# Patient Record
Sex: Male | Born: 1945
Health system: Southern US, Community
[De-identification: ages and names within clinical notes are randomized; demographics above are authoritative.]

## PROBLEM LIST (undated history)

## (undated) DIAGNOSIS — K750 Abscess of liver: Secondary | ICD-10-CM

## (undated) DIAGNOSIS — D649 Anemia, unspecified: Secondary | ICD-10-CM

## (undated) DIAGNOSIS — J189 Pneumonia, unspecified organism: Secondary | ICD-10-CM

## (undated) DIAGNOSIS — C801 Malignant (primary) neoplasm, unspecified: Secondary | ICD-10-CM

## (undated) DIAGNOSIS — K5720 Diverticulitis of large intestine with perforation and abscess without bleeding: Secondary | ICD-10-CM

## (undated) HISTORY — PX: TONSILLECTOMY: SUR1361

## (undated) SURGERY — OPEN REDUCTION INTERNAL FIXATION (ORIF) TIBIAL PLATEAU
Anesthesia: General | Laterality: Left

---

## 1999-03-20 ENCOUNTER — Ambulatory Visit (HOSPITAL_COMMUNITY): Admission: RE | Admit: 1999-03-20 | Discharge: 1999-03-20 | Payer: Self-pay | Admitting: Family Medicine

## 1999-10-14 ENCOUNTER — Encounter: Admission: RE | Admit: 1999-10-14 | Discharge: 2000-01-12 | Payer: Self-pay | Admitting: Family Medicine

## 2002-01-30 ENCOUNTER — Ambulatory Visit (HOSPITAL_COMMUNITY): Admission: RE | Admit: 2002-01-30 | Discharge: 2002-01-30 | Payer: Self-pay | Admitting: Gastroenterology

## 2010-04-20 DIAGNOSIS — C801 Malignant (primary) neoplasm, unspecified: Secondary | ICD-10-CM

## 2010-04-20 HISTORY — DX: Malignant (primary) neoplasm, unspecified: C80.1

## 2011-01-05 ENCOUNTER — Other Ambulatory Visit (HOSPITAL_COMMUNITY): Payer: Self-pay | Admitting: Urology

## 2011-01-05 DIAGNOSIS — C61 Malignant neoplasm of prostate: Secondary | ICD-10-CM

## 2011-01-26 ENCOUNTER — Ambulatory Visit (HOSPITAL_COMMUNITY)
Admission: RE | Admit: 2011-01-26 | Discharge: 2011-01-26 | Disposition: A | Discharge status: Home / Self Care | Payer: 59 | Source: Ambulatory Visit | Attending: Urology | Admitting: Urology

## 2011-01-26 DIAGNOSIS — C61 Malignant neoplasm of prostate: Secondary | ICD-10-CM

## 2011-01-26 LAB — CREATININE, SERUM: GFR calc non Af Amer: 68 mL/min — ABNORMAL LOW (ref >90)

## 2011-01-26 MED ORDER — GADOBENATE DIMEGLUMINE 529 MG/ML IV SOLN
20.0000 mL | Freq: Once | INTRAVENOUS | Status: AC | PRN
  Administered 2011-01-26: 18 mL via INTRAVENOUS

## 2011-02-05 ENCOUNTER — Encounter: Payer: 59 | Admitting: Oncology

## 2011-02-27 ENCOUNTER — Other Ambulatory Visit: Payer: Self-pay | Admitting: Urology

## 2011-03-27 ENCOUNTER — Encounter (HOSPITAL_COMMUNITY): Payer: Self-pay | Admitting: Pharmacy Technician

## 2011-03-31 ENCOUNTER — Encounter (HOSPITAL_COMMUNITY)
Admission: RE | Admit: 2011-03-31 | Discharge: 2011-03-31 | Disposition: A | Discharge status: Home / Self Care | Payer: Managed Care, Other (non HMO) | Source: Ambulatory Visit | Attending: Urology | Admitting: Urology

## 2011-03-31 ENCOUNTER — Encounter (HOSPITAL_COMMUNITY): Payer: Self-pay

## 2011-03-31 ENCOUNTER — Other Ambulatory Visit: Payer: Self-pay

## 2011-03-31 ENCOUNTER — Ambulatory Visit (HOSPITAL_COMMUNITY)
Admission: RE | Admit: 2011-03-31 | Discharge: 2011-03-31 | Disposition: A | Discharge status: Home / Self Care | Payer: Managed Care, Other (non HMO) | Source: Ambulatory Visit | Attending: Urology | Admitting: Urology

## 2011-03-31 DIAGNOSIS — Z01812 Encounter for preprocedural laboratory examination: Secondary | ICD-10-CM | POA: Insufficient documentation

## 2011-03-31 DIAGNOSIS — Z01810 Encounter for preprocedural cardiovascular examination: Secondary | ICD-10-CM | POA: Insufficient documentation

## 2011-03-31 DIAGNOSIS — Z01818 Encounter for other preprocedural examination: Secondary | ICD-10-CM | POA: Insufficient documentation

## 2011-03-31 HISTORY — DX: Malignant (primary) neoplasm, unspecified: C80.1

## 2011-03-31 LAB — CBC
HCT: 37.2 % — ABNORMAL LOW (ref 39.0–52.0)
Hemoglobin: 13 g/dL (ref 13.0–17.0)
MCH: 29.8 pg (ref 26.0–34.0)
MCHC: 34.9 g/dL (ref 30.0–36.0)
RDW: 13.1 % (ref 11.5–15.5)

## 2011-03-31 LAB — BASIC METABOLIC PANEL
BUN: 13 mg/dL (ref 6–23)
Chloride: 103 mEq/L (ref 96–112)
Creatinine, Ser: 1.06 mg/dL (ref 0.50–1.35)
Glucose, Bld: 186 mg/dL — ABNORMAL HIGH (ref 70–99)
Potassium: 4.7 mEq/L (ref 3.5–5.1)

## 2011-03-31 NOTE — Patient Instructions (Signed)
20 Billy Horn  03/31/2011   Your procedure is scheduled [id number] 04/06/11  AT 11:00 AM  Report to [facility] at 8:30 AM.  Call this number if you have problems the morning of surgery: [phone]   Remember:FOLLOW ANY INSTRUCTIONS FROM DR. BORDEN'S OFFICE ABOUT CLEAR LIQUID DIET AND LAXATIVE--DAY BEFORE YOUR SURGERY   Do not eat food OR DRINK ANYTHING--AFTER MIDNIGHT THE NIGHT BEFORE YOUR SURGERY       Do not wear jewelry, make-up or nail polish.  Do not wear lotions, powders, or perfumes. You may wear deodorant.  Do not shave 48 hours prior to surgery.  Do not bring valuables to the hospital.  Contacts, dentures or bridgework may not be worn into surgery.  Leave suitcase in the car. After surgery it may be brought to your room.  For patients admitted to the hospital, checkout time is 11:00 AM the day of discharge.   Patients discharged the day of surgery will not be allowed to drive home.    Special Instructions: CHG Shower Use Special Wash: 1/2 bottle night before surgery and 1/2 bottle morning of surgery.   Please read over the following fact sheets that you were given: Blood Transfusion Information and MRSA Information

## 2011-04-05 NOTE — H&P (Signed)
Chief Complaint  Prostate Cancer   History of Present Illness     Billy Horn is a 65 year old who was noted to have an elevated PSA of 8.06 which prompted a prostate biopsy by Dr. Patsi Sears on 12/24/10 which confirmed Gleason 3+4=7 adenocarcinoma with 4 out of 12 biopsy cores involved positive for malignancy. He underwent an MRI of the prostate which demonstrated findings worrisome for tumor at the right base and left mid gland with no evidence of extraprostatic extension and while there was some concern at the right seminal vesicle it is likely that these findings were related to post-biopsy hemmorrhage. He is well informed about his treatment options and is here to discuss surgical options for treatment. He has no family history of prostate cancer.   TNM stage: cT1c N0 Mx PSA: 8.06 Gleason score: 3+4=7 Biopsy (12/24/10): 4/12 cores -- L lateral apex (10%, 3+3=6), L lateral mid (15%, 3+4=7), L lateral base (70%, 3+4=7), R lateral apex (20%, 3+4=7) Prostate volume: 68 cc  Nomogram: OC disease: 70% EPE: 19% SVI: 6% LNI: 2.8% PFS (surgery): 91%, 87%  Urinary function: He has minimal voiding symptoms including only mild urinary frequency and nocturia which is not bothersome. IPSS: 4.  Erectile function: He is divorced and has been for 5 years and is not currently sexually active or in a current relationship. He does have moderate to severe erectile dysfunction. SHIM score: 11.   Past Medical History Problems  1. History of  Diabetes Mellitus 250.00   His diabetes is diet controlled.   Surgical History Problems  1. History of  Tonsillectomy  Current Meds 1. Adult Aspirin Low Strength 81 MG CHEW; Therapy: (Recorded:03Jan2012) to 2. Centrum Silver Oral Tablet; Therapy: (Recorded:03Jan2012) to 3. Metamucil CAPS; Therapy: (Recorded:14Sep2012) to 4. Viagra 100 MG Oral Tablet; Therapy: 18Jul2012 to  Allergies Medication  1. No Known Drug Allergies  Family History Problems  1. Family  history of  Lung Cancer V16.1 2. Family history of  Mother Deceased At Age ____ 74/Lung Cancer  Social History Problems    Alcohol Use Wine/4 glasses weekly   Marital History - Single   Never A Smoker   Occupation: Hydrologist  Review of Systems Constitutional, skin, eye, otolaryngeal, hematologic/lymphatic, cardiovascular, pulmonary, endocrine, musculoskeletal, gastrointestinal, neurological and psychiatric system(s) were reviewed and pertinent findings if present are noted.    Vitals  Blood Pressure: 160 / 91 Temperature: 97.8 F Heart Rate: 70  Physical Exam Constitutional: Well nourished and well developed . No acute distress.  ENT:. The ears and nose are normal in appearance.  Neck: The appearance of the neck is normal and no neck mass is present.  Pulmonary: No respiratory distress, normal respiratory rhythm and effort and clear bilateral breath sounds.  Cardiovascular: Heart rate and rhythm are normal . No peripheral edema.  Abdomen: The abdomen is mildly obese. The abdomen is soft and nontender. No masses are palpated. No CVA tenderness. No hernias are palpable. No hepatosplenomegaly noted.  Rectal: Rectal exam demonstrates normal sphincter tone, no tenderness and no masses. Prostate size is estimated to be 50 g. The prostate has no nodularity and is not tender. The left seminal vesicle is nonpalpable. The right seminal vesicle is nonpalpable. The perineum is normal on inspection.  Lymphatics: The femoral and inguinal nodes are not enlarged or tender.  Skin: Normal skin turgor, no visible rash and no visible skin lesions.  Neuro/Psych:. Mood and affect are appropriate.    Results/Data Urine [Data Includes: Last 1 Day]  06Nov2012  COLOR: YELLOW  Reference Range YELLOW APPEARANCE: CLEAR  Reference Range CLEAR SPECIFIC GRAVITY: 1.025  Reference Range 1.005-1.030 pH: 5.5  Reference Range 5.0-8.0 GLUCOSE: NEG mg/dL Reference Range NEG BILIRUBIN: NEG  Reference  Range NEG KETONE: NEG mg/dL Reference Range NEG BLOOD: NEG  Reference Range NEG PROTEIN: NEG mg/dL Reference Range NEG UROBILINOGEN: 0.2 mg/dL Reference Range 6.2-9.5 NITRITE: NEG  Reference Range NEG LEUKOCYTE ESTERASE: NEG  Reference Range NEG   I reviewed his medical records, pathology report, and PSA results. I also independently reviewed his MRI findings as dictated above.   Assessment Assessed  1. Prostate Cancer 185  Plan   1. Prostate cancer:  He will undergo a robotic prostatectomy and BPLND.

## 2011-04-06 ENCOUNTER — Encounter (HOSPITAL_COMMUNITY): Payer: Self-pay | Admitting: *Deleted

## 2011-04-06 ENCOUNTER — Encounter (HOSPITAL_COMMUNITY)
Admission: RE | Disposition: A | Discharge status: Home / Self Care | Payer: Self-pay | Source: Ambulatory Visit | Attending: Urology

## 2011-04-06 ENCOUNTER — Other Ambulatory Visit: Payer: Self-pay | Admitting: Urology

## 2011-04-06 ENCOUNTER — Inpatient Hospital Stay (HOSPITAL_COMMUNITY): Admission: RE | Admit: 2011-04-06 | Payer: Self-pay | Source: Ambulatory Visit

## 2011-04-06 ENCOUNTER — Encounter (HOSPITAL_COMMUNITY): Payer: Self-pay | Admitting: Anesthesiology

## 2011-04-06 ENCOUNTER — Inpatient Hospital Stay (HOSPITAL_COMMUNITY)
Admission: RE | Admit: 2011-04-06 | Discharge: 2011-04-07 | DRG: 708 | Disposition: A | Discharge status: Home / Self Care | Payer: Managed Care, Other (non HMO) | Source: Ambulatory Visit | Attending: Urology | Admitting: Urology

## 2011-04-06 ENCOUNTER — Inpatient Hospital Stay (HOSPITAL_COMMUNITY): Payer: Managed Care, Other (non HMO) | Admitting: Anesthesiology

## 2011-04-06 DIAGNOSIS — E119 Type 2 diabetes mellitus without complications: Secondary | ICD-10-CM | POA: Diagnosis present

## 2011-04-06 DIAGNOSIS — C61 Malignant neoplasm of prostate: Principal | ICD-10-CM | POA: Diagnosis present

## 2011-04-06 HISTORY — PX: ROBOT ASSISTED LAPAROSCOPIC RADICAL PROSTATECTOMY: SHX5141

## 2011-04-06 LAB — GLUCOSE, CAPILLARY
Glucose-Capillary: 111 mg/dL — ABNORMAL HIGH (ref 70–99)
Glucose-Capillary: 203 mg/dL — ABNORMAL HIGH (ref 70–99)
Glucose-Capillary: 286 mg/dL — ABNORMAL HIGH (ref 70–99)

## 2011-04-06 SURGERY — ROBOTIC ASSISTED LAPAROSCOPIC RADICAL PROSTATECTOMY LEVEL 2
Anesthesia: General | Site: Abdomen | Wound class: Clean Contaminated

## 2011-04-06 MED ORDER — HYDROMORPHONE HCL PF 1 MG/ML IJ SOLN
INTRAMUSCULAR | Status: DC | PRN
  Administered 2011-04-06 (×2): 1 mg via INTRAVENOUS

## 2011-04-06 MED ORDER — FENTANYL CITRATE 0.05 MG/ML IJ SOLN
INTRAMUSCULAR | Status: DC | PRN
  Administered 2011-04-06 (×2): 200 ug via INTRAVENOUS
  Administered 2011-04-06: 100 ug via INTRAVENOUS
  Administered 2011-04-06: 50 ug via INTRAVENOUS
  Administered 2011-04-06 (×2): 100 ug via INTRAVENOUS

## 2011-04-06 MED ORDER — INDIGOTINDISULFONATE SODIUM 8 MG/ML IJ SOLN
INTRAMUSCULAR | Status: DC | PRN
  Administered 2011-04-06 (×2): 40 mg via INTRAVENOUS

## 2011-04-06 MED ORDER — ACETAMINOPHEN 325 MG PO TABS: 650.0000 mg | ORAL_TABLET | ORAL | Status: DC | PRN

## 2011-04-06 MED ORDER — ROCURONIUM BROMIDE 100 MG/10ML IV SOLN
INTRAVENOUS | Status: DC | PRN
  Administered 2011-04-06: 70 mg via INTRAVENOUS

## 2011-04-06 MED ORDER — DIPHENHYDRAMINE HCL 50 MG/ML IJ SOLN: 12.5000 mg | Freq: Four times a day (QID) | INTRAMUSCULAR | Status: DC | PRN

## 2011-04-06 MED ORDER — CEFAZOLIN SODIUM-DEXTROSE 2-3 GM-% IV SOLR
2.0000 g | Freq: Once | INTRAVENOUS | Status: AC
  Administered 2011-04-06: 2 g via INTRAVENOUS

## 2011-04-06 MED ORDER — KCL IN DEXTROSE-NACL 20-5-0.45 MEQ/L-%-% IV SOLN
INTRAVENOUS | Status: DC
  Administered 2011-04-06 – 2011-04-07 (×3): via INTRAVENOUS
  Filled 2011-04-06 (×5): qty 1000

## 2011-04-06 MED ORDER — DIPHENHYDRAMINE HCL 12.5 MG/5ML PO ELIX: 12.5000 mg | ORAL_SOLUTION | Freq: Four times a day (QID) | ORAL | Status: DC | PRN

## 2011-04-06 MED ORDER — CEFAZOLIN SODIUM 1-5 GM-% IV SOLN
1.0000 g | Freq: Three times a day (TID) | INTRAVENOUS | Status: AC
  Administered 2011-04-06 – 2011-04-07 (×2): 1 g via INTRAVENOUS
  Filled 2011-04-06 (×2): qty 50

## 2011-04-06 MED ORDER — STERILE WATER FOR IRRIGATION IR SOLN
Status: DC | PRN
  Administered 2011-04-06: 3000 mL

## 2011-04-06 MED ORDER — SODIUM CHLORIDE 0.9 % IR SOLN
Status: DC | PRN
  Administered 2011-04-06: 250 mL

## 2011-04-06 MED ORDER — KETOROLAC TROMETHAMINE 30 MG/ML IJ SOLN
INTRAMUSCULAR | Status: DC | PRN
  Administered 2011-04-06: 30 mg via INTRAVENOUS

## 2011-04-06 MED ORDER — PROPOFOL 10 MG/ML IV EMUL
INTRAVENOUS | Status: DC | PRN
  Administered 2011-04-06: 30 mg via INTRAVENOUS
  Administered 2011-04-06: 200 mg via INTRAVENOUS

## 2011-04-06 MED ORDER — LACTATED RINGERS IV SOLN
INTRAVENOUS | Status: AC
  Administered 2011-04-06 (×2): via INTRAVENOUS
  Administered 2011-04-06: 1000 mL via INTRAVENOUS

## 2011-04-06 MED ORDER — LACTATED RINGERS IV SOLN
INTRAVENOUS | Status: DC | PRN
  Administered 2011-04-06: 12:00:00

## 2011-04-06 MED ORDER — GLYCOPYRROLATE 0.2 MG/ML IJ SOLN
INTRAMUSCULAR | Status: DC | PRN
  Administered 2011-04-06: .8 mg via INTRAVENOUS

## 2011-04-06 MED ORDER — SODIUM CHLORIDE 0.9 % IV BOLUS (SEPSIS)
1000.0000 mL | Freq: Once | INTRAVENOUS | Status: AC
  Administered 2011-04-06: 1000 mL via INTRAVENOUS

## 2011-04-06 MED ORDER — INSULIN ASPART 100 UNIT/ML ~~LOC~~ SOLN
0.0000 [IU] | SUBCUTANEOUS | Status: DC
  Administered 2011-04-06: 8 [IU] via SUBCUTANEOUS
  Administered 2011-04-06: 5 [IU] via SUBCUTANEOUS
  Administered 2011-04-07 (×3): 3 [IU] via SUBCUTANEOUS
  Administered 2011-04-07: 5 [IU] via SUBCUTANEOUS
  Filled 2011-04-06: qty 3

## 2011-04-06 MED ORDER — BUPIVACAINE-EPINEPHRINE 0.25% -1:200000 IJ SOLN
INTRAMUSCULAR | Status: DC | PRN
  Administered 2011-04-06: 30 mL

## 2011-04-06 MED ORDER — HYDROCODONE-ACETAMINOPHEN 5-325 MG PO TABS: 1.0000 | ORAL_TABLET | Freq: Four times a day (QID) | ORAL | Status: AC | PRN

## 2011-04-06 MED ORDER — ONDANSETRON HCL 4 MG/2ML IJ SOLN: 4.0000 mg | INTRAMUSCULAR | Status: DC | PRN

## 2011-04-06 MED ORDER — HYDROMORPHONE HCL PF 1 MG/ML IJ SOLN
0.2500 mg | INTRAMUSCULAR | Status: DC | PRN
  Administered 2011-04-06 (×2): 0.5 mg via INTRAVENOUS

## 2011-04-06 MED ORDER — NEOSTIGMINE METHYLSULFATE 1 MG/ML IJ SOLN
INTRAMUSCULAR | Status: DC | PRN
  Administered 2011-04-06: 5 mg via INTRAVENOUS

## 2011-04-06 MED ORDER — KETOROLAC TROMETHAMINE 15 MG/ML IJ SOLN
15.0000 mg | Freq: Four times a day (QID) | INTRAMUSCULAR | Status: DC
  Administered 2011-04-06 – 2011-04-07 (×3): 15 mg via INTRAVENOUS
  Filled 2011-04-06 (×7): qty 1

## 2011-04-06 MED ORDER — CIPROFLOXACIN HCL 500 MG PO TABS: 500.0000 mg | ORAL_TABLET | Freq: Two times a day (BID) | ORAL | Status: AC

## 2011-04-06 MED ORDER — LIDOCAINE HCL (CARDIAC) 20 MG/ML IV SOLN
INTRAVENOUS | Status: DC | PRN
  Administered 2011-04-06: 100 mg via INTRAVENOUS

## 2011-04-06 MED ORDER — MORPHINE SULFATE 2 MG/ML IJ SOLN: 2.0000 mg | INTRAMUSCULAR | Status: DC | PRN

## 2011-04-06 MED ORDER — ONDANSETRON HCL 4 MG/2ML IJ SOLN
INTRAMUSCULAR | Status: DC | PRN
  Administered 2011-04-06: 4 mg via INTRAVENOUS

## 2011-04-06 MED ORDER — ACETAMINOPHEN 10 MG/ML IV SOLN
INTRAVENOUS | Status: DC | PRN
  Administered 2011-04-06: 1000 mg via INTRAVENOUS

## 2011-04-06 MED ORDER — LABETALOL HCL 5 MG/ML IV SOLN
INTRAVENOUS | Status: DC | PRN
  Administered 2011-04-06 (×3): 5 mg via INTRAVENOUS

## 2011-04-06 MED ORDER — MIDAZOLAM HCL 5 MG/5ML IJ SOLN
INTRAMUSCULAR | Status: DC | PRN
  Administered 2011-04-06: 2 mg via INTRAVENOUS
  Administered 2011-04-06: 1 mg via INTRAVENOUS

## 2011-04-06 MED ORDER — BACITRACIN-NEOMYCIN-POLYMYXIN 400-5-5000 EX OINT: 1.0000 "application " | TOPICAL_OINTMENT | Freq: Three times a day (TID) | CUTANEOUS | Status: DC | PRN

## 2011-04-06 MED ORDER — ZOLPIDEM TARTRATE 5 MG PO TABS: 5.0000 mg | ORAL_TABLET | Freq: Every evening | ORAL | Status: DC | PRN

## 2011-04-06 MED ORDER — DOCUSATE SODIUM 100 MG PO CAPS
100.0000 mg | ORAL_CAPSULE | Freq: Two times a day (BID) | ORAL | Status: DC
  Administered 2011-04-06 – 2011-04-07 (×2): 100 mg via ORAL
  Filled 2011-04-06 (×4): qty 1

## 2011-04-06 MED ORDER — PROMETHAZINE HCL 25 MG/ML IJ SOLN: 6.2500 mg | INTRAMUSCULAR | Status: DC | PRN

## 2011-04-06 SURGICAL SUPPLY — 34 items
CANISTER SUCTION 2500CC (MISCELLANEOUS) ×2 IMPLANT
CATH ROBINSON RED A/P 8FR (CATHETERS) ×2 IMPLANT
CHLORAPREP W/TINT 26ML (MISCELLANEOUS) ×2 IMPLANT
CLIP LIGATING HEM O LOK PURPLE (MISCELLANEOUS) ×4 IMPLANT
CLOTH BEACON ORANGE TIMEOUT ST (SAFETY) ×2 IMPLANT
CORD HIGH FREQUENCY UNIPOLAR (ELECTROSURGICAL) ×2 IMPLANT
COVER SURGICAL LIGHT HANDLE (MISCELLANEOUS) ×2 IMPLANT
COVER TIP SHEARS 8 DVNC (MISCELLANEOUS) ×1 IMPLANT
COVER TIP SHEARS 8MM DA VINCI (MISCELLANEOUS) ×1
CUTTER ECHEON FLEX ENDO 45 340 (ENDOMECHANICALS) ×2 IMPLANT
DECANTER SPIKE VIAL GLASS SM (MISCELLANEOUS) IMPLANT
DRAPE SURG IRRIG POUCH 19X23 (DRAPES) ×2 IMPLANT
DRSG TEGADERM 6X8 (GAUZE/BANDAGES/DRESSINGS) ×4 IMPLANT
ELECT REM PT RETURN 9FT ADLT (ELECTROSURGICAL) ×2
ELECTRODE REM PT RTRN 9FT ADLT (ELECTROSURGICAL) ×1 IMPLANT
GLOVE BIO SURGEON STRL SZ 6.5 (GLOVE) ×2 IMPLANT
GLOVE BIOGEL M STRL SZ7.5 (GLOVE) ×4 IMPLANT
GOWN STRL NON-REIN LRG LVL3 (GOWN DISPOSABLE) ×6 IMPLANT
HOLDER FOLEY CATH W/STRAP (MISCELLANEOUS) ×2 IMPLANT
IV LACTATED RINGERS 1000ML (IV SOLUTION) ×2 IMPLANT
KIT ACCESSORY DA VINCI DISP (KITS) ×1
KIT ACCESSORY DVNC DISP (KITS) ×1 IMPLANT
NDL SAFETY ECLIPSE 18X1.5 (NEEDLE) ×1 IMPLANT
NEEDLE HYPO 18GX1.5 SHARP (NEEDLE) ×1
PACK ROBOT UROLOGY CUSTOM (CUSTOM PROCEDURE TRAY) ×2 IMPLANT
POSITIONER SURGICAL ARM (MISCELLANEOUS) IMPLANT
RELOAD GREEN ECHELON 45 (STAPLE) ×2 IMPLANT
SET TUBE IRRIG SUCTION NO TIP (IRRIGATION / IRRIGATOR) ×2 IMPLANT
SOLUTION ELECTROLUBE (MISCELLANEOUS) ×2 IMPLANT
SPONGE GAUZE 4X4 12PLY (GAUZE/BANDAGES/DRESSINGS) ×2 IMPLANT
SUT VICRYL 0 UR6 27IN ABS (SUTURE) ×4 IMPLANT
SYR 27GX1/2 1ML LL SAFETY (SYRINGE) ×2 IMPLANT
TOWEL OR NON WOVEN STRL DISP B (DISPOSABLE) ×2 IMPLANT
WATER STERILE IRR 1500ML POUR (IV SOLUTION) ×4 IMPLANT

## 2011-04-06 NOTE — Anesthesia Postprocedure Evaluation (Signed)
  Anesthesia Post-op Note  Patient: Billy Horn  Procedure(s) Performed:  ROBOTIC ASSISTED LAPAROSCOPIC RADICAL PROSTATECTOMY LEVEL 2 - Bilateral Pelvic Lymphadenectomy   Patient Location: PACU  Anesthesia Type: General  Level of Consciousness: oriented and sedated  Airway and Oxygen Therapy: Patient Spontanous Breathing and Patient connected to nasal cannula oxygen  Post-op Pain: mild  Post-op Assessment: Post-op Vital signs reviewed, Patient's Cardiovascular Status Stable, Respiratory Function Stable and Patent Airway  Post-op Vital Signs: stable  Complications: No apparent anesthesia complications

## 2011-04-06 NOTE — Anesthesia Preprocedure Evaluation (Signed)
Anesthesia Evaluation  Patient identified by MRN, date of birth, ID band Patient awake    Reviewed: Allergy & Precautions, H&P , NPO status , Patient's Chart, lab work & pertinent test results, reviewed documented beta blocker date and time   Airway Mallampati: II TM Distance: >3 FB Neck ROM: Full    Dental   Pulmonary neg pulmonary ROS,  clear to auscultation        Cardiovascular neg cardio ROS Regular Normal Denies cardiac symptoms   Neuro/Psych Negative Neurological ROS  Negative Psych ROS   GI/Hepatic negative GI ROS, Neg liver ROS,   Endo/Other  Diabetes mellitus-, Type 2Diet controlled  Renal/GU negative Renal ROS   Prostate Cancer    Musculoskeletal negative musculoskeletal ROS (+)   Abdominal   Peds negative pediatric ROS (+)  Hematology negative hematology ROS (+)   Anesthesia Other Findings   Reproductive/Obstetrics negative OB ROS                           Anesthesia Physical Anesthesia Plan  ASA: II  Anesthesia Plan: General   Post-op Pain Management:    Induction: Intravenous  Airway Management Planned: Oral ETT  Additional Equipment:   Intra-op Plan:   Post-operative Plan: Extubation in OR  Informed Consent: I have reviewed the patients History and Physical, chart, labs and discussed the procedure including the risks, benefits and alternatives for the proposed anesthesia with the patient or authorized representative who has indicated his/her understanding and acceptance.     Plan Discussed with: CRNA and Surgeon  Anesthesia Plan Comments:         Anesthesia Quick Evaluation

## 2011-04-06 NOTE — Transfer of Care (Signed)
Immediate Anesthesia Transfer of Care Note  Patient: Billy Horn  Procedure(s) Performed:  ROBOTIC ASSISTED LAPAROSCOPIC RADICAL PROSTATECTOMY LEVEL 2 - Bilateral Pelvic Lymphadenectomy   Patient Location: PACU  Anesthesia Type: General  Level of Consciousness: awake, alert , oriented and patient cooperative  Airway & Oxygen Therapy: Patient Spontanous Breathing and Patient connected to face mask oxygen  Post-op Assessment: Report given to PACU RN and Post -op Vital signs reviewed and stable  Post vital signs: stable  Complications: No apparent anesthesia complications

## 2011-04-06 NOTE — Op Note (Signed)
Preoperative diagnosis: Clinically localized adenocarcinoma of the prostate (clinical stage cT1c Nx Mx)  Postoperative diagnosis: Clinically localized adenocarcinoma of the prostate (clinical stage cT1c Nx Mx)  Procedure:  1. Robotic assisted laparoscopic radical prostatectomy (bilateral nerve sparing) 2. Bilateral robotic assisted laparoscopic pelvic lymphadenectomy  Surgeon: Moody Bruins. M.D.  Assistant: Pecola Leisure, PA  Anesthesia: General  Complications: None  EBL: 50 mL  IVF:  2000 mL crystalloid  Specimens: 1. Prostate and seminal vesicles 2. Right pelvic lymph nodes 3. Left pelvic lymph nodes  Disposition of specimens: Pathology  Drains: 1. 20 Fr coude catheter 2. # 19 Blake pelvic drain  Indication: Billy Horn is a 65 y.o. year old patient with clinically localized prostate cancer.  After a thorough review of the management options for treatment of prostate cancer, he elected to proceed with surgical therapy and the above procedure(s).  We have discussed the potential benefits and risks of the procedure, side effects of the proposed treatment, the likelihood of the patient achieving the goals of the procedure, and any potential problems that might occur during the procedure or recuperation. Informed consent has been obtained.  Description of procedure:  The patient was taken to the operating room and a general anesthetic was administered. He was given preoperative antibiotics, placed in the dorsal lithotomy position, and prepped and draped in the usual sterile fashion. Next a preoperative timeout was performed. A urethral catheter was placed into the bladder and a site was selected near the umbilicus for placement of the camera port. This was placed using a standard open Hassan technique which allowed entry into the peritoneal cavity under direct vision and without difficulty. A 12 mm port was placed and a pneumoperitoneum established. The camera was then  used to inspect the abdomen and there was no evidence of any intra-abdominal injuries or other abnormalities. The remaining abdominal ports were then placed. 8 mm robotic ports were placed in the right lower quadrant, left lower quadrant, and far left lateral abdominal wall. A 5 mm port was placed in the right upper quadrant and a 12 mm port was placed in the right lateral abdominal wall for laparoscopic assistance. All ports were placed under direct vision without difficulty. The surgical cart was then docked.   Utilizing the cautery scissors, the bladder was reflected posteriorly allowing entry into the space of Retzius and identification of the endopelvic fascia and prostate. The periprostatic fat was then removed from the prostate allowing full exposure of the endopelvic fascia. The endopelvic fascia was then incised from the apex back to the base of the prostate bilaterally and the underlying levator muscle fibers were swept laterally off the prostate thereby isolating the dorsal venous complex. The dorsal vein was then stapled and divided with a 45 mm Flex Echelon stapler. Attention then turned to the bladder neck which was divided anteriorly thereby allowing entry into the bladder and exposure of the urethral catheter. The catheter balloon was deflated and the catheter was brought into the operative field and used to retract the prostate anteriorly. The posterior bladder neck was then examined and was divided allowing further dissection between the bladder and prostate posteriorly until the vasa deferentia and seminal vessels were identified. The vasa deferentia were isolated, divided, and lifted anteriorly. The seminal vesicles were dissected down to their tips with care to control the seminal vascular arterial blood supply. These structures were then lifted anteriorly and the space between Denonvillier's fascia and the anterior rectum was developed with a combination  of sharp and blunt dissection. This  isolated the vascular pedicles of the prostate.  The lateral prostatic fascia was then sharply incised allowing release of the neurovascular bundles bilaterally. The vascular pedicles of the prostate were then ligated with Weck clips between the prostate and neurovascular bundles and divided with sharp cold scissor dissection resulting in neurovascular bundle preservation. The neurovascular bundles were then separated off the apex of the prostate and urethra bilaterally.  The urethra was then sharply transected allowing the prostate specimen to be disarticulated. The pelvis was copiously irrigated and hemostasis was ensured. There was no evidence for rectal injury.  Attention then turned to the right pelvic sidewall. The fibrofatty tissue between the external iliac vein, confluence of the iliac vessels, hypogastric artery, and Cooper's ligament was dissected free from the pelvic sidewall with care to preserve the obturator nerve. Weck clips were used for lymphostasis and hemostasis. An identical procedure was performed on the contralateral side and the lymphatic packets were removed for permanent pathologic analysis.  Attention then turned to the urethral anastomosis. A 2-0 Vicryl slip knot was placed between Denonvillier's fascia, the posterior bladder neck, and the posterior urethra to reapproximate these structures. A double-armed 3-0 Monocryl suture was then used to perform a 360 running tension-free anastomosis between the bladder neck and urethra. A new urethral catheter was then placed into the bladder and irrigated. There were no blood clots within the bladder and the anastomosis appeared to be watertight. A #19 Blake drain was then brought through the left lateral 8 mm port site and positioned appropriately within the pelvis. It was secured to the skin with a nylon suture. The surgical cart was then undocked. The right lateral 12 mm port site was closed at the fascial level with a 0 Vicryl suture  placed laparoscopically. All remaining ports were then removed under direct vision. The prostate specimen was removed intact within the Endopouch retrieval bag via the periumbilical camera port site. This fascial opening was closed with two running 0 Vicryl sutures. 0.25% Marcaine was then injected into all port sites and all incisions were reapproximated at the skin level with staples. Sterile dressings were applied. The patient appeared to tolerate the procedure well and without complications. The patient was able to be extubated and transferred to the recovery unit in satisfactory condition.   Moody Bruins MD

## 2011-04-06 NOTE — Progress Notes (Signed)
Patient ID: Billy Horn, male   DOB: 08/18/1945, 65 y.o.   MRN: [id number]  Day of Surgery Subjective: The patient is doing well.  No nausea or vomiting. Pain is adequately controlled.  Objective: Vital signs in last 24 hours: Temp:  [97 F (36.1 C)-98.1 F (36.7 C)] 98.1 F (36.7 C) (12/17 1620) Pulse Rate:  [47-82] 54  (12/17 1620) Resp:  [7-18] 16  (12/17 1620) BP: (140-165)/(57-87) 146/72 mmHg (12/17 1620) SpO2:  [95 %-100 %] 95 % (12/17 1620)  Intake/Output from previous day:   Intake/Output this shift: Total I/O In: 3870 [I.V.:2800; Other:70; IV Piggyback:1000] Out: 500 [Urine:400; Drains:50; Blood:50]  Physical Exam:  General: Alert and oriented. CV: RRR Lungs: Clear bilaterally. GI: Soft, Nondistended. Incisions: Dressings intact. Urine: Clear Extremities: Nontender, no erythema, no edema.  Lab Results:  Basename 04/06/11 1404  HGB 11.9*  HCT 34.0*      Assessment/Plan: POD# 1 s/p robotic prostatectomy.  1) SL IVF 2) Ambulate, Incentive spirometry 3) Transition to oral pain medication 4) Dulcolax suppository 5) D/C pelvic drain 6) Plan for likely discharge later today   Moody Bruins. MD   LOS: 0 days   Odas Ozer,LES 04/06/2011, 5:49 PM

## 2011-04-07 LAB — GLUCOSE, CAPILLARY
Glucose-Capillary: 179 mg/dL — ABNORMAL HIGH (ref 70–99)
Glucose-Capillary: 184 mg/dL — ABNORMAL HIGH (ref 70–99)

## 2011-04-07 LAB — HEMOGLOBIN AND HEMATOCRIT, BLOOD: HCT: 31.7 % — ABNORMAL LOW (ref 39.0–52.0)

## 2011-04-07 MED ORDER — BISACODYL 10 MG RE SUPP
10.0000 mg | Freq: Once | RECTAL | Status: AC
  Administered 2011-04-07: 10 mg via RECTAL
  Filled 2011-04-07: qty 1

## 2011-04-07 MED ORDER — HYDROCODONE-ACETAMINOPHEN 5-325 MG PO TABS: 1.0000 | ORAL_TABLET | Freq: Four times a day (QID) | ORAL | Status: DC | PRN

## 2011-04-07 NOTE — Progress Notes (Signed)
Patient Billy Horn was removed intact with minimal drainage. Went over discharge teaching with patient regarding how to transfer from the leg bag to the overnight bag. Patient did a return demonstration and feels comfortable going home with the catheter. Daris Aristizabal, Joslyn Devon

## 2011-04-07 NOTE — Progress Notes (Signed)
Brief visit with patient.  Discussed CBG elevations post-op which are not unusual.  Advised patient to contact his PCP if he has difficulty controlling his blood glucose after discharge.  Patient says he will.  Patient seems very diligent about his diabetes management.

## 2011-04-07 NOTE — Progress Notes (Signed)
Patient ID: Billy Horn, male   DOB: [birth year], 65 y.o.   MRN: [id number]  1 Day Post-Op Subjective: The patient is doing well.  No nausea or vomiting. Pain is adequately controlled.  Objective: Vital signs in last 24 hours: Temp:  [97 F (36.1 C)-98.3 F (36.8 C)] 97.8 F (36.6 C) (12/18 0505) Pulse Rate:  [47-82] 57  (12/18 0505) Resp:  [7-18] 16  (12/18 0505) BP: (130-165)/(57-87) 135/71 mmHg (12/18 0505) SpO2:  [95 %-100 %] 97 % (12/18 0505) Weight:  [87.091 kg (192 lb)-90.7 kg (199 lb 15.3 oz)] 199 lb 15.3 oz (90.7 kg) (12/17 2200)  Intake/Output from previous day: 12/17 0701 - 12/18 0700 In: 6640 [P.O.:240; I.V.:5230; IV Piggyback:1100] Out: 2820 [Urine:2600; Drains:170; Blood:50] Intake/Output this shift:    Physical Exam:  General: Alert and oriented. CV: RRR Lungs: Clear bilaterally. GI: Soft, Nondistended. Incisions: Dressings intact. Urine: Clear Extremities: Nontender, no erythema, no edema.  Lab Results:  Basename 04/07/11 0451 04/06/11 1404  HGB 11.3* 11.9*  HCT 31.7* 34.0*      Assessment/Plan: POD# 1 s/p robotic prostatectomy.  1) SL IVF 2) Ambulate, Incentive spirometry 3) Transition to oral pain medication 4) Dulcolax suppository 5) D/C pelvic drain 6) Plan for likely discharge later today   Moody Bruins. MD   LOS: 1 day   Brenen Beigel,LES 04/07/2011, 7:18 AM

## 2011-04-07 NOTE — Discharge Summary (Signed)
  Date of admission: 04/06/2011  Date of discharge: 04/07/2011  Admission diagnosis: Prostate Cancer  Discharge diagnosis: Prostate Cancer  History and Physical: For full details, please see admission history and physical. Briefly, Billy Horn is a 65 y.o. gentleman with localized prostate cancer.  After discussing management/treatment options, he elected to proceed with surgical treatment.  Hospital Course: Billy Horn was taken to the operating room on 04/06/2011 and underwent a robotic assisted laparoscopic radical prostatectomy. He tolerated this procedure well and without complications. Postoperatively, he was able to be transferred to a regular hospital room following recovery from anesthesia.  He was able to begin ambulating the night of surgery. He remained hemodynamically stable overnight.  He had excellent urine output with appropriately minimal output from his pelvic drain and his pelvic drain was removed on POD #1.  He was transitioned to oral pain medication, tolerated a clear liquid diet, and had met all discharge criteria and was able to be discharged home later on POD#1.  Laboratory values:  Basename 04/07/11 0451 04/06/11 1404  HGB 11.3* 11.9*  HCT 31.7* 34.0*    Disposition: Home  Discharge instruction: He was instructed to be ambulatory but to refrain from heavy lifting, strenuous activity, or driving. He was instructed on urethral catheter care.  Discharge medications:   Medication List  As of 04/07/2011  1:42 PM   START taking these medications         ciprofloxacin 500 MG tablet   Commonly known as: CIPRO   Take 1 tablet (500 mg total) by mouth 2 (two) times daily. Start day prior to office visit for foley removal      HYDROcodone-acetaminophen 5-325 MG per tablet   Commonly known as: NORCO   Take 1-2 tablets by mouth every 6 (six) hours as needed for pain.         CONTINUE taking these medications         METAMUCIL 0.52 G capsule   Generic drug:  psyllium         STOP taking these medications         aspirin EC 81 MG tablet      multivitamins ther. w/minerals Tabs          Where to get your medications    These are the prescriptions that you need to pick up.   You may get these medications from any pharmacy.         ciprofloxacin 500 MG tablet   HYDROcodone-acetaminophen 5-325 MG per tablet            Followup: He will followup in 1 week for catheter removal and to discuss his surgical pathology results.

## 2011-04-09 ENCOUNTER — Encounter (HOSPITAL_COMMUNITY): Payer: Self-pay | Admitting: Urology

## 2012-07-23 ENCOUNTER — Inpatient Hospital Stay (HOSPITAL_COMMUNITY)
Admission: EM | Admit: 2012-07-23 | Discharge: 2012-07-27 | DRG: 494 | Disposition: A | Discharge status: Home Health | Payer: Managed Care, Other (non HMO) | Attending: Orthopedic Surgery | Admitting: Orthopedic Surgery

## 2012-07-23 ENCOUNTER — Emergency Department (HOSPITAL_COMMUNITY): Payer: Managed Care, Other (non HMO) | Admitting: Anesthesiology

## 2012-07-23 ENCOUNTER — Encounter (HOSPITAL_COMMUNITY): Payer: Self-pay | Admitting: Anesthesiology

## 2012-07-23 ENCOUNTER — Other Ambulatory Visit: Payer: Self-pay | Admitting: Orthopedic Surgery

## 2012-07-23 ENCOUNTER — Emergency Department (HOSPITAL_COMMUNITY): Payer: Managed Care, Other (non HMO)

## 2012-07-23 ENCOUNTER — Encounter (HOSPITAL_COMMUNITY)
Admission: EM | Disposition: A | Discharge status: Home Health | Payer: Self-pay | Source: Home / Self Care | Attending: Orthopedic Surgery

## 2012-07-23 DIAGNOSIS — S82109A Unspecified fracture of upper end of unspecified tibia, initial encounter for closed fracture: Secondary | ICD-10-CM | POA: Diagnosis present

## 2012-07-23 DIAGNOSIS — R5082 Postprocedural fever: Secondary | ICD-10-CM | POA: Diagnosis not present

## 2012-07-23 DIAGNOSIS — H9190 Unspecified hearing loss, unspecified ear: Secondary | ICD-10-CM | POA: Diagnosis present

## 2012-07-23 DIAGNOSIS — Z8546 Personal history of malignant neoplasm of prostate: Secondary | ICD-10-CM

## 2012-07-23 DIAGNOSIS — Z9089 Acquired absence of other organs: Secondary | ICD-10-CM

## 2012-07-23 DIAGNOSIS — Y92009 Unspecified place in unspecified non-institutional (private) residence as the place of occurrence of the external cause: Secondary | ICD-10-CM

## 2012-07-23 DIAGNOSIS — E119 Type 2 diabetes mellitus without complications: Secondary | ICD-10-CM | POA: Diagnosis present

## 2012-07-23 DIAGNOSIS — Z79899 Other long term (current) drug therapy: Secondary | ICD-10-CM

## 2012-07-23 DIAGNOSIS — Z9079 Acquired absence of other genital organ(s): Secondary | ICD-10-CM

## 2012-07-23 DIAGNOSIS — Z7982 Long term (current) use of aspirin: Secondary | ICD-10-CM | POA: Diagnosis not present

## 2012-07-23 DIAGNOSIS — W108XXA Fall (on) (from) other stairs and steps, initial encounter: Secondary | ICD-10-CM | POA: Diagnosis present

## 2012-07-23 DIAGNOSIS — S82142A Displaced bicondylar fracture of left tibia, initial encounter for closed fracture: Secondary | ICD-10-CM

## 2012-07-23 HISTORY — PX: ORIF TIBIA PLATEAU: SHX2132

## 2012-07-23 LAB — COMPREHENSIVE METABOLIC PANEL
ALT: 18 U/L (ref 0–53)
AST: 27 U/L (ref 0–37)
Albumin: 3.5 g/dL (ref 3.5–5.2)
Alkaline Phosphatase: 88 U/L (ref 39–117)
Chloride: 103 mEq/L (ref 96–112)
Potassium: 4 mEq/L (ref 3.5–5.1)
Sodium: 138 mEq/L (ref 135–145)
Total Protein: 7 g/dL (ref 6.0–8.3)

## 2012-07-23 LAB — CBC
MCHC: 34 g/dL (ref 30.0–36.0)
RDW: 13.4 % (ref 11.5–15.5)
WBC: 13.2 10*3/uL — ABNORMAL HIGH (ref 4.0–10.5)

## 2012-07-23 SURGERY — OPEN REDUCTION INTERNAL FIXATION (ORIF) TIBIAL PLATEAU
Anesthesia: General | Laterality: Left | Wound class: Clean

## 2012-07-23 MED ORDER — SODIUM CHLORIDE 0.9 % IR SOLN
Status: DC | PRN
  Administered 2012-07-23

## 2012-07-23 MED ORDER — MIDAZOLAM HCL 5 MG/5ML IJ SOLN
INTRAMUSCULAR | Status: DC | PRN
  Administered 2012-07-23: 2 mg via INTRAVENOUS

## 2012-07-23 MED ORDER — CEFAZOLIN SODIUM-DEXTROSE 2-3 GM-% IV SOLR
2.0000 g | INTRAVENOUS | Status: AC
  Administered 2012-07-23: 2 g via INTRAVENOUS
  Filled 2012-07-23: qty 50

## 2012-07-23 MED ORDER — MORPHINE SULFATE 2 MG/ML IJ SOLN
1.0000 mg | INTRAMUSCULAR | Status: DC | PRN
  Administered 2012-07-23 (×4): 2 mg via INTRAVENOUS
  Filled 2012-07-23 (×4): qty 1

## 2012-07-23 MED ORDER — FENTANYL CITRATE 0.05 MG/ML IJ SOLN
100.0000 ug | Freq: Once | INTRAMUSCULAR | Status: AC
  Administered 2012-07-23: 100 ug via INTRAVENOUS
  Filled 2012-07-23: qty 2

## 2012-07-23 MED ORDER — CEFAZOLIN SODIUM-DEXTROSE 2-3 GM-% IV SOLR
INTRAVENOUS | Status: AC
  Filled 2012-07-23: qty 50

## 2012-07-23 MED ORDER — SUCCINYLCHOLINE CHLORIDE 20 MG/ML IJ SOLN
INTRAMUSCULAR | Status: DC | PRN
  Administered 2012-07-23: 100 mg via INTRAVENOUS

## 2012-07-23 MED ORDER — EPHEDRINE SULFATE 50 MG/ML IJ SOLN
INTRAMUSCULAR | Status: DC | PRN
  Administered 2012-07-23: 7.5 mg via INTRAVENOUS

## 2012-07-23 MED ORDER — LACTATED RINGERS IV SOLN
INTRAVENOUS | Status: DC | PRN
  Administered 2012-07-23 – 2012-07-24 (×2): via INTRAVENOUS

## 2012-07-23 MED ORDER — FENTANYL CITRATE 0.05 MG/ML IJ SOLN
INTRAMUSCULAR | Status: DC | PRN
  Administered 2012-07-23: 100 ug via INTRAVENOUS
  Administered 2012-07-23 – 2012-07-24 (×2): 50 ug via INTRAVENOUS

## 2012-07-23 MED ORDER — PROPOFOL 10 MG/ML IV BOLUS
INTRAVENOUS | Status: DC | PRN
  Administered 2012-07-23: 160 mg via INTRAVENOUS

## 2012-07-23 SURGICAL SUPPLY — 63 items
BAG ZIPLOCK 12X15 (MISCELLANEOUS) ×2 IMPLANT
BANDAGE ELASTIC 4 VELCRO ST LF (GAUZE/BANDAGES/DRESSINGS) ×2 IMPLANT
BANDAGE ELASTIC 6 VELCRO ST LF (GAUZE/BANDAGES/DRESSINGS) ×2 IMPLANT
BANDAGE GAUZE ELAST BULKY 4 IN (GAUZE/BANDAGES/DRESSINGS) ×6 IMPLANT
BIT DRILL 100X2.5XANTM LCK (BIT) ×1 IMPLANT
BIT DRILL CAL (BIT) ×1 IMPLANT
BIT DRL 100X2.5XANTM LCK (BIT) ×1
BLADE SURG 15 STRL LF DISP TIS (BLADE) IMPLANT
BLADE SURG 15 STRL SS (BLADE)
CANISTER SUCTION 2500CC (MISCELLANEOUS) ×2 IMPLANT
CLOTH BEACON ORANGE TIMEOUT ST (SAFETY) ×2 IMPLANT
DRAPE C-ARM 42X72 X-RAY (DRAPES) ×2 IMPLANT
DRAPE C-ARMOR (DRAPES) ×2 IMPLANT
DRAPE ORTHO SPLIT 77X108 STRL (DRAPES) ×2
DRAPE STERI IOBAN 125X83 (DRAPES) ×2 IMPLANT
DRAPE SURG ORHT 6 SPLT 77X108 (DRAPES) ×2 IMPLANT
DRAPE TABLE BACK 44X90 PK DISP (DRAPES) IMPLANT
DRILL BIT 2.5MM (BIT) ×1
DRILL BIT CAL (BIT) ×2
DRSG ADAPTIC 3X8 NADH LF (GAUZE/BANDAGES/DRESSINGS) ×4 IMPLANT
DRSG PAD ABDOMINAL 8X10 ST (GAUZE/BANDAGES/DRESSINGS) ×4 IMPLANT
DURAPREP 26ML APPLICATOR (WOUND CARE) ×2 IMPLANT
ELECT REM PT RETURN 9FT ADLT (ELECTROSURGICAL) ×2
ELECTRODE REM PT RTRN 9FT ADLT (ELECTROSURGICAL) ×1 IMPLANT
GLOVE BIOGEL PI IND STRL 8 (GLOVE) ×3 IMPLANT
GLOVE BIOGEL PI INDICATOR 8 (GLOVE) ×3
GLOVE SURG ORTHO 9.0 STRL STRW (GLOVE) ×2 IMPLANT
GOWN STRL REIN 2XL XLG LVL4 (GOWN DISPOSABLE) ×6 IMPLANT
GOWN STRL REIN XL XLG (GOWN DISPOSABLE) ×2 IMPLANT
IMMOBILIZER KNEE 20 (SOFTGOODS) ×4
IMMOBILIZER KNEE 20 THIGH 36 (SOFTGOODS) ×2 IMPLANT
K-WIRE ACE 1.6X6 (WIRE) ×4
KIT BASIN OR (CUSTOM PROCEDURE TRAY) ×2 IMPLANT
KWIRE ACE 1.6X6 (WIRE) ×2 IMPLANT
MANIFOLD NEPTUNE II (INSTRUMENTS) IMPLANT
NS IRRIG 1000ML POUR BTL (IV SOLUTION) IMPLANT
PACK GENERAL/GYN (CUSTOM PROCEDURE TRAY) IMPLANT
PACK LOWER EXTREMITY WL (CUSTOM PROCEDURE TRAY) ×2 IMPLANT
PENCIL BUTTON HOLSTER BLD 10FT (ELECTRODE) ×2 IMPLANT
PLATE LOCK 5H STD LT PROX TIB (Plate) ×2 IMPLANT
POSITIONER SURGICAL ARM (MISCELLANEOUS) ×2 IMPLANT
SCREW CORTICAL 3.5MM 26MM (Screw) ×2 IMPLANT
SCREW CORTICAL 3.5MM 38MM (Screw) ×2 IMPLANT
SCREW LOCK 3.5X70 DIST TIB (Screw) ×2 IMPLANT
SCREW LOCK 3.5X80 DIST TIB (Screw) ×2 IMPLANT
SCREW LOCK CORT STAR 3.5X30 (Screw) ×2 IMPLANT
SCREW LOCK CORT STAR 3.5X40 (Screw) ×2 IMPLANT
SCREW LOCK CORT STAR 3.5X75 (Screw) ×2 IMPLANT
SCREW LOCK CORT STAR 3.5X85 (Screw) ×2 IMPLANT
SCREW LP 3.5X90MM (Screw) ×2 IMPLANT
SCREW LP 3.5X95MM (Screw) ×2 IMPLANT
SET PAD KNEE POSITIONER (MISCELLANEOUS) ×2 IMPLANT
SPONGE GAUZE 4X4 12PLY (GAUZE/BANDAGES/DRESSINGS) ×2 IMPLANT
STAPLER VISISTAT 35W (STAPLE) ×2 IMPLANT
SUT VIC AB 0 CT1 27 (SUTURE)
SUT VIC AB 0 CT1 27XBRD ANTBC (SUTURE) IMPLANT
SUT VIC AB 0 CT1 36 (SUTURE) ×2 IMPLANT
SUT VIC AB 2-0 CT1 27 (SUTURE) ×2
SUT VIC AB 2-0 CT1 TAPERPNT 27 (SUTURE) ×2 IMPLANT
TOWEL NATURAL 10PK STERILE (DISPOSABLE) ×2 IMPLANT
TOWEL OR NON WOVEN STRL DISP B (DISPOSABLE) ×2 IMPLANT
TRAY FOLEY CATH 14FRSI W/METER (CATHETERS) IMPLANT
WATER STERILE IRR 1500ML POUR (IV SOLUTION) IMPLANT

## 2012-07-23 NOTE — ED Provider Notes (Signed)
I saw and evaluated the patient, reviewed the resident's note and I agree with the findings and plan.   .Face to face Exam:  General:  Awake HEENT:  Atraumatic Resp:  Normal effort Abd:  Nondistended Neuro:No focal weakness    Nelia Shi, MD 07/23/12 2103

## 2012-07-23 NOTE — H&P (Signed)
Billy Horn is an 67 y.o. male.   Chief Complaint: painful left knee HPI: [name] WHO SUSTAINED INJURY TO HIS LEFT KNEE WHILE TRYING TO SLIDE HEAVY FURNITURE DOWN SOME STAIRS WHICH GOT OUT OF CONTROL.  Past Medical History  Diagnosis Date  . Diabetes mellitus     controlled with diet and exercise  . Cancer     prostate cancer    Past Surgical History  Procedure Laterality Date  . Tonsillectomy      as a child  . Robot assisted laparoscopic radical prostatectomy  04/06/2011    Procedure: ROBOTIC ASSISTED LAPAROSCOPIC RADICAL PROSTATECTOMY LEVEL 2;  Surgeon: Billy Mc, MD;  Location: [facility];  Service: Urology;  Laterality: N/A;  Bilateral Pelvic Lymphadenectomy     No family history on file. Social History:  reports that he has never smoked. He has never used smokeless tobacco. He reports that  drinks alcohol. He reports that he does not use illicit drugs.  Allergies: No Known Allergies   (Not in a hospital admission)  No results found for this or any previous visit (from the past 48 hour(s)). Dg Tibia/fibula Left  07/23/2012  *RADIOLOGY REPORT*  Clinical Data: Fall, deformity and fracture of the tibial plateau.  LEFT TIBIA AND FIBULA - 2 VIEW  Comparison: Knee films obtained earlier today.  Findings: Other than the complex and comminuted tibial plateau fracture, minimally displaced fracture of the fibular head identified.  No other injuries are identified involving the tibia or fibula distally.  Soft tissues are unremarkable  IMPRESSION: Complex and comminuted tibial plateau fracture as described on the knee films.  There is a minimally displaced fibular head fracture. No distal fractures are identified.   Original Report Authenticated By: Billy Horn, M.D.    Dg Knee Complete 4 Views Left  07/23/2012  *RADIOLOGY REPORT*  Clinical Data: Leg pain.  LEFT KNEE - COMPLETE 4+ VIEW  Comparison: None  Findings: Lipoma hemarthrosis noted.  There is a comminuted and  depressed fracture involving the lateral tibial plateau.  Fracture fragments are depressed by approximately 1.2 cm.  No dislocation identified.  IMPRESSION:  1.  Comminuted lateral tibial plateau fracture with depression of fracture fragments by 1.2 cm. Due to overlap of bony structures the overall extent of fracture cannot be assessed.  Advise further evaluation with CT of the left knee. 2.  Lipoma hemarthrosis.   Original Report Authenticated By: Billy Horn, M.D.     Review of Systems  Constitutional: Negative.   HENT: Negative.   Eyes: Negative.   Respiratory: Negative.   Cardiovascular: Negative.   Gastrointestinal: Negative.   Genitourinary: Negative.   Musculoskeletal: Positive for joint pain and falls.  Skin: Negative.   Neurological: Positive for sensory change.  Endo/Heme/Allergies: Negative.   Psychiatric/Behavioral: Negative.     There were no vitals taken for this visit. Physical Exam left knee held in semi-flexed position,pulses intact ,left heel hypoesthesia, foot movement is good, left knee diffusely swollen. Left elbow with abrasions ,left ear with abrasions. X-r reveals depressed lateral tibial plateau fracture.  Assessment/Plan Left lateral tibial plateau fracture with depression/plan orif left tibial plateau fracture.  Billy Horn 07/23/2012, 5:59 PM

## 2012-07-23 NOTE — Anesthesia Preprocedure Evaluation (Addendum)
Anesthesia Evaluation  Patient identified by MRN, date of birth, ID band Patient awake    Reviewed: Allergy & Precautions, H&P , NPO status , Patient's Chart, lab work & pertinent test results, reviewed documented beta blocker date and time   History of Anesthesia Complications Negative for: history of anesthetic complications  Airway Mallampati: III TM Distance: <3 FB Neck ROM: full    Dental  (+) Teeth Intact   Pulmonary neg pulmonary ROS,    Pulmonary exam normal       Cardiovascular Exercise Tolerance: Good negative cardio ROS  Rhythm:regular Rate:Normal     Neuro/Psych negative neurological ROS  negative psych ROS   GI/Hepatic negative GI ROS, Neg liver ROS,   Endo/Other  diabetes (diet and exercise), Well Controlled  Renal/GU negative Renal ROS     Musculoskeletal   Abdominal Normal abdominal exam  (+)   Peds  Hematology H/o prostatectomy for prostate CA 12/12   Anesthesia Other Findings Last ate at 1:15 pm, injury at 2:15 pm  Reproductive/Obstetrics negative OB ROS                          Anesthesia Physical Anesthesia Plan  ASA: II  Anesthesia Plan: General ETT and Rapid Sequence   Post-op Pain Management:    Induction:   Airway Management Planned:   Additional Equipment:   Intra-op Plan:   Post-operative Plan:   Informed Consent: I have reviewed the patients History and Physical, chart, labs and discussed the procedure including the risks, benefits and alternatives for the proposed anesthesia with the patient or authorized representative who has indicated his/her understanding and acceptance.   Dental Advisory Given  Plan Discussed with: CRNA and Surgeon  Anesthesia Plan Comments:        Anesthesia Quick Evaluation

## 2012-07-23 NOTE — ED Notes (Signed)
Pt reports trying to move a large piece of furniture down the stairs causing him to fall injuring his left knee, leg, left arm and left side of face.

## 2012-07-23 NOTE — ED Provider Notes (Signed)
History     CSN: [id number]  Arrival date & time 07/23/12  1518   None     Chief Complaint  Patient presents with  . Fall  . Leg Pain  . Facial Swelling    (Consider location/radiation/quality/duration/timing/severity/associated sxs/prior treatment) HPI Patient reports left knee pain following a fall.  He fell down some stairs while helping a friend move a piece of furniture down the stairs.  He denies any dizziness or LOC during or preceding the fall.  States that he hit the left side of his head on the drywall and slid down the stairs and ended with his left leg under the credenza.  His friend helped get his leg out and he reported immediate pain and swelling of the left knee.  Worse with any sort of movement.  Past Medical History  Diagnosis Date  . Diabetes mellitus     controlled with diet and exercise  . Cancer     prostate cancer    Past Surgical History  Procedure Laterality Date  . Tonsillectomy      as a child  . Robot assisted laparoscopic radical prostatectomy  04/06/2011    Procedure: ROBOTIC ASSISTED LAPAROSCOPIC RADICAL PROSTATECTOMY LEVEL 2;  Surgeon: Crecencio Mc, MD;  Location: [facility];  Service: Urology;  Laterality: N/A;  Bilateral Pelvic Lymphadenectomy     No family history on file.  History  Substance Use Topics  . Smoking status: Never Smoker   . Smokeless tobacco: Never Used  . Alcohol Use: Yes     Comment: occas wine      Review of Systems  Constitutional: Negative for fever and chills.  HENT:       Left sided face discomfort  Eyes: Negative.   Respiratory: Negative.   Cardiovascular: Negative.   Gastrointestinal: Negative.   Genitourinary: Negative.   Musculoskeletal: Positive for joint swelling.  Skin: Positive for wound (abrasions to left face, left arm).  Neurological: Negative for dizziness, syncope, weakness and light-headedness.    Allergies  Review of patient's allergies indicates no known allergies.  Home Medications    Current Outpatient Rx  Name  Route  Sig  Dispense  Refill  . aspirin EC 81 MG tablet   Oral   Take 81 mg by mouth every morning.         . Multiple Vitamin (MULTIVITAMIN WITH MINERALS) TABS   Oral   Take 1 tablet by mouth daily.         . psyllium (METAMUCIL) 0.52 G capsule   Oral   Take 1.04 g by mouth daily.            BP 171/89  Pulse 78  Temp(Src) 97.4 F (36.3 C) (Oral)  Resp 18  SpO2 99%  Physical Exam  Constitutional: He is oriented to person, place, and time. He appears well-developed and well-nourished. No distress.  HENT:  Head: Normocephalic.  Small abrasion just anterior to left ear  Eyes: Pupils are equal, round, and reactive to light.  Neck: Normal range of motion. Neck supple.  Cardiovascular: Normal rate.   Pulmonary/Chest: Effort normal.  Abdominal: Soft. There is no tenderness. There is no rebound and no guarding.  Musculoskeletal:       Left knee: He exhibits decreased range of motion and swelling. He exhibits no ecchymosis, no laceration, no erythema and normal alignment. Tenderness found.  Left knee with significant swelling, diffuse tenderness, limited assessment of joint laxity secondary to pain.  Bounding DP pulse.  Movement intact  in ankle and toes.  Sensation grossly intact in LLE to light touch.  Neurological: He is alert and oriented to person, place, and time. He exhibits normal muscle tone.  Skin: Skin is warm and dry. No rash noted. He is not diaphoretic. No erythema.       ED Course  Procedures (including critical care time)  Labs Reviewed - No data to display Dg Tibia/fibula Left  07/23/2012  *RADIOLOGY REPORT*  Clinical Data: Fall, deformity and fracture of the tibial plateau.  LEFT TIBIA AND FIBULA - 2 VIEW  Comparison: Knee films obtained earlier today.  Findings: Other than the complex and comminuted tibial plateau fracture, minimally displaced fracture of the fibular head identified.  No other injuries are identified  involving the tibia or fibula distally.  Soft tissues are unremarkable  IMPRESSION: Complex and comminuted tibial plateau fracture as described on the knee films.  There is a minimally displaced fibular head fracture. No distal fractures are identified.   Original Report Authenticated By: Irish Lack, M.D.    Dg Knee Complete 4 Views Left  07/23/2012  *RADIOLOGY REPORT*  Clinical Data: Leg pain.  LEFT KNEE - COMPLETE 4+ VIEW  Comparison: None  Findings: Lipoma hemarthrosis noted.  There is a comminuted and depressed fracture involving the lateral tibial plateau.  Fracture fragments are depressed by approximately 1.2 cm.  No dislocation identified.  IMPRESSION:  1.  Comminuted lateral tibial plateau fracture with depression of fracture fragments by 1.2 cm. Due to overlap of bony structures the overall extent of fracture cannot be assessed.  Advise further evaluation with CT of the left knee. 2.  Lipoma hemarthrosis.   Original Report Authenticated By: Signa Kell, M.D.      No diagnosis found.    MDM  67 yo otherwise healthy male presenting with left knee pain and swelling following an injury.  X-rays show a comminuted, displaced lateral tibial plateau fracture and minimally displaced fibular head fracture.  No evidence for compartment syndrome.  Morphine q1hr prn for pain control.  Will contact orthopedic surgery.  Dr. Montez Morita to come by and evaluate patient.  Will admit under Dr. Montez Morita for surgical repair.        Phebe Colla, MD 07/23/12 2029

## 2012-07-23 NOTE — ED Notes (Signed)
[id number]<AV> Expected date:<BR> Expected time:<BR> Means of arrival:<BR> Comments:<BR> 67 y.o. Fall; Leg swelling

## 2012-07-24 ENCOUNTER — Encounter (HOSPITAL_COMMUNITY): Payer: Self-pay | Admitting: Orthopedic Surgery

## 2012-07-24 ENCOUNTER — Inpatient Hospital Stay (HOSPITAL_COMMUNITY): Payer: Managed Care, Other (non HMO)

## 2012-07-24 ENCOUNTER — Other Ambulatory Visit: Payer: Self-pay | Admitting: Orthopedic Surgery

## 2012-07-24 ENCOUNTER — Emergency Department (HOSPITAL_COMMUNITY): Payer: Managed Care, Other (non HMO)

## 2012-07-24 DIAGNOSIS — S82109A Unspecified fracture of upper end of unspecified tibia, initial encounter for closed fracture: Secondary | ICD-10-CM | POA: Diagnosis not present

## 2012-07-24 LAB — PROTIME-INR
INR: 1.19 (ref 0.00–1.49)
Prothrombin Time: 14.9 seconds (ref 11.6–15.2)

## 2012-07-24 LAB — GLUCOSE, CAPILLARY

## 2012-07-24 MED ORDER — HYDROMORPHONE 0.3 MG/ML IV SOLN
INTRAVENOUS | Status: DC
  Administered 2012-07-24: 0.999 mg via INTRAVENOUS
  Administered 2012-07-24: 1.19 mg via INTRAVENOUS
  Administered 2012-07-24: 0.799 mg via INTRAVENOUS
  Administered 2012-07-24: 03:00:00 via INTRAVENOUS

## 2012-07-24 MED ORDER — ONDANSETRON HCL 4 MG/2ML IJ SOLN: 4.0000 mg | Freq: Four times a day (QID) | INTRAMUSCULAR | Status: DC | PRN

## 2012-07-24 MED ORDER — ACETAMINOPHEN 500 MG PO TABS
1000.0000 mg | ORAL_TABLET | Freq: Once | ORAL | Status: AC
  Administered 2012-07-24: 1000 mg via ORAL
  Filled 2012-07-24: qty 2

## 2012-07-24 MED ORDER — OXYCODONE-ACETAMINOPHEN 5-325 MG PO TABS
1.0000 | ORAL_TABLET | ORAL | Status: DC | PRN
  Administered 2012-07-24 – 2012-07-25 (×3): 2 via ORAL
  Filled 2012-07-24 (×4): qty 2

## 2012-07-24 MED ORDER — METOCLOPRAMIDE HCL 5 MG/ML IJ SOLN: 5.0000 mg | Freq: Three times a day (TID) | INTRAMUSCULAR | Status: DC | PRN

## 2012-07-24 MED ORDER — KETOROLAC TROMETHAMINE 30 MG/ML IJ SOLN: 15.0000 mg | Freq: Once | INTRAMUSCULAR | Status: DC | PRN

## 2012-07-24 MED ORDER — METOCLOPRAMIDE HCL 10 MG PO TABS: 5.0000 mg | ORAL_TABLET | Freq: Three times a day (TID) | ORAL | Status: DC | PRN

## 2012-07-24 MED ORDER — METHOCARBAMOL 100 MG/ML IJ SOLN
500.0000 mg | Freq: Four times a day (QID) | INTRAVENOUS | Status: DC | PRN
  Filled 2012-07-24: qty 5

## 2012-07-24 MED ORDER — ONDANSETRON HCL 4 MG PO TABS: 4.0000 mg | ORAL_TABLET | Freq: Four times a day (QID) | ORAL | Status: DC | PRN

## 2012-07-24 MED ORDER — MENTHOL 3 MG MT LOZG
1.0000 | LOZENGE | OROMUCOSAL | Status: DC | PRN
  Filled 2012-07-24: qty 9

## 2012-07-24 MED ORDER — WARFARIN SODIUM 5 MG PO TABS
5.0000 mg | ORAL_TABLET | Freq: Once | ORAL | Status: AC
  Administered 2012-07-24: 5 mg via ORAL
  Filled 2012-07-24: qty 1

## 2012-07-24 MED ORDER — LACTATED RINGERS IV SOLN: INTRAVENOUS | Status: DC

## 2012-07-24 MED ORDER — WARFARIN VIDEO: Freq: Once | Status: DC

## 2012-07-24 MED ORDER — DOCUSATE SODIUM 100 MG PO CAPS
100.0000 mg | ORAL_CAPSULE | Freq: Two times a day (BID) | ORAL | Status: DC
  Administered 2012-07-24 – 2012-07-27 (×7): 100 mg via ORAL

## 2012-07-24 MED ORDER — DIPHENHYDRAMINE HCL 50 MG/ML IJ SOLN: 12.5000 mg | Freq: Four times a day (QID) | INTRAMUSCULAR | Status: DC | PRN

## 2012-07-24 MED ORDER — ACETAMINOPHEN 650 MG RE SUPP: 650.0000 mg | Freq: Four times a day (QID) | RECTAL | Status: DC | PRN

## 2012-07-24 MED ORDER — WARFARIN - PHARMACIST DOSING INPATIENT: Freq: Every day | Status: DC

## 2012-07-24 MED ORDER — FENTANYL CITRATE 0.05 MG/ML IJ SOLN: 25.0000 ug | INTRAMUSCULAR | Status: DC | PRN

## 2012-07-24 MED ORDER — ACETAMINOPHEN 10 MG/ML IV SOLN: 1000.0000 mg | Freq: Once | INTRAVENOUS | Status: DC | PRN

## 2012-07-24 MED ORDER — ZOLPIDEM TARTRATE 5 MG PO TABS: 5.0000 mg | ORAL_TABLET | Freq: Every evening | ORAL | Status: DC | PRN

## 2012-07-24 MED ORDER — METHOCARBAMOL 500 MG PO TABS
500.0000 mg | ORAL_TABLET | Freq: Four times a day (QID) | ORAL | Status: DC | PRN
  Administered 2012-07-24 – 2012-07-25 (×4): 500 mg via ORAL
  Filled 2012-07-24 (×4): qty 1

## 2012-07-24 MED ORDER — ACETAMINOPHEN 325 MG PO TABS: 650.0000 mg | ORAL_TABLET | Freq: Four times a day (QID) | ORAL | Status: DC | PRN

## 2012-07-24 MED ORDER — DIPHENHYDRAMINE HCL 12.5 MG/5ML PO ELIX: 12.5000 mg | ORAL_SOLUTION | Freq: Four times a day (QID) | ORAL | Status: DC | PRN

## 2012-07-24 MED ORDER — SODIUM CHLORIDE 0.9 % IV SOLN
INTRAVENOUS | Status: DC
  Administered 2012-07-24: 1000 mL via INTRAVENOUS

## 2012-07-24 MED ORDER — SODIUM CHLORIDE 0.9 % IJ SOLN: 9.0000 mL | INTRAMUSCULAR | Status: DC | PRN

## 2012-07-24 MED ORDER — PHENOL 1.4 % MT LIQD
1.0000 | OROMUCOSAL | Status: DC | PRN
  Filled 2012-07-24: qty 177

## 2012-07-24 MED ORDER — HYDROMORPHONE 0.3 MG/ML IV SOLN
INTRAVENOUS | Status: AC
  Filled 2012-07-24: qty 25

## 2012-07-24 MED ORDER — ACETAMINOPHEN 10 MG/ML IV SOLN
1000.0000 mg | Freq: Four times a day (QID) | INTRAVENOUS | Status: DC
  Administered 2012-07-24 (×3): 1000 mg via INTRAVENOUS
  Filled 2012-07-24 (×3): qty 100

## 2012-07-24 MED ORDER — HYDROMORPHONE HCL PF 1 MG/ML IJ SOLN
INTRAMUSCULAR | Status: DC | PRN
  Administered 2012-07-24 (×2): 1 mg via INTRAVENOUS

## 2012-07-24 MED ORDER — NALOXONE HCL 0.4 MG/ML IJ SOLN: 0.4000 mg | INTRAMUSCULAR | Status: DC | PRN

## 2012-07-24 MED ORDER — CEFAZOLIN SODIUM 1-5 GM-% IV SOLN
1.0000 g | Freq: Four times a day (QID) | INTRAVENOUS | Status: AC
  Administered 2012-07-24 (×2): 1 g via INTRAVENOUS
  Filled 2012-07-24 (×2): qty 50

## 2012-07-24 MED ORDER — COUMADIN BOOK
1.0000 | Freq: Once | Status: AC
  Administered 2012-07-24: 1
  Filled 2012-07-24: qty 1

## 2012-07-24 NOTE — Anesthesia Postprocedure Evaluation (Signed)
  Anesthesia Post-op Note  Patient: Billy Horn  Procedure(s) Performed: Procedure(s): OPEN REDUCTION INTERNAL FIXATION (ORIF) TIBIAL PLATEAU (Left)  Patient Location: PACU  Anesthesia Type:General  Level of Consciousness: awake, alert , oriented and patient cooperative  Airway and Oxygen Therapy: Patient Spontanous Breathing and Patient connected to face mask oxygen  Post-op Pain: none  Post-op Assessment: Post-op Vital signs reviewed and Patient's Cardiovascular Status Stable  Post-op Vital Signs: Reviewed and stable  Complications: No apparent anesthesia complications

## 2012-07-24 NOTE — Transfer of Care (Signed)
Immediate Anesthesia Transfer of Care Note  Patient: Billy Horn  Procedure(s) Performed: Procedure(s): OPEN REDUCTION INTERNAL FIXATION (ORIF) TIBIAL PLATEAU (Left)  Patient Location: PACU  Anesthesia Type:General  Level of Consciousness: awake, sedated and patient cooperative  Airway & Oxygen Therapy: Patient Spontanous Breathing and Patient connected to face mask oxygen  Post-op Assessment: Report given to PACU RN and Post -op Vital signs reviewed and stable  Post vital signs: Reviewed and stable  Complications: No apparent anesthesia complications

## 2012-07-24 NOTE — Evaluation (Signed)
Physical Therapy Evaluation Patient Details Name: Billy Horn MRN: [id number] DOB: 03-14-1946 Today's Date: 07/24/2012 Time: 4098-1191 PT Time Calculation (min): 47 min  PT Assessment / Plan / Recommendation Clinical Impression  67 y.o. male with L tibial plateau fx s/p ORIF 07/23/12. Pt was very active PTA, lives alone but has help prn. 1 flight of stairs to enter apt. TOday he walked 36' with RW and min A. Excellent progress expected. He would benefit from acute PT to maximize safety and independence with mobillity.     PT Assessment  Patient needs continued PT services    Follow Up Recommendations  Home health PT    Does the patient have the potential to tolerate intense rehabilitation      Barriers to Discharge None      Equipment Recommendations  Rolling walker with 5" wheels    Recommendations for Other Services OT consult   Frequency Min 6X/week    Precautions / Restrictions Restrictions Weight Bearing Restrictions: Yes LLE Weight Bearing: Non weight bearing   Pertinent Vitals/Pain *7/10 L knee at rest and with walking, PCA used, ice applied**      Mobility  Bed Mobility Bed Mobility: Supine to Sit Supine to Sit: 4: Min assist Details for Bed Mobility Assistance: for LLE Transfers Transfers: Sit to Stand;Stand to Sit Sit to Stand: 4: Min assist;From bed Stand to Sit: 4: Min assist;To chair/3-in-1;With armrests Details for Transfer Assistance: Pt 90% Ambulation/Gait Ambulation/Gait Assistance: 4: Min guard Assistive device: Rolling walker Ambulation/Gait Assistance Details: 55 Gait Pattern: Step-to pattern General Gait Details: NWB LLE, VCs for positioning in RW    Exercises Total Joint Exercises Ankle Circles/Pumps: AROM;Both;15 reps;Supine Quad Sets: AROM;Both;5 reps;Supine Hip ABduction/ADduction: AAROM;Left;Supine;10 reps Straight Leg Raises: AAROM;Left;10 reps;Supine   PT Diagnosis: Difficulty walking;Acute pain  PT Problem List: Decreased range  of motion;Decreased strength;Decreased activity tolerance;Pain;Decreased mobility PT Treatment Interventions: Gait training;Stair training;Functional mobility training;DME instruction;Therapeutic activities;Therapeutic exercise;Patient/family education   PT Goals Acute Rehab PT Goals PT Goal Formulation: With patient Time For Goal Achievement: 07/31/12 Potential to Achieve Goals: Good Pt will go Supine/Side to Sit: Independently PT Goal: Supine/Side to Sit - Progress: Goal set today Pt will go Sit to Stand: with modified independence PT Goal: Sit to Stand - Progress: Goal set today Pt will Ambulate: >150 feet;with modified independence;with rolling walker PT Goal: Ambulate - Progress: Goal set today Pt will Go Up / Down Stairs: Flight;with min assist;with least restrictive assistive device;with rail(s) PT Goal: Up/Down Stairs - Progress: Goal set today Pt will Perform Home Exercise Program: Independently PT Goal: Perform Home Exercise Program - Progress: Goal set today  Visit Information  Last PT Received On: 07/24/12 Assistance Needed: +1    Subjective Data  Subjective: I'll do whatever it takes to get better.  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, Chief of Staff Communication: No difficulties    Cognition  Cognition Overall Cognitive Status: Appears within functional limits for tasks assessed/performed Arousal/Alertness:  Awake/alert Orientation Level: Appears intact for tasks assessed Behavior During Session: [facility] for tasks performed  Extremity/Trunk Assessment Right Upper Extremity Assessment RUE ROM/Strength/Tone: Within functional levels Left Upper Extremity Assessment LUE ROM/Strength/Tone: Within functional levels Right Lower Extremity Assessment RLE ROM/Strength/Tone: Within functional levels RLE Sensation: WFL - Light Touch RLE Coordination: WFL - gross/fine motor Left Lower Extremity Assessment LLE ROM/Strength/Tone: Deficits LLE ROM/Strength/Tone Deficits: knee in KI, ankle AROm decreased approx 50%, hip strength 2/5 LLE Sensation: WFL - Light Touch LLE Coordination: WFL - gross/fine motor Trunk Assessment Trunk Assessment: Normal   Balance    End of Session PT - End of Session Equipment Utilized During Treatment: Gait belt Activity Tolerance: Patient tolerated treatment well Patient left: in chair;with call bell/phone within reach Nurse Communication: Mobility status  GP     Ralene Bathe Kistler 07/24/2012, 9:07 AM

## 2012-07-24 NOTE — Progress Notes (Signed)
ANTICOAGULATION CONSULT NOTE - Initial Consult  Pharmacy Consult for warfarin Indication: VTE prophylaxis  No Known Allergies  Patient Measurements: Height: 6' (182.9 cm) Weight: 185 lb (83.915 kg) IBW/kg (Calculated) : 77.6 Heparin Dosing Weight:   Vital Signs: Temp: 97.9 F (36.6 C) (04/06 0505) Temp src: Oral (04/06 0505) BP: 177/88 mmHg (04/06 0505) Pulse Rate: 78 (04/06 0505)  Labs:  Recent Labs  07/23/12 1817 07/23/12 2244 07/24/12 0520  HGB 11.6*  --   --   HCT 34.1*  --   --   PLT 229  --   --   LABPROT  --  14.0 14.9  INR  --  1.09 1.19  CREATININE 1.04  --   --     Estimated Creatinine Clearance: 75.7 ml/min (by C-G formula based on Cr of 1.04).   Medical History: Past Medical History  Diagnosis Date  . Diabetes mellitus     controlled with diet and exercise  . Cancer     prostate cancer    Medications:  Prescriptions prior to admission  Medication Sig Dispense Refill  . aspirin EC 81 MG tablet Take 81 mg by mouth every morning.      . Multiple Vitamin (MULTIVITAMIN WITH MINERALS) TABS Take 1 tablet by mouth daily.      . psyllium (METAMUCIL) 0.52 G capsule Take 1.04 g by mouth daily.        Scheduled:  . acetaminophen  1,000 mg Intravenous Q6H  .  ceFAZolin (ANCEF) IV  1 g Intravenous Q6H  . [COMPLETED]  ceFAZolin (ANCEF) IV  2 g Intravenous On Call to OR  . coumadin book  1 each Does not apply Once  . docusate sodium  100 mg Oral BID  . [COMPLETED] fentaNYL  100 mcg Intravenous Once  . HYDROmorphone PCA 0.3 mg/mL   Intravenous Q4H  . warfarin  5 mg Oral ONCE-1800  . [START ON 07/25/2012] warfarin   Does not apply Once  . Warfarin - Pharmacist Dosing Inpatient   Does not apply q1800  . [DISCONTINUED] HYDROmorphone PCA 0.3 mg/mL        Assessment: Patient with warfarin per pharmacy order for VTE prophylaxis. Goal of Therapy:  INR 2-3    Plan:  Start with Coumadin 5 mg tonight. Check PT/INR daily. Provide Coumadin education.    Darlina Guys, Jacquenette Shone Crowford 07/24/2012,5:58 AM

## 2012-07-24 NOTE — Anesthesia Postprocedure Evaluation (Signed)
  Anesthesia Post-op Note  Anesthesia Post Note  Patient: Billy Horn  Procedure(s) Performed: Procedure(s) (LRB): OPEN REDUCTION INTERNAL FIXATION (ORIF) TIBIAL PLATEAU (Left)  Anesthesia type: General  Patient location: PACU  Post pain: Pain level controlled  Post assessment: Post-op Vital signs reviewed  Last Vitals:  BP 175/85 HR 95 SPO2 100% on facemask 02 RR 12  Post vital signs: Reviewed  Level of consciousness: sedated  Complications: No apparent anesthesia complications.

## 2012-07-24 NOTE — Op Note (Signed)
Billy Horn, DRAPEAU NO.:  [id number]  MEDICAL RECORD NO.:  [id number]  LOCATION:  1612                         FACILITY:  [facility]  PHYSICIAN:  Myrtie Neither, MD      DATE OF BIRTH:  1945-07-11  DATE OF PROCEDURE:  07/23/2012 DATE OF DISCHARGE:                              OPERATIVE REPORT   PREOPERATIVE DIAGNOSIS:  Comminuted depressed lateral tibial plateau fracture of the left knee.  POSTOPERATIVE DIAGNOSIS:  Comminuted depressed lateral tibial plateau fracture of the left knee.  ANESTHESIA:  General.  PROCEDURE:  Open reduction and internal fixation with the buttress plate, left tibial plateau.  DESCRIPTION OF PROCEDURE:  The patient was taken to the operating room after given adequate preoperative medications, given general anesthesia and intubated.  Left lower extremity was prepped with DuraPrep and draped in sterile manner.  Tourniquet and Bovie used for hemostasis.  C- arm was used to visualize fracture reduction.  A hockey stick lateral incision was made below the joint line of the lateral tibial plateau area going through the skin and subcutaneous tissue down to the fascia. Soft tissue was subperiosteally elevated from the fracture site of the lateral tibial plateau.  The posterior fragment as well as posterolateral tibial plateau was markedly depressed.  Fracture fragment attachment anteriorly was opened as a window and with use of punch and periosteal elevator, the articular surface was brought back up to normal alignment.  Posterior fragment step-off was also reduced by posterior compression.  Two K-wires were placed across and visualized with use of C-arm in AP, lateral and oblique views.  There was found to be satisfactory reduced.  Next, a lateral tibial plateau buttress plate was then put in place and size 0 locking screws were placed proximally and three screws were placed along the shaft, this was sounded to have very good reduction,  decompression of the fracture site.  Again, this was visualized with use of C-arm.  Copious irrigation was then done.  Wound closure was then done with 0 Vicryl for the fascia, 2-0 for the subcutaneous, and skin staples for the skin.  Compressive bulky dressing was applied followed by knee immobilizer.  The patient tolerated the procedure quite well, went to the recovery room in stable and satisfactory condition.     Myrtie Neither, MD     AC/MEDQ  D:  07/24/2012  T:  07/24/2012  Job:  [id number]

## 2012-07-24 NOTE — Progress Notes (Signed)
Subjective: 1 Day Post-Op Procedure(s) (LRB): OPEN REDUCTION INTERNAL FIXATION (ORIF) TIBIAL PLATEAU (Left) Patient reports pain as 6 on 0-10 scale.    Objective: Vital signs in last 24 hours: Temp:  [97.4 F (36.3 C)-98.7 F (37.1 C)] 98.7 F (37.1 C) (04/06 0600) Pulse Rate:  [76-96] 76 (04/06 0600) Resp:  [12-20] 16 (04/06 1200) BP: (148-191)/(75-101) 148/75 mmHg (04/06 0600) SpO2:  [96 %-99 %] 96 % (04/06 1200) Weight:  [83.915 kg (185 lb)] 83.915 kg (185 lb) (04/06 0405)  Intake/Output from previous day: 04/05 0701 - 04/06 0700 In: 1060 [P.O.:60; I.V.:1000] Out: 300 [Urine:300] Intake/Output this shift: Total I/O In: 480 [P.O.:480] Out: 950 [Urine:950]   Recent Labs  07/23/12 1817  HGB 11.6*    Recent Labs  07/23/12 1817  WBC 13.2*  RBC 3.93*  HCT 34.1*  PLT 229    Recent Labs  07/23/12 1817  NA 138  K 4.0  CL 103  CO2 29  BUN 18  CREATININE 1.04  GLUCOSE 179*  CALCIUM 8.9    Recent Labs  07/23/12 2244 07/24/12 0520  INR 1.09 1.19    Neurologically intact Dorsiflexion/Plantar flexion intact  Assessment/Plan: 1 Day Post-Op Procedure(s) (LRB): OPEN REDUCTION INTERNAL FIXATION (ORIF) TIBIAL PLATEAU (Left) Advance diet Up with therapy  Bud Kaeser F 07/24/2012, 1:20 PM

## 2012-07-25 ENCOUNTER — Inpatient Hospital Stay (HOSPITAL_COMMUNITY): Payer: Managed Care, Other (non HMO)

## 2012-07-25 LAB — GLUCOSE, CAPILLARY
Glucose-Capillary: 167 mg/dL — ABNORMAL HIGH (ref 70–99)
Glucose-Capillary: 191 mg/dL — ABNORMAL HIGH (ref 70–99)

## 2012-07-25 LAB — PROTIME-INR
INR: 1.28 (ref 0.00–1.49)
Prothrombin Time: 15.7 seconds — ABNORMAL HIGH (ref 11.6–15.2)

## 2012-07-25 MED ORDER — OXYCODONE HCL 5 MG PO TABS
5.0000 mg | ORAL_TABLET | ORAL | Status: DC | PRN
  Administered 2012-07-25 – 2012-07-27 (×7): 10 mg via ORAL
  Filled 2012-07-25 (×7): qty 2

## 2012-07-25 MED ORDER — WARFARIN SODIUM 7.5 MG PO TABS
7.5000 mg | ORAL_TABLET | Freq: Once | ORAL | Status: AC
  Administered 2012-07-25: 7.5 mg via ORAL
  Filled 2012-07-25 (×2): qty 1

## 2012-07-25 NOTE — Progress Notes (Signed)
Subjective: 2 Days Post-Op Procedure(s) (LRB): OPEN REDUCTION INTERNAL FIXATION (ORIF) TIBIAL PLATEAU (Left) Patient reports pain as 4 on 0-10 scale.    Objective: Vital signs in last 24 hours: Temp:  [97.9 F (36.6 C)-101.2 F (38.4 C)] 97.9 F (36.6 C) (04/07 0408) Pulse Rate:  [76-103] 76 (04/07 0408) Resp:  [16-22] 20 (04/07 0408) BP: (137-203)/(73-91) 137/77 mmHg (04/07 0408) SpO2:  [88 %-99 %] 96 % (04/07 0408) FiO2 (%):  [39 %] 39 % (04/06 1600)  Intake/Output from previous day: 04/06 0701 - 04/07 0700 In: 1740 [P.O.:1740] Out: 2700 [Urine:2700] Intake/Output this shift: Total I/O In: 360 [P.O.:360] Out: 50 [Urine:50]   Recent Labs  07/23/12 1817  HGB 11.6*    Recent Labs  07/23/12 1817  WBC 13.2*  RBC 3.93*  HCT 34.1*  PLT 229    Recent Labs  07/23/12 1817  NA 138  K 4.0  CL 103  CO2 29  BUN 18  CREATININE 1.04  GLUCOSE 179*  CALCIUM 8.9    Recent Labs  07/24/12 0520 07/25/12 0429  INR 1.19 1.28    Neurologically intact Neurovascular intact Dorsiflexion/Plantar flexion intact  Assessment/Plan: 2 Days Post-Op Procedure(s) (LRB): OPEN REDUCTION INTERNAL FIXATION (ORIF) TIBIAL PLATEAU (Left) Up with therapy D/C IV fluids Discharge to SNF  Doris Gruhn F 07/25/2012, 2:10 PM

## 2012-07-25 NOTE — Progress Notes (Signed)
Clinical Social Work Department CLINICAL SOCIAL WORK PLACEMENT NOTE 07/25/2012  Patient:  RONAL, Billy Horn  Account Number:  [id number] Admit date:  07/23/2012  Clinical Social Worker:  Cori Razor, LCSW  Date/time:  07/25/2012 03:00 PM  Clinical Social Work is seeking post-discharge placement for this patient at the following level of care:   SKILLED NURSING   (*CSW will update this form in Epic as items are completed)   07/25/2012  Patient/family provided with [facility] Department of Clinical Social Work's list of facilities offering this level of care within the geographic area requested by the patient (or if unable, by the patient's family).  07/25/2012  Patient/family informed of their freedom to choose among providers that offer the needed level of care, that participate in Medicare, Medicaid or managed care program needed by the patient, have an available bed and are willing to accept the patient.  07/25/2012  Patient/family informed of [facility]' ownership interest in [facility], as well as of the fact that they are under no obligation to receive care at this facility.  PASARR submitted to [employer] on 07/25/2012 PASARR number received from [employer] on 07/25/2012  FL2 transmitted to all facilities in geographic area requested by pt/family on  07/25/2012 FL2 transmitted to all facilities within larger geographic area on   Patient informed that his/her managed care company has contracts with or will negotiate with  certain facilities, including the following:     Patient/family informed of bed offers received:   Patient chooses bed at  Physician recommends and patient chooses bed at    Patient to be transferred to  on   Patient to be transferred to facility by   The following physician request were entered in Epic:   Additional Comments:  Cori Razor LCSW [phone]

## 2012-07-25 NOTE — Progress Notes (Signed)
ANTICOAGULATION CONSULT NOTE - Follow Up Consult  Pharmacy Consult for Coumadin Indication: VTE prophylaxis  No Known Allergies  Patient Measurements: Height: 6' (182.9 cm) Weight: 185 lb (83.915 kg) IBW/kg (Calculated) : 77.6  Vital Signs: Temp: 97.9 F (36.6 C) (04/07 0408) Temp src: Oral (04/07 0408) BP: 137/77 mmHg (04/07 0408) Pulse Rate: 76 (04/07 0408)  Labs:  Recent Labs  07/23/12 1817 07/23/12 2244 07/24/12 0520 07/25/12 0429  HGB 11.6*  --   --   --   HCT 34.1*  --   --   --   PLT 229  --   --   --   LABPROT  --  14.0 14.9 15.7*  INR  --  1.09 1.19 1.28  CREATININE 1.04  --   --   --     Estimated Creatinine Clearance: 75.7 ml/min (by C-G formula based on Cr of 1.04).    Assessment: 39 yoM with left lateral tibial fx s/p ORIF 4/6. Coumadin ordered post-op for VTE prophylaxis. Coumadin score = 4  INR 1.28 today, no CBC today. No bleeding documented.  Goal of Therapy:  INR 2-3 Monitor platelets by anticoagulation protocol: Yes   Plan:   Coumadin 7.5mg  x 1 tonight  Daily PT/INR  Pharmacy will f/u with Coumadin education today  Geoffry Paradise, PharmD, BCPS Pager: [phone] 8:49 AM Pharmacy #: [phone]

## 2012-07-25 NOTE — Progress Notes (Signed)
Clinical Social Work Department BRIEF PSYCHOSOCIAL ASSESSMENT 07/25/2012  Patient:  Billy Horn, Billy Horn     Account Number:  [id number]     Admit date:  07/23/2012  Clinical Social Worker:  Candie Chroman  Date/Time:  07/25/2012 02:53 PM  Referred by:  CSW  Date Referred:  07/25/2012 Referred for  SNF Placement   Other Referral:   Interview type:  Patient Other interview type:    PSYCHOSOCIAL DATA Living Status:  ALONE Admitted from facility:   Level of care:   Primary support name:  Lawrence Santiago Primary support relationship to patient:  FRIEND Degree of support available:   supportive    CURRENT CONCERNS Current Concerns  Other - See comment   Other Concerns:    SOCIAL WORK ASSESSMENT / PLAN Pt is a 67 yr old gentleman living at home prior to hospitalization. CSW met with pt to assist with d/c planning . Pt was hoping to d/c home following hospital stay but will need ST Rehab. PT is recommending CIR vs SNF. Pt is in agreement with d/c plan. SNF search has been initiated and bed offers will be provided as received.   Assessment/plan status:  Psychosocial Support/Ongoing Assessment of Needs Other assessment/ plan:   CIR vs SNF   Information/referral to community resources:   SNF list provided.    PATIENT'S/FAMILY'S RESPONSE TO PLAN OF CARE: Pt would like ST Rehab at either CIR or SNF following hospital d/c.   Cori Razor LCSW [phone]

## 2012-07-25 NOTE — Progress Notes (Signed)
Spoke to Dr. Montez Morita on phone informed him I have been working with patient on IS but temp. 101.5 and cannot have tylenol at this time due to will be over limit is at 3.95 grams right now for last 24 hours, MD states no more tylenol until limit has ran out, continue to encourage IS and CXR ordered, MD suspects atelectasis.

## 2012-07-25 NOTE — Evaluation (Signed)
Occupational Therapy Evaluation Patient Details Name: Billy Horn MRN: [id number] DOB: Dec 21, 1945 Today's Date: 07/25/2012 Time: 4098-1191 OT Time Calculation (min): 47 min  OT Assessment / Plan / Recommendation Clinical Impression  Pt is a 67 year old male s/p L tibial plateau fracture with ORIF and currently displays decreased activity tolerance, decreased strength and overall a change in ADL independence. he will benefit from skilled OT services to improve ADL independence for next venue of care.     OT Assessment  Patient needs continued OT Services    Follow Up Recommendations  Would benefit from CIR versus SNF;Supervision/Assistance - 24 hour; Pt states the two caregivers he has may not be able to physically assist as initially thought (have their own medical issues).   Barriers to Discharge      Equipment Recommendations  3 in 1 bedside comode;Tub/shower bench    Recommendations for Other Services    Frequency  Min 2X/week    Precautions / Restrictions Precautions Precautions: Fall Required Braces or Orthoses: Knee Immobilizer - Left Restrictions Weight Bearing Restrictions: Yes LLE Weight Bearing: Non weight bearing        ADL  Eating/Feeding: Simulated;Independent Where Assessed - Eating/Feeding: Chair Grooming: Simulated;Set up Where Assessed - Grooming: Supported sitting Upper Body Bathing: Simulated;Chest;Right arm;Left arm;Abdomen;Set up Where Assessed - Upper Body Bathing: Unsupported sitting Lower Body Bathing: Simulated;Moderate assistance Where Assessed - Lower Body Bathing: Supported sit to stand Upper Body Dressing: Simulated;Set up Where Assessed - Upper Body Dressing: Unsupported sitting Lower Body Dressing: Simulated;Moderate assistance Where Assessed - Lower Body Dressing: Supported sit to Pharmacist, hospital: Performed;Minimal assistance Toilet Transfer Method: Other (comment) (into bathroom with RW to 3in1) Toilet Transfer Equipment: Raised  toilet seat with arms (or 3-in-1 over toilet) Toileting - Clothing Manipulation and Hygiene: Simulated;Minimal assistance Where Assessed - Engineer, mining and Hygiene: Standing Equipment Used: Rolling walker ADL Comments: Pt states initially he has enough assist at home between 2 lady friends that can come and stay with pt. But later in session he states one lady just had carpal tunnel surgery last week and the other is scheduled for medical tests this week. Explained SNF rehab option to be more independent before returning home and pt is considering this option especially since he currently feels very "wiped out" after toilet transfer. Informed progression nurse of this information. Pt required constant min assist to complete toilet transfer with NWB L LE for safety/balance support.    OT Diagnosis: Generalized weakness;Acute pain  OT Problem List: Decreased strength;Decreased range of motion;Decreased activity tolerance;Decreased knowledge of use of DME or AE OT Treatment Interventions: Self-care/ADL training;Therapeutic activities;DME and/or AE instruction;Patient/family education   OT Goals Acute Rehab OT Goals OT Goal Formulation: With patient Time For Goal Achievement: 08/01/12 Potential to Achieve Goals: Good ADL Goals Pt Will Perform Grooming: with min assist;Standing at sink (min guard) ADL Goal: Grooming - Progress: Goal set today Pt Will Perform Lower Body Bathing: with min assist;Sit to stand from chair;Sit to stand from bed;with adaptive equipment ADL Goal: Lower Body Bathing - Progress: Goal set today Pt Will Perform Lower Body Dressing: with min assist;Sit to stand from chair;Sit to stand from bed;with adaptive equipment ADL Goal: Lower Body Dressing - Progress: Goal set today Pt Will Transfer to Toilet: with supervision;Ambulation;3-in-1 ADL Goal: Toilet Transfer - Progress: Goal set today Pt Will Perform Toileting - Clothing Manipulation: with  supervision;Standing ADL Goal: Toileting - Clothing Manipulation - Progress: Goal set today  Visit Information  Last OT  Received On: 07/25/12 Assistance Needed: +1    Subjective Data  Subjective: I just feel wiped out Patient Stated Goal: wants to do for himself again   Prior Functioning               Vision/Perception Vision - History Baseline Vision: Wears glasses only for reading   Cognition  Cognition Overall Cognitive Status: Appears within functional limits for tasks assessed/performed Arousal/Alertness: Awake/alert Behavior During Session: [facility] for tasks performed    Extremity/Trunk Assessment Right Upper Extremity Assessment RUE ROM/Strength/Tone: Within functional levels Left Upper Extremity Assessment LUE ROM/Strength/Tone: Within functional levels     Mobility Bed Mobility Bed Mobility: Supine to Sit Supine to Sit: 4: Min assist;HOB elevated Transfers Transfers: Sit to Stand;Stand to Sit Sit to Stand: 4: Min assist;With upper extremity assist;From bed;From chair/3-in-1 Stand to Sit: 4: Min assist;With upper extremity assist;To chair/3-in-1 Details for Transfer Assistance: min verbal cues for hand placement/safety     Exercise     Balance Balance Balance Assessed: Yes Dynamic Standing Balance Dynamic Standing - Level of Assistance: 4: Min assist   End of Session OT - End of Session Equipment Utilized During Treatment: Gait belt Activity Tolerance: Patient limited by fatigue Patient left: in chair;with call bell/phone within reach  GO     Lennox Laity [phone] 07/25/2012, 11:17 AM

## 2012-07-25 NOTE — Progress Notes (Signed)
Rehab Admissions Coordinator Note:  Patient was screened by Trish Mage for appropriateness for an Inpatient Acute Rehab Consult. Noted OT now recommending CIR.  At this time, we are recommending Inpatient Rehab consult.  Trish Mage 07/25/2012, 1:29 PM  I can be reached at [phone].

## 2012-07-25 NOTE — Progress Notes (Signed)
Spoke to Dr. Montez Morita on floor, informed him PT worked with patient but states he was unable to do as much today due to being tired, patient sleepy throughout day, and patient states he is just very tired. No labs today, but per night nurse patient had rigors during the night after PCA was discontinued.

## 2012-07-25 NOTE — Progress Notes (Signed)
Physical Therapy Treatment Patient Details Name: ARROW TOMKO MRN: [id number] DOB: 01-Nov-1945 Today's Date: 07/25/2012 Time: 6295-2841 PT Time Calculation (min): 10 min  PT Assessment / Plan / Recommendation Comments on Treatment Session  Notable decline in activity tolerance today. Pt stated he's "wiped out" today, fatigued from short walk to bathroom with OT. Performed ther ex with LLE today, pt stated he couldn't tolerate walking this afternoon.     Follow Up Recommendations  CIR (CIR vs SNF)     Does the patient have the potential to tolerate intense rehabilitation     Barriers to Discharge        Equipment Recommendations  Rolling walker with 5" wheels    Recommendations for Other Services OT consult  Frequency Min 6X/week   Plan Discharge plan needs to be updated    Precautions / Restrictions Precautions Precautions: Fall Required Braces or Orthoses: Knee Immobilizer - Left Restrictions Weight Bearing Restrictions: Yes LLE Weight Bearing: Non weight bearing   Pertinent Vitals/Pain *5/10 LLE, ice applied**    Mobility  Bed Mobility Bed Mobility: Not assessed Supine to Sit: 4: Min assist;HOB elevated Details for Bed Mobility Assistance: deferred -pt stated he's "totally wiped out" after walking to bathroom with OT earlier today Transfers Transfers: Not assessed Sit to Stand: 4: Min assist;With upper extremity assist;From bed;From chair/3-in-1 Stand to Sit: 4: Min assist;With upper extremity assist;To chair/3-in-1 Details for Transfer Assistance: min verbal cues for hand placement/safety Ambulation/Gait Ambulation/Gait Assistance: Not tested (comment)    Exercises Total Joint Exercises Ankle Circles/Pumps: AROM;Both;15 reps;Supine Quad Sets: AROM;Both;Supine;10 reps Towel Squeeze: AROM;Both;10 reps;Supine Hip ABduction/ADduction: AAROM;Left;Supine;10 reps Straight Leg Raises: AAROM;Left;10 reps;Supine   PT Diagnosis:    PT Problem List:   PT Treatment  Interventions:     PT Goals Acute Rehab PT Goals PT Goal Formulation: With patient Time For Goal Achievement: 07/31/12 Potential to Achieve Goals: Good Pt will go Supine/Side to Sit: Independently Pt will go Sit to Stand: with modified independence Pt will Ambulate: >150 feet;with modified independence;with rolling walker Pt will Go Up / Down Stairs: Flight;with min assist;with least restrictive assistive device;with rail(s) Pt will Perform Home Exercise Program: Independently PT Goal: Perform Home Exercise Program - Progress: Progressing toward goal  Visit Information  Last PT Received On: 07/25/12 Assistance Needed: +1    Subjective Data  Subjective: I feel so much weaker today. I'm wiped out. I've decided to go to rehab (ST-SNF).  Patient Stated Goal: return to active lifestyle -lots of walking   Cognition  Cognition Overall Cognitive Status: Appears within functional limits for tasks assessed/performed Arousal/Alertness: Awake/alert Behavior During Session: [facility] for tasks performed    Balance  Balance Balance Assessed: Yes Dynamic Standing Balance Dynamic Standing - Level of Assistance: 4: Min assist  End of Session PT - End of Session Activity Tolerance: Patient limited by fatigue Patient left: in chair;with call bell/phone within reach Nurse Communication: Mobility status   GP     Ralene Bathe Kistler 07/25/2012, 1:57 PM [phone]

## 2012-07-25 NOTE — Progress Notes (Signed)
Nutrition Brief Note  Patient identified on the Malnutrition Screening Tool (MST) Report  Body mass index is 25.08 kg/(m^2). Patient meets criteria for Overweight based on current BMI.   Current diet order is Carb modified, patient is consuming approximately 100% of meals at this time. Pt reports that he usually weighs 185 lbs (no wt loss) and was eating well PTA. Encouraged adequate protein and po intake during recovery. Pt follows a diabetic diet at home. Pt has no questions or concerns at this time. Labs and medications reviewed.   No nutrition interventions warranted at this time. If nutrition issues arise, please consult RD.   Ian Malkin RD, LDN Inpatient Clinical Dietitian Pager: [phone] After Hours Pager: [phone]

## 2012-07-25 NOTE — Progress Notes (Signed)
CSW consulted for SNF placement. PN reviewed. PT is recommending HHPT following hospital d/c. RNCM wil assist with d/c planning. CSW is available to assist if plan changes and placement is needed.  Cori Razor LCSW [phone]

## 2012-07-26 ENCOUNTER — Encounter (HOSPITAL_COMMUNITY): Payer: Self-pay | Admitting: Orthopedic Surgery

## 2012-07-26 DIAGNOSIS — S82109A Unspecified fracture of upper end of unspecified tibia, initial encounter for closed fracture: Secondary | ICD-10-CM

## 2012-07-26 DIAGNOSIS — W19XXXA Unspecified fall, initial encounter: Secondary | ICD-10-CM

## 2012-07-26 LAB — GLUCOSE, CAPILLARY
Glucose-Capillary: 164 mg/dL — ABNORMAL HIGH (ref 70–99)
Glucose-Capillary: 212 mg/dL — ABNORMAL HIGH (ref 70–99)
Glucose-Capillary: 155 mg/dL — ABNORMAL HIGH (ref 70–99)

## 2012-07-26 LAB — PROTIME-INR: INR: 1.79 — ABNORMAL HIGH (ref 0.00–1.49)

## 2012-07-26 MED ORDER — OXYCODONE HCL 5 MG PO TABS: 5.0000 mg | ORAL_TABLET | ORAL | Status: DC | PRN

## 2012-07-26 MED ORDER — WARFARIN SODIUM 2 MG PO TABS: 2.0000 mg | ORAL_TABLET | Freq: Once | ORAL | Status: DC

## 2012-07-26 MED ORDER — METHOCARBAMOL 500 MG PO TABS: 500.0000 mg | ORAL_TABLET | Freq: Four times a day (QID) | ORAL | Status: DC | PRN

## 2012-07-26 MED ORDER — WARFARIN SODIUM 2 MG PO TABS
2.0000 mg | ORAL_TABLET | Freq: Once | ORAL | Status: AC
  Administered 2012-07-26: 2 mg via ORAL
  Filled 2012-07-26: qty 1

## 2012-07-26 MED ORDER — ZOLPIDEM TARTRATE 5 MG PO TABS: 5.0000 mg | ORAL_TABLET | Freq: Every evening | ORAL | Status: DC | PRN

## 2012-07-26 NOTE — Consult Note (Signed)
Physical Medicine and Rehabilitation Consult  Reason for Consult: Left tibial plateau fracture Referring Physician:  Dr. Montez Morita   HPI: Billy Horn is a 67 y.o. male with history of DM, prostate cancer who fell while trying to slide furniture down some stairs and sustained an injury to his left knee on 07/23/12. Xrays revealed comminuted depressed left tibial plateau fracture. He was evaluated by Dr. Montez Morita and underwent ORIF left tibial plateau on 04/06. Post op NWB LLE and on coumadin for DVT prophylaxis. Post op fever treated with IS. Therapies ongoing and patient making good progress but limited by fatigue. Therapy team, MD, patient requesting CIR for progression.    Review of Systems  HENT: Positive for hearing loss.   Eyes: Negative for blurred vision and double vision.  Respiratory: Negative for cough and shortness of breath.   Cardiovascular: Negative for chest pain and palpitations.  Gastrointestinal: Positive for constipation. Negative for heartburn and nausea.  Genitourinary: Negative for urgency and frequency.  Musculoskeletal: Positive for joint pain. Negative for back pain.  Neurological: Positive for weakness and headaches.   Past Medical History  Diagnosis Date  . Diabetes mellitus     controlled with diet and exercise  . Cancer     prostate cancer   Past Surgical History  Procedure Laterality Date  . Tonsillectomy      as a child  . Robot assisted laparoscopic radical prostatectomy  04/06/2011    Procedure: ROBOTIC ASSISTED LAPAROSCOPIC RADICAL PROSTATECTOMY LEVEL 2;  Surgeon: Crecencio Mc, MD;  Location: [facility];  Service: Urology;  Laterality: N/A;  Bilateral Pelvic Lymphadenectomy   . Orif tibia plateau Left 07/23/2012    Procedure: OPEN REDUCTION INTERNAL FIXATION (ORIF) TIBIAL PLATEAU;  Surgeon: [name], MD;  Location: [facility];  Service: Orthopedics;  Laterality: Left;   Family History  Problem Relation Age of Onset  . Cancer Mother     Social  History: Lives alone. Independent and active PTA.  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 He reports that he has never smoked. He has never used smokeless tobacco. He reports that  drinks alcohol. He reports that he does not use illicit drugs.  Allergies: No Known Allergies  Medications Prior to Admission  Medication Sig Dispense Refill  . Multiple Vitamin (MULTIVITAMIN WITH MINERALS) TABS Take 1 tablet by mouth daily.      . [DISCONTINUED] aspirin EC 81 MG tablet Take 81 mg by mouth every morning.      . psyllium (METAMUCIL) 0.52 G capsule Take 1.04 g by mouth daily.         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, Hydrologist Functional Status:  Mobility: Bed Mobility Bed Mobility: Supine to Sit Supine to Sit: HOB elevated;5: Supervision;With rails Transfers Transfers: Sit to Stand;Stand to Sit Sit to Stand: 4: Min guard;From chair/3-in-1;With upper extremity assist Stand to Sit: 4: Min guard;With upper extremity assist;To chair/3-in-1 Ambulation/Gait Ambulation/Gait Assistance: 4: Min guard Ambulation Distance (Feet): 70 Feet Assistive device: Rolling walker Ambulation/Gait Assistance Details: 55 Gait Pattern: Step-to pattern General Gait Details: NWB LLE, VCs for positioning in RW, Distance limted by fatigue  ADL: ADL Eating/Feeding: Simulated;Independent Where Assessed - Eating/Feeding: Chair Grooming: Simulated;Set up Where Assessed - Grooming: Supported sitting Upper Body Bathing: Simulated;Chest;Right  arm;Left arm;Abdomen;Set up Where Assessed - Upper Body Bathing: Unsupported sitting Lower Body Bathing: Simulated;Moderate assistance Where Assessed - Lower Body Bathing: Supported sit to stand Upper Body Dressing: Simulated;Set up Where Assessed - Upper Body Dressing: Unsupported sitting Lower Body Dressing: Simulated;Moderate assistance Where Assessed - Lower Body Dressing: Supported sit to Pharmacist, hospital: Performed;Minimal assistance Toilet Transfer Method: Other (comment) (into bathroom with RW to 3in1) Toilet Transfer Equipment: Raised toilet seat with arms (or 3-in-1 over toilet) Equipment Used: Long-handled sponge;Long-handled shoe horn;Sock aid;Rolling walker;Knee Immobilizer;Reacher ADL Comments: Pt was min guard for straight path functional mobility to to the bathroom but still needed slight min assist for balance with turns and backing up to surfaces. Much improved activity tolerance today, however, and was able to tolerate to the 3in1 over the toilet and back to chair with less fatigue reported. Introduced AE for LB dressing and demosntrated all pieces. Explained AE coverage also.   Cognition: Cognition Arousal/Alertness: Awake/alert Orientation Level: Oriented X4 Cognition Overall Cognitive Status: Appears within functional limits for tasks assessed/performed Arousal/Alertness: Awake/alert Orientation Level: Appears intact for tasks assessed Behavior During Session: [facility] for tasks performed  Blood pressure 147/76, pulse 89, temperature 99.1 F (37.3 C), temperature source Oral, resp. rate 16, height 6' (1.829 m), weight 83.915 kg (185 lb), SpO2 96.00%. Physical Exam  Nursing note and vitals reviewed. Constitutional: He is oriented to person, place, and time. He appears well-developed and well-nourished.  HENT:  Head: Normocephalic and atraumatic.  Eyes: Pupils are equal, round, and reactive to light.  Neck: Normal range of motion.  Cardiovascular: Normal rate and  regular rhythm.   Pulmonary/Chest: Effort normal and breath sounds normal. He has no wheezes.  Abdominal: Soft. Bowel sounds are normal.  Musculoskeletal:  LUE with multiple abrasions. Brace on LLE. Left knee appropriately tender.   Neurological: He is alert and oriented to person, place, and time.  UE strength 5/5. LLE limited by pain, KI. No sensory deficits.   Skin: Skin is warm.  Psychiatric: He has a normal mood and affect. His speech is normal and behavior is normal. Judgment and thought content normal.    Results for orders placed during the hospital encounter of 07/23/12 (from the past 24 hour(s))  GLUCOSE, CAPILLARY     Status: Abnormal   Collection Time    07/25/12  4:51 PM      Result Value Range   Glucose-Capillary 177 (*) 70 - 99 mg/dL  GLUCOSE, CAPILLARY     Status: Abnormal   Collection Time    07/25/12  8:19 PM      Result Value Range   Glucose-Capillary 191 (*) 70 - 99 mg/dL  GLUCOSE, CAPILLARY     Status: Abnormal   Collection Time    07/26/12  4:00 AM      Result Value Range   Glucose-Capillary 164 (*) 70 - 99 mg/dL  PROTIME-INR     Status: Abnormal   Collection Time    07/26/12  4:40 AM      Result Value Range   Prothrombin Time 20.2 (*) 11.6 - 15.2 seconds   INR 1.79 (*) 0.00 - 1.49  GLUCOSE, CAPILLARY     Status: Abnormal   Collection Time    07/26/12  7:36 AM      Result Value Range   Glucose-Capillary 212 (*) 70 - 99 mg/dL   Comment  1 Notify RN     Comment 2 Documented in Chart     Dg Chest 2 View  07/25/2012  *RADIOLOGY REPORT*  Clinical Data: Fever.  CHEST - 2 VIEW  Comparison: 03/31/2011  Findings: The heart size and pulmonary vascularity are normal and the lungs are clear.  No acute osseous abnormality.  No effusions.  IMPRESSION: No acute disease.   Original Report Authenticated By: Francene Boyers, M.D.     Assessment/Plan: Diagnosis: left tibial plateau fx after blunt trauma 1. Does the need for close, 24 hr/day medical supervision in concert  with the patient's rehab needs make it unreasonable for this patient to be served in a less intensive setting? Yes 2. Co-Morbidities requiring supervision/potential complications: post op fever, leukocytosis, pain mgt, mild ABLA, persistent fatigue 3. Due to bladder management, bowel management, safety, skin/wound care, disease management, medication administration, pain management and patient education, does the patient require 24 hr/day rehab nursing? Yes 4. Does the patient require coordinated care of a physician, rehab nurse, PT (1-2 hrs/day, 5 days/week) and OT (1-2 hrs/day, 5 days/week) to address physical and functional deficits in the context of the above medical diagnosis(es)? Yes Addressing deficits in the following areas: balance, endurance, locomotion, strength, transferring, bowel/bladder control, bathing, dressing, feeding, grooming, toileting and psychosocial support 5. Can the patient actively participate in an intensive therapy program of at least 3 hrs of therapy per day at least 5 days per week? Yes 6. The potential for patient to make measurable gains while on inpatient rehab is excellent 7. Anticipated functional outcomes upon discharge from inpatient rehab are mod I with PT, mod I with OT, n/a with SLP. 8. Estimated rehab length of stay to reach the above functional goals is: one week 9. Does the patient have adequate social supports to accommodate these discharge functional goals? Yes 10. Anticipated D/C setting: Home 11. Anticipated post D/C treatments: HH therapy 12. Overall Rehab/Functional Prognosis: excellent  RECOMMENDATIONS: This patient's condition is appropriate for continued rehabilitative care in the following setting: CIR Patient has agreed to participate in recommended program. Yes Note that insurance prior authorization may be required for reimbursement for recommended care.  Comment:Rehab RN to follow up.   Ranelle Oyster, MD,  [place] [facility]     07/26/2012

## 2012-07-26 NOTE — Progress Notes (Signed)
Occupational Therapy Treatment Patient Details Name: TRAVOR ROYCE MRN: [id number] DOB: 03-10-1946 Today's Date: 07/26/2012 Time: 4098-1191 OT Time Calculation (min): 18 min  OT Assessment / Plan / Recommendation Comments on Treatment Session Pt with much improved ability to tolerate functional activity today. Able to transfer into bathroom to 3in1 and back to chair with min assist.     Follow Up Recommendations  CIR;SNF;Supervision/Assistance - 24 hour    Barriers to Discharge       Equipment Recommendations  3 in 1 bedside comode    Recommendations for Other Services Rehab consult  Frequency Min 2X/week   Plan Discharge plan remains appropriate    Precautions / Restrictions Precautions Precautions: Fall Required Braces or Orthoses: Knee Immobilizer - Left Knee Immobilizer - Left: On at all times Restrictions Weight Bearing Restrictions: Yes LLE Weight Bearing: Non weight bearing        ADL  Toilet Transfer: Performed;Minimal assistance Toilet Transfer Equipment: Raised toilet seat with arms (or 3-in-1 over toilet) Equipment Used: Long-handled sponge;Long-handled shoe horn;Sock aid;Rolling walker;Knee Immobilizer;Reacher ADL Comments: Pt was min guard for straight path functional mobility to to the bathroom but still needed slight min assist for balance with turns and backing up to surfaces. Much improved activity tolerance today, however, and was able to tolerate to the 3in1 over the toilet and back to chair with less fatigue reported. Introduced AE for LB dressing and demosntrated all pieces. Explained AE coverage also.     OT Diagnosis:    OT Problem List:   OT Treatment Interventions:     OT Goals ADL Goals ADL Goal: Toilet Transfer - Progress: Progressing toward goals  Visit Information  Last OT Received On: 07/26/12 Assistance Needed: +1    Subjective Data  Subjective: I feel so much better today Patient Stated Goal: wants to return to independence   Prior  Functioning       Cognition  Cognition Overall Cognitive Status: Appears within functional limits for tasks assessed/performed Arousal/Alertness: Awake/alert Orientation Level: Appears intact for tasks assessed Behavior During Session: [facility] for tasks performed    Mobility  Bed Mobility Bed Mobility: Supine to Sit Supine to Sit: HOB elevated;5: Supervision;With rails Transfers Transfers: Sit to Stand;Stand to Sit Sit to Stand: 4: Min guard;From chair/3-in-1;With upper extremity assist Stand to Sit: 4: Min guard;With upper extremity assist;To chair/3-in-1 Details for Transfer Assistance: min verbal cues for safety    Exercises  Total Joint Exercises Ankle Circles/Pumps: AROM;Both;15 reps;Supine Quad Sets: AROM;Both;Supine;10 reps Towel Squeeze: AROM;Both;10 reps;Supine Hip ABduction/ADduction: AAROM;Left;Supine;20 reps Straight Leg Raises: AAROM;Left;Supine;20 reps   Balance     End of Session OT - End of Session Equipment Utilized During Treatment: Gait belt Activity Tolerance: Patient tolerated treatment well Patient left: in chair;with call bell/phone within reach  GO     Lennox Laity [phone] 07/26/2012, 11:17 AM

## 2012-07-26 NOTE — Progress Notes (Addendum)
ANTICOAGULATION CONSULT NOTE - Follow Up Consult  Pharmacy Consult for Coumadin Indication: VTE prophylaxis  No Known Allergies  Patient Measurements: Height: 6' (182.9 cm) Weight: 185 lb (83.915 kg) IBW/kg (Calculated) : 77.6  Vital Signs: Temp: 99.1 F (37.3 C) (04/08 0500) Temp src: Oral (04/07 2137) BP: 147/76 mmHg (04/08 0500) Pulse Rate: 89 (04/08 0500)  Labs:  Recent Labs  07/23/12 1817  07/24/12 0520 07/25/12 0429 07/26/12 0440  HGB 11.6*  --   --   --   --   HCT 34.1*  --   --   --   --   PLT 229  --   --   --   --   LABPROT  --   < > 14.9 15.7* 20.2*  INR  --   < > 1.19 1.28 1.79*  CREATININE 1.04  --   --   --   --   < > = values in this interval not displayed.  Estimated Creatinine Clearance: 75.7 ml/min (by C-G formula based on Cr of 1.04).    Assessment: 69 yoM with left lateral tibial fx s/p ORIF 4/6. Coumadin ordered post-op for VTE prophylaxis. Coumadin score = 4  INR jumped to 1.79 today, no CBC since 4/5. No bleeding documented.  Coumadin education completed 4/7.  Goal of Therapy:  INR 2-3 Monitor platelets by anticoagulation protocol: Yes   Plan:   Coumadin 2 mg x 1 tonight  Daily PT/INR  Pharmacy will f/u   Geoffry Paradise, PharmD, BCPS Pager: [phone] 7:17 AM Pharmacy #: [phone]

## 2012-07-26 NOTE — Discharge Summary (Signed)
Physician Discharge Summary  Patient ID: Billy Horn MRN: [id number] DOB/AGE: 1945-10-05 67 y.o.  Admit date: 07/23/2012 Discharge date: 07/26/2012  Admission Diagnoses:  Discharge Diagnoses:  Active Problems:   * No active hospital problems. *   Discharged Condition: good  Hospital Course: this a 67 y/o male treated for left tibial plateau fracture with orif with plate and screws. Patient has done well with non-wt- bearing to the left leg, return to the office in 10 days.  Consults: None  Significant Diagnostic Studies: radiology: X-Ray: wound of left knee healing well.no sign of infection.  Treatments: anticoagulation: warfarin  Discharge Exam: Blood pressure 147/76, pulse 89, temperature 99.1 F (37.3 C), temperature source Oral, resp. rate 16, height 6' (1.829 m), weight 83.915 kg (185 lb), SpO2 96.00%. Incision/Wound:  Disposition: 01-Home or Self Care  Discharge Orders   Future Orders Complete By Expires     Call MD / Call 911  As directed     Comments:      If you experience chest pain or shortness of breath, CALL 911 and be transported to the hospital emergency room.  If you develope a fever above 101 F, pus (white drainage) or increased drainage or redness at the wound, or calf pain, call your surgeon's office.    Constipation Prevention  As directed     Comments:      Drink plenty of fluids.  Prune juice may be helpful.  You may use a stool softener, such as Colace (over the counter) 100 mg twice a day.  Use MiraLax (over the counter) for constipation as needed.    Diet - low sodium heart healthy  As directed     Discharge instructions  As directed     Comments:      NON-WT-BEARING TO LEFT LEG, ICE PACKS TO LEFT KNEE 3 TIMES A DAY FOR 2 DAYS, RETURN TO OFFICE IN 10 DAYS, DAILY DRY DRESSING TO LEFT KNEE.    Driving restrictions  As directed     Comments:      No driving for 12 weeks    Increase activity slowly as tolerated  As directed     Lifting restrictions   As directed     Comments:      No lifting for 24 weeks        Medication List    STOP taking these medications       aspirin EC 81 MG tablet      TAKE these medications       METAMUCIL 0.52 G capsule  Generic drug:  psyllium  Take 1.04 g by mouth daily.     methocarbamol 500 MG tablet  Commonly known as:  ROBAXIN  Take 1 tablet (500 mg total) by mouth every 6 (six) hours as needed.     multivitamin with minerals Tabs  Take 1 tablet by mouth daily.     oxyCODONE 5 MG immediate release tablet  Commonly known as:  Oxy IR/ROXICODONE  Take 1-2 tablets (5-10 mg total) by mouth every 4 (four) hours as needed.     warfarin 2 MG tablet  Commonly known as:  COUMADIN  Take 1 tablet (2 mg total) by mouth one time only at 6 PM.     zolpidem 5 MG tablet  Commonly known as:  AMBIEN  Take 1 tablet (5 mg total) by mouth at bedtime as needed for sleep.         SignedKennieth Rad 07/26/2012, 7:57 AM

## 2012-07-26 NOTE — Progress Notes (Signed)
Physical Therapy Treatment Patient Details Name: Billy Horn MRN: [id number] DOB: Aug 16, 1945 Today's Date: 07/26/2012 Time: 0930-1000 PT Time Calculation (min): 30 min  PT Assessment / Plan / Recommendation Comments on Treatment Session  Improved mobility today, pt walked 7' with RW. Pt notes fatigue with mobility. Progressing well.     Follow Up Recommendations  CIR;SNF     Does the patient have the potential to tolerate intense rehabilitation     Barriers to Discharge        Equipment Recommendations  Rolling walker with 5" wheels;Wheelchair (measurements PT)    Recommendations for Other Services OT consult  Frequency Min 6X/week   Plan      Precautions / Restrictions Precautions Precautions: Fall Required Braces or Orthoses: Knee Immobilizer - Left Restrictions Weight Bearing Restrictions: Yes LLE Weight Bearing: Non weight bearing   Pertinent Vitals/Pain *4/10 L knee with walking Ice applied, premedicated**    Mobility  Bed Mobility Bed Mobility: Supine to Sit Supine to Sit: HOB elevated;5: Supervision;With rails Transfers Transfers: Sit to Stand;Stand to Sit Sit to Stand: With upper extremity assist;From bed;5: Supervision Stand to Sit: With upper extremity assist;To chair/3-in-1;5: Supervision Details for Transfer Assistance: min verbal cues for hand placement/safety Ambulation/Gait Ambulation/Gait Assistance: 4: Min guard Ambulation Distance (Feet): 70 Feet Assistive device: Rolling walker Gait Pattern: Step-to pattern General Gait Details: NWB LLE, VCs for positioning in RW, Distance limted by fatigue    Exercises Total Joint Exercises Ankle Circles/Pumps: AROM;Both;15 reps;Supine Quad Sets: AROM;Both;Supine;10 reps Towel Squeeze: AROM;Both;10 reps;Supine Hip ABduction/ADduction: AAROM;Left;Supine;20 reps Straight Leg Raises: AAROM;Left;Supine;20 reps   PT Diagnosis:    PT Problem List:   PT Treatment Interventions:     PT Goals Acute Rehab PT  Goals PT Goal Formulation: With patient Time For Goal Achievement: 07/31/12 Potential to Achieve Goals: Good Pt will go Supine/Side to Sit: Independently PT Goal: Supine/Side to Sit - Progress: Progressing toward goal Pt will go Sit to Stand: with modified independence PT Goal: Sit to Stand - Progress: Progressing toward goal Pt will Ambulate: >150 feet;with modified independence;with rolling walker PT Goal: Ambulate - Progress: Progressing toward goal Pt will Go Up / Down Stairs: Flight;with min assist;with least restrictive assistive device;with rail(s) Pt will Perform Home Exercise Program: Independently PT Goal: Perform Home Exercise Program - Progress: Progressing toward goal  Visit Information  Last PT Received On: 07/26/12 Assistance Needed: +1    Subjective Data  Subjective: I feel better today, but get exhausted after moving. My L leg feels like dead weight.  Patient Stated Goal: return to active lifestyle -lots of walking   Cognition  Cognition Overall Cognitive Status: Appears within functional limits for tasks assessed/performed Arousal/Alertness: Awake/alert Orientation Level: Appears intact for tasks assessed Behavior During Session: [facility] for tasks performed    Balance     End of Session PT - End of Session Equipment Utilized During Treatment: Gait belt Activity Tolerance: Patient tolerated treatment well Patient left: in chair;with call bell/phone within reach Nurse Communication: Mobility status   GP     Ralene Bathe Kistler 07/26/2012, 10:17 AM [phone]

## 2012-07-27 ENCOUNTER — Inpatient Hospital Stay (HOSPITAL_COMMUNITY)
Admission: RE | Admit: 2012-07-27 | Discharge: 2012-08-03 | DRG: 563 | Disposition: A | Discharge status: Home Health | Payer: Managed Care, Other (non HMO) | Source: Intra-hospital | Attending: Physical Medicine & Rehabilitation | Admitting: Physical Medicine & Rehabilitation

## 2012-07-27 DIAGNOSIS — D62 Acute posthemorrhagic anemia: Secondary | ICD-10-CM | POA: Diagnosis present

## 2012-07-27 DIAGNOSIS — S82109A Unspecified fracture of upper end of unspecified tibia, initial encounter for closed fracture: Secondary | ICD-10-CM

## 2012-07-27 DIAGNOSIS — S82142D Displaced bicondylar fracture of left tibia, subsequent encounter for closed fracture with routine healing: Secondary | ICD-10-CM

## 2012-07-27 DIAGNOSIS — S82143A Displaced bicondylar fracture of unspecified tibia, initial encounter for closed fracture: Secondary | ICD-10-CM

## 2012-07-27 DIAGNOSIS — W19XXXA Unspecified fall, initial encounter: Secondary | ICD-10-CM

## 2012-07-27 DIAGNOSIS — E119 Type 2 diabetes mellitus without complications: Secondary | ICD-10-CM

## 2012-07-27 DIAGNOSIS — S82409A Unspecified fracture of shaft of unspecified fibula, initial encounter for closed fracture: Principal | ICD-10-CM | POA: Diagnosis present

## 2012-07-27 DIAGNOSIS — T4275XA Adverse effect of unspecified antiepileptic and sedative-hypnotic drugs, initial encounter: Secondary | ICD-10-CM | POA: Diagnosis present

## 2012-07-27 DIAGNOSIS — K5909 Other constipation: Secondary | ICD-10-CM | POA: Diagnosis present

## 2012-07-27 DIAGNOSIS — I10 Essential (primary) hypertension: Secondary | ICD-10-CM | POA: Diagnosis present

## 2012-07-27 DIAGNOSIS — IMO0002 Reserved for concepts with insufficient information to code with codable children: Secondary | ICD-10-CM | POA: Insufficient documentation

## 2012-07-27 DIAGNOSIS — IMO0001 Reserved for inherently not codable concepts without codable children: Secondary | ICD-10-CM | POA: Diagnosis present

## 2012-07-27 DIAGNOSIS — R296 Repeated falls: Secondary | ICD-10-CM | POA: Diagnosis present

## 2012-07-27 DIAGNOSIS — K5900 Constipation, unspecified: Secondary | ICD-10-CM | POA: Diagnosis present

## 2012-07-27 DIAGNOSIS — D72829 Elevated white blood cell count, unspecified: Secondary | ICD-10-CM | POA: Diagnosis present

## 2012-07-27 DIAGNOSIS — S82209A Unspecified fracture of shaft of unspecified tibia, initial encounter for closed fracture: Principal | ICD-10-CM | POA: Diagnosis present

## 2012-07-27 DIAGNOSIS — E1165 Type 2 diabetes mellitus with hyperglycemia: Secondary | ICD-10-CM | POA: Insufficient documentation

## 2012-07-27 LAB — GLUCOSE, CAPILLARY: Glucose-Capillary: 180 mg/dL — ABNORMAL HIGH (ref 70–99)

## 2012-07-27 MED ORDER — WARFARIN SODIUM 2.5 MG PO TABS
2.5000 mg | ORAL_TABLET | Freq: Once | ORAL | Status: DC
  Filled 2012-07-27: qty 1

## 2012-07-27 MED ORDER — TRAZODONE HCL 50 MG PO TABS: 25.0000 mg | ORAL_TABLET | Freq: Every evening | ORAL | Status: DC | PRN

## 2012-07-27 MED ORDER — PHENOL 1.4 % MT LIQD
1.0000 | OROMUCOSAL | Status: DC | PRN
  Filled 2012-07-27: qty 177

## 2012-07-27 MED ORDER — PROCHLORPERAZINE MALEATE 5 MG PO TABS
5.0000 mg | ORAL_TABLET | Freq: Four times a day (QID) | ORAL | Status: DC | PRN
  Filled 2012-07-27: qty 2

## 2012-07-27 MED ORDER — FLEET ENEMA 7-19 GM/118ML RE ENEM: 1.0000 | ENEMA | Freq: Once | RECTAL | Status: AC | PRN

## 2012-07-27 MED ORDER — BISACODYL 10 MG RE SUPP: 10.0000 mg | Freq: Every day | RECTAL | Status: DC | PRN

## 2012-07-27 MED ORDER — ALUM & MAG HYDROXIDE-SIMETH 200-200-20 MG/5ML PO SUSP: 30.0000 mL | ORAL | Status: DC | PRN

## 2012-07-27 MED ORDER — WARFARIN SODIUM 2.5 MG PO TABS
2.5000 mg | ORAL_TABLET | Freq: Once | ORAL | Status: AC
  Administered 2012-07-27: 2.5 mg via ORAL
  Filled 2012-07-27: qty 1

## 2012-07-27 MED ORDER — WARFARIN - PHARMACIST DOSING INPATIENT
Freq: Every day | Status: DC
  Administered 2012-07-30: 18:00:00

## 2012-07-27 MED ORDER — PROCHLORPERAZINE EDISYLATE 5 MG/ML IJ SOLN
5.0000 mg | Freq: Four times a day (QID) | INTRAMUSCULAR | Status: DC | PRN
  Filled 2012-07-27: qty 2

## 2012-07-27 MED ORDER — MENTHOL 3 MG MT LOZG
1.0000 | LOZENGE | OROMUCOSAL | Status: DC | PRN
  Filled 2012-07-27: qty 9

## 2012-07-27 MED ORDER — DIPHENHYDRAMINE HCL 12.5 MG/5ML PO ELIX: 12.5000 mg | ORAL_SOLUTION | Freq: Four times a day (QID) | ORAL | Status: DC | PRN

## 2012-07-27 MED ORDER — METHOCARBAMOL 500 MG PO TABS
500.0000 mg | ORAL_TABLET | Freq: Four times a day (QID) | ORAL | Status: DC | PRN
  Administered 2012-07-30: 500 mg via ORAL
  Filled 2012-07-27: qty 1

## 2012-07-27 MED ORDER — BISACODYL 10 MG RE SUPP
10.0000 mg | Freq: Once | RECTAL | Status: AC
  Administered 2012-07-27: 10 mg via RECTAL
  Filled 2012-07-27: qty 1

## 2012-07-27 MED ORDER — ACETAMINOPHEN 325 MG PO TABS
325.0000 mg | ORAL_TABLET | ORAL | Status: DC | PRN
  Administered 2012-07-30 – 2012-08-02 (×2): 650 mg via ORAL
  Filled 2012-07-27 (×3): qty 2

## 2012-07-27 MED ORDER — POLYETHYLENE GLYCOL 3350 17 G PO PACK
17.0000 g | PACK | Freq: Every day | ORAL | Status: DC
  Administered 2012-07-27 – 2012-08-02 (×7): 17 g via ORAL
  Filled 2012-07-27 (×9): qty 1

## 2012-07-27 MED ORDER — INSULIN ASPART 100 UNIT/ML ~~LOC~~ SOLN
0.0000 [IU] | Freq: Every day | SUBCUTANEOUS | Status: DC
  Administered 2012-07-28: 2 [IU] via SUBCUTANEOUS
  Administered 2012-07-28: 5 [IU] via SUBCUTANEOUS
  Administered 2012-07-29 – 2012-08-02 (×3): 2 [IU] via SUBCUTANEOUS

## 2012-07-27 MED ORDER — TRAMADOL HCL 50 MG PO TABS
50.0000 mg | ORAL_TABLET | Freq: Four times a day (QID) | ORAL | Status: DC | PRN
  Administered 2012-07-28 – 2012-07-29 (×2): 50 mg via ORAL
  Filled 2012-07-27 (×2): qty 1

## 2012-07-27 MED ORDER — GUAIFENESIN-DM 100-10 MG/5ML PO SYRP: 5.0000 mL | ORAL_SOLUTION | Freq: Four times a day (QID) | ORAL | Status: DC | PRN

## 2012-07-27 MED ORDER — OXYCODONE HCL 5 MG PO TABS
5.0000 mg | ORAL_TABLET | ORAL | Status: DC | PRN
  Administered 2012-07-27 – 2012-08-01 (×16): 10 mg via ORAL
  Administered 2012-08-01: 5 mg via ORAL
  Administered 2012-08-02 – 2012-08-03 (×4): 10 mg via ORAL
  Filled 2012-07-27 (×24): qty 2

## 2012-07-27 MED ORDER — INSULIN ASPART 100 UNIT/ML ~~LOC~~ SOLN
0.0000 [IU] | Freq: Three times a day (TID) | SUBCUTANEOUS | Status: DC
  Administered 2012-07-27: 2 [IU] via SUBCUTANEOUS
  Administered 2012-07-28: 1 [IU] via SUBCUTANEOUS
  Administered 2012-07-28: 5 [IU] via SUBCUTANEOUS
  Administered 2012-07-28 – 2012-07-29 (×2): 3 [IU] via SUBCUTANEOUS
  Administered 2012-07-29 – 2012-07-30 (×3): 2 [IU] via SUBCUTANEOUS
  Administered 2012-07-30: 1 [IU] via SUBCUTANEOUS
  Administered 2012-07-30: 2 [IU] via SUBCUTANEOUS
  Administered 2012-07-31: 3 [IU] via SUBCUTANEOUS
  Administered 2012-07-31: 5 [IU] via SUBCUTANEOUS
  Administered 2012-07-31: 2 [IU] via SUBCUTANEOUS
  Administered 2012-08-01: 3 [IU] via SUBCUTANEOUS
  Administered 2012-08-01 – 2012-08-02 (×2): 2 [IU] via SUBCUTANEOUS
  Administered 2012-08-02: 1 [IU] via SUBCUTANEOUS
  Administered 2012-08-02 – 2012-08-03 (×2): 2 [IU] via SUBCUTANEOUS
  Administered 2012-08-03: 3 [IU] via SUBCUTANEOUS

## 2012-07-27 MED ORDER — PROCHLORPERAZINE 25 MG RE SUPP
12.5000 mg | Freq: Four times a day (QID) | RECTAL | Status: DC | PRN
  Filled 2012-07-27: qty 1

## 2012-07-27 NOTE — Progress Notes (Signed)
Pt arrived via [facility] around 1350

## 2012-07-27 NOTE — Care Management Note (Signed)
    Page 1 of 1   07/27/2012     1:26:38 PM   CARE MANAGEMENT NOTE 07/27/2012  Patient:  Billy Horn, Billy Horn   Account Number:  [id number]  Date Initiated:  07/25/2012  Documentation initiated by:  Colleen Can  Subjective/Objective Assessment:   dx tibia plateau fracture; ORIF     Action/Plan:   INPATIENT REHAB-CIR VS SNF REHAB   Anticipated DC Date:  07/29/2012   Anticipated DC Plan:  IP REHAB FACILITY  In-house referral  Clinical Social Worker      DC Associate Professor  CM consult      PAC Choice  IP REHAB   Choice offered to / List presented to:  C-1 Patient           Status of service:  Completed, signed off Medicare Important Message given?   (If response is "NO", the following Medicare IM given date fields will be blank) Date Medicare IM given:   Date Additional Medicare IM given:    Discharge Disposition:  IP REHAB FACILITY  Per UR Regulation:  Reviewed for med. necessity/level of care/duration of stay  If discussed at Long Length of Stay Meetings, dates discussed:    Comments:

## 2012-07-27 NOTE — Progress Notes (Signed)
I have approval from [facility] and will admit to acute inpatient rehab today.  #[phone]

## 2012-07-27 NOTE — H&P (Signed)
Physical Medicine and Rehabilitation Admission H&P  CC: Left tibial plateau fracture.  HPI: Billy Horn is a 67 y.o. male with history of DM, prostate cancer who fell while trying to slide furniture down some stairs and sustained an injury to his left knee on 07/23/12. Xrays revealed comminuted depressed left tibial plateau fracture. He was evaluated by Dr. Montez Morita and underwent ORIF left tibial plateau on 04/06. Post op NWB LLE and on coumadin for DVT prophylaxis. Post op fever treated with IS. Therapies ongoing and patient making good progress but limited by fatigue. Therapy team recommended CIR for progression and pt was ultimately admitted today.   Review of Systems  HENT: Negative for hearing loss and neck pain.  Eyes: Negative for blurred vision and double vision.  Respiratory: Negative for cough and shortness of breath.  Cardiovascular: Negative for chest pain and palpitations.  Gastrointestinal: Positive for constipation. Negative for heartburn and nausea.  Genitourinary: Negative for urgency and frequency.  Musculoskeletal: Positive for joint pain. Negative for myalgias and back pain.  Neurological: Negative for dizziness, tingling, sensory change and headaches.  Psychiatric/Behavioral: The patient is not nervous/anxious and does not have insomnia.   Past Medical History   Diagnosis  Date   .  Diabetes mellitus      controlled with diet and exercise   .  Cancer      prostate cancer    Past Surgical History   Procedure  Laterality  Date   .  Tonsillectomy       as a child   .  Robot assisted laparoscopic radical prostatectomy   04/06/2011     Procedure: ROBOTIC ASSISTED LAPAROSCOPIC RADICAL PROSTATECTOMY LEVEL 2; Surgeon: Crecencio Mc, MD; Location: [facility]; Service: Urology; Laterality: N/A; Bilateral Pelvic Lymphadenectomy   .  Orif tibia plateau  Left  07/23/2012     Procedure: OPEN REDUCTION INTERNAL FIXATION (ORIF) TIBIAL PLATEAU; Surgeon: [name], MD; Location: [facility];  Service: Orthopedics; Laterality: Left;    Family History   Problem  Relation  Age of Onset   .  Cancer  Mother     Social History: Lives alone. Independent and active PTA. 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 He reports that he has never smoked. He has never used smokeless tobacco. He reports that drinks alcohol. He reports that he does not use illicit drugs.  Allergies: No Known Allergies  Medications Prior to Admission   Medication  Sig  Dispense  Refill   .  methocarbamol (ROBAXIN) 500 MG tablet  Take 1 tablet (500 mg total) by mouth every 6 (six) hours as needed.  60 tablet  1   .  Multiple Vitamin (MULTIVITAMIN WITH MINERALS) TABS  Take 1 tablet by mouth daily.     Marland Kitchen  oxyCODONE (OXY IR/ROXICODONE) 5 MG immediate release tablet  Take 1-2 tablets (5-10 mg total) by mouth every 4 (four) hours as needed.  30 tablet  0   .  psyllium (METAMUCIL) 0.52 G capsule  Take 1.04 g by mouth daily.     Marland Kitchen  warfarin (COUMADIN) 2 MG tablet  Take 1 tablet (2 mg total) by mouth one time only at 6 PM.  20 tablet  0   .  zolpidem (AMBIEN) 5 MG tablet  Take 1 tablet (5 mg total) by mouth at bedtime as needed for sleep.  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, Hydrologist  Functional Status:  Mobility:  Bed Mobility  Bed Mobility: Supine to Sit  Supine to Sit: HOB elevated;5: Supervision;With rails  Transfers  Transfers: Sit to Stand;Stand to Sit  Sit to Stand: 4: Min guard;From chair/3-in-1;With upper extremity assist   Stand to Sit: 4: Min guard;With upper extremity assist;To chair/3-in-1  Ambulation/Gait  Ambulation/Gait Assistance: 4: Min guard  Ambulation Distance (Feet): 70 Feet  Assistive device: Rolling walker  Ambulation/Gait Assistance Details: 55  Gait Pattern: Step-to pattern  General Gait Details: NWB LLE, VCs for positioning in RW, Distance limted by fatigue  ADL:  ADL  Eating/Feeding: Simulated;Independent  Where Assessed - Eating/Feeding: Chair  Grooming: Simulated;Set up  Where Assessed - Grooming: Supported sitting  Upper Body Bathing: Simulated;Chest;Right arm;Left arm;Abdomen;Set up  Where Assessed - Upper Body Bathing: Unsupported sitting  Lower Body Bathing: Simulated;Moderate assistance  Where Assessed - Lower Body Bathing: Supported sit to stand  Upper Body Dressing: Simulated;Set up  Where Assessed - Upper Body Dressing: Unsupported sitting  Lower Body Dressing: Simulated;Moderate assistance  Where Assessed - Lower Body Dressing: Supported sit to Scientist, research (life sciences): Performed;Minimal assistance  Toilet Transfer Method: Other (comment) (into bathroom with RW to 3in1)  Toilet Transfer Equipment: Raised toilet seat with arms (or 3-in-1 over toilet)  Equipment Used: Long-handled sponge;Long-handled shoe horn;Sock aid;Rolling walker;Knee Immobilizer;Reacher  ADL Comments: Pt was min guard for straight path functional mobility to to the bathroom but still needed slight min assist for balance with turns and backing up to surfaces. Much improved activity tolerance today, however, and was able to tolerate to the 3in1 over the toilet and back to chair with less fatigue reported. Introduced AE for LB dressing and demosntrated all pieces. Explained AE coverage also.  Cognition:  Cognition  Arousal/Alertness: Awake/alert  Orientation Level: Oriented X4  Cognition  Overall Cognitive Status: Appears within functional limits for tasks assessed/performed  Arousal/Alertness: Awake/alert   Orientation Level: Appears intact for tasks assessed  Behavior During Session: [facility] for tasks performed  Physical Exam:  Blood pressure 140/83, pulse 91, temperature 99.8 F (37.7 C), temperature source Oral, resp. rate 18, SpO2 99.00%.  Physical Exam  Nursing note and vitals reviewed.  Constitutional: He is oriented to person, place, and time. He appears well-developed and well-nourished.  HENT:  Head: Normocephalic and atraumatic.  Dry scabbed abrasion left cheek.  Eyes: Pupils are equal, round, and reactive to light.  Cardiovascular: Normal rate and regular rhythm.  Pulmonary/Chest: Effort normal and breath sounds normal. No respiratory distress. He has no wheezes.  Abdominal: Soft. Bowel sounds are normal. He exhibits no distension. There is no tenderness.  Musculoskeletal:  Left knee with 2+ effusion. Incision LLE clean and dry with staples in place. 2+ pedal edema on left. L-biceps area with resolving ecchymosis.  Neurological: He is alert and oriented to person, place, and time. Strength functional with pain inhibition at the left leg/knee. Skin: Skin is warm and dry.   Results for orders placed during the hospital encounter of 07/23/12 (from the past 48 hour(s))   GLUCOSE, CAPILLARY Status: Abnormal    Collection Time  07/25/12 4:51 PM   Result  Value  Range    Glucose-Capillary  177 (*)  70 - 99 mg/dL   GLUCOSE, CAPILLARY Status: Abnormal    Collection Time    07/25/12 8:19 PM   Result  Value  Range    Glucose-Capillary  191 (*)  70 - 99 mg/dL   GLUCOSE, CAPILLARY Status: Abnormal    Collection Time    07/26/12 4:00 AM   Result  Value  Range    Glucose-Capillary  164 (*)  70 - 99 mg/dL   PROTIME-INR Status: Abnormal    Collection Time    07/26/12 4:40 AM   Result  Value  Range    Prothrombin Time  20.2 (*)  11.6 - 15.2 seconds    INR  1.79 (*)  0.00 - 1.49   GLUCOSE, CAPILLARY Status: Abnormal    Collection Time    07/26/12 7:36 AM   Result  Value  Range     Glucose-Capillary  212 (*)  70 - 99 mg/dL    Comment 1  Notify RN     Comment 2  Documented in Chart    GLUCOSE, CAPILLARY Status: Abnormal    Collection Time    07/26/12 12:28 PM   Result  Value  Range    Glucose-Capillary  159 (*)  70 - 99 mg/dL    Comment 1  Notify RN     Comment 2  Documented in Chart    GLUCOSE, CAPILLARY Status: Abnormal    Collection Time    07/26/12 5:40 PM   Result  Value  Range    Glucose-Capillary  155 (*)  70 - 99 mg/dL    Comment 1  Notify RN     Comment 2  Documented in Chart    PROTIME-INR Status: Abnormal    Collection Time    07/27/12 4:19 AM   Result  Value  Range    Prothrombin Time  24.0 (*)  11.6 - 15.2 seconds    INR  2.26 (*)  0.00 - 1.49   GLUCOSE, CAPILLARY Status: Abnormal    Collection Time    07/27/12 7:40 AM   Result  Value  Range    Glucose-Capillary  180 (*)  70 - 99 mg/dL   GLUCOSE, CAPILLARY Status: Abnormal    Collection Time    07/27/12 11:34 AM   Result  Value  Range    Glucose-Capillary  238 (*)  70 - 99 mg/dL    Dg Chest 2 View  04/25/1094 *RADIOLOGY REPORT* Clinical Data: Fever. CHEST - 2 VIEW Comparison: 03/31/2011 Findings: The heart size and pulmonary vascularity are normal and the lungs are clear. No acute osseous abnormality. No effusions. IMPRESSION: No acute disease. Original Report Authenticated By: Francene Boyers, M.D.   Post Admission Physician Evaluation:  1. Functional deficits secondary to left tibial plateau fx s/p ORIF. 2. Patient is admitted to receive collaborative, interdisciplinary care between the physiatrist, rehab nursing staff, and therapy team. 3. Patient's level of medical complexity and substantial therapy needs in context of that medical necessity cannot be provided at a lesser intensity of care such as a SNF. 4. Patient has experienced substantial functional loss from his/her baseline which was documented above under the "Functional History" and "Functional Status" headings. Judging by the  patient's diagnosis, physical exam, and functional history, the patient has potential for functional progress which will result in measurable gains while on inpatient rehab. These gains will be of substantial and practical  use upon discharge in facilitating mobility and self-care at the household level. 5. Physiatrist will provide 24 hour management of medical needs as well as oversight of the therapy plan/treatment and provide guidance as appropriate regarding the interaction of the two. 6. 24 hour rehab nursing will assist with bladder management, bowel management, safety, skin/wound care, disease management, medication administration, pain management and patient education and help integrate therapy concepts, techniques,education, etc. 7. PT will assess and treat for/with: Lower extremity strength, range of motion, stamina, balance, functional mobility, safety, adaptive techniques and equipment, ortho precautions. Goals are: mod I. 8. OT will assess and treat for/with: ADL's, functional mobility, safety, upper extremity strength, adaptive techniques and equipment, ortho precautions. Goals are: mod I. 9. SLP will assess and treat for/with: n/a. Goals are: n/a. 10. Case Management and Social Worker will assess and treat for psychological issues and discharge planning. 11. Team conference will be held weekly to assess progress toward goals and to determine barriers to discharge. 12. Patient will receive at least 3 hours of therapy per day at least 5 days per week. 13. ELOS: one week Prognosis: excellent   Medical Problem List and Plan:  1. DVT Prophylaxis/Anticoagulation: Pharmaceutical: Coumadin  2. Pain Management: prn oxycodone is effective. Will continue. Encouraged use of ice post therapies.  3. Mood: Motivated and has good/realistic outlook. LCSW to follow for evaluation.  4. Neuropsych: This patient is capable of making decisions on his/her own behalf.  5. DM diet controlled: will monitor BS  with AC/HS cbg checks and use SSI to controlelevated BS. Should Improve with increase in activity level.  6. Constipation: will schedule miralax daily. 7. Leukocytosis: recheck labs in am. Still with low grade temp yesterday.   -no alarming signs on exam today.  Ranelle Oyster, MD, [place] [facility]

## 2012-07-27 NOTE — Plan of Care (Signed)
Overall Plan of Care [facility]) Patient Details Name: Billy Horn MRN: [id number] DOB: 07-30-1945  Diagnosis:  Left tibial plateau fracture  Co-morbidities: Diabetes mellitus  controlled with diet and exercise  .  Cancer  prostate cancer    Functional Problem List  Patient demonstrates impairments in the following areas: Balance, Edema, Endurance, Pain and Skin Integrity  Basic ADL's: ADL retraining, toileting transfers, tub/shower transfers. Advanced ADL's: simple meal preparation, laundry and light housekeeping  Transfers:  bed mobility, bed to chair, toilet, tub/shower, car and furniture Locomotion:  ambulation, wheelchair mobility and stairs  Additional Impairments:  Other  Anticipated Outcomes Item Anticipated Outcome  Eating/Swallowing    Basic self-care  Mod I (including toilet/tub transfers)  Mod I-S  Advanced ADL's (simple meal prep, homemaking, laundry etc)   Tolieting  Mod I  Bowel/Bladder    Transfers  Mod I; S car  Locomotion  Mod I short distance gait; mod I w/c mobility; S/min A stairs  Communication    Cognition    Pain  Min assist  Safety/Judgment    Other     Therapy Plan: PT Intensity: Minimum of 1-2 x/day ,45 to 90 minutes PT Frequency: 5 out of 7 days PT Duration Estimated Length of Stay: 7 days  OT Intensity: Minimum of 1-2 x/day, 45 to 90 minutes OT Frequency: 5 out of 7 days  OT Duration Estimated LOS: 7 Days   Team Interventions: Item RN PT OT SLP SW TR Other  Self Care/Advanced ADL Retraining  x x      Neuromuscular Re-Education  x x      Therapeutic Activities  x x      UE/LE Strength Training/ROM x x x      UE/LE Coordination Activities x x x      Visual/Perceptual Remediation/Compensation         DME/Adaptive Equipment Instruction  x x      Therapeutic Exercise x x x      Balance/Vestibular Training  x x      Patient/Family Education x x x      Cognitive Remediation/Compensation         Functional Mobility Training  x  x      Ambulation/Gait Training x x x      Stair Training x x       Wheelchair Propulsion/Positioning  x x      Functional Tourist information centre manager Reintegration  x       Dysphagia/Aspiration Film/video editor         Bladder Management         Bowel Management         Disease Management/Prevention  x x      Pain Management  x x      Medication Management         Skin Care/Wound Management  x x      Splinting/Orthotics  x x      Discharge Planning  x x      Psychosocial Support  x x                             Team Discharge Planning: Destination: PT-Home ,OT-  Home , SLP-  Projected Follow-up: PT-Home health PT, OT-  Home health OT , SLP-  Projected Equipment Needs: PT-Wheelchair (measurements);Wheelchair cushion (measurements);Rolling walker with 5"  wheels, OT-  Tub bench, 3:1, possible long handled a/e, SLP-  Patient/family involved in discharge planning: PT- Patient,  OT- Patient, SLP-   MD ELOS: 7 days Medical Rehab Prognosis:  Good Assessment: 67 yo male fell while moving furniture and sustained L tibial plateau fracture, ORIF on 4/5, NWB, now requiring 24/7 rehab RN/MD, CIR level PT/OT.  TReatment team will focus on safety, balance, ADLs, mobility, with goals of ModI ADL and Mobility    See Team Conference Notes for weekly updates to the plan of care

## 2012-07-27 NOTE — Progress Notes (Signed)
27 yoM with left lateral tibial fx s/p ORIF 4/6. Coumadin ordered post-op for VTE prophylaxis. Coumadin score = 4  INR therapeutic at 2.26 this morning, no CBC since 4/5. No bleeding documented.  Coumadin education completed 4/7.   Goal of Therapy:  INR 2-3    Plan:  Coumadin 2.5 mg x 1 tonight  Daily PT/INR

## 2012-07-27 NOTE — PMR Pre-admission (Signed)
PMR Admission Coordinator Pre-Admission Assessment  Patient: Billy Horn is an 67 y.o., male MRN: [id number] DOB: [birth year] Height: 6' (182.9 cm) Weight: 83.915 kg (185 lb)              Insurance Information HMO:      PPO:       PCP:       IPA:       80/20:       OTHER:  POS PRIMARY: [employer] Managed      Policy#: [id number]      Subscriber: Kenn File CM Name: Lind Covert      Phone#: [phone]     Fax#: [phone] Pre-Cert#: [id number] approved for 10 days 4/9-4/18.  Update due 08/06/12      Employer: [employer] with [employer] Benefits:  Phone #: [phone]     Name: Marion Downer. Date: 04/20/10     Deduct: $2000($1895.96 remaining      Out of Pocket Max: $5950($5807.97 remaining)      Life Max: unlimited CIR: 90% w/auth      SNF: 90% w/auth  60 days/yr Outpatient: 90%     Co-Pay: 10%  60 visits/yr Home Health: 90% w/auth  120 visits max      Co-Pay: 10% DME: 90%     Co-Pay: 10% Providers: in network  Emergency Contact Information Contact Information   Name Relation Home Work Mobile   Thompson,Brenda Significant other [phone]  [phone]     Current Medical History  Patient Admitting Diagnosis:  L tibial plateau fx  History of Present Illness:  A 67 y.o. male with history of DM, prostate cancer who fell while trying to slide furniture down some stairs and sustained an injury to his left knee on 07/23/12. Xrays revealed comminuted depressed left tibial plateau fracture. He was evaluated by Dr. Montez Morita and underwent ORIF left tibial plateau on 04/06. Post op NWB LLE and on coumadin for DVT prophylaxis. Post op fever treated with IS. Therapies ongoing and patient making good progress but limited by fatigue. Therapy team, MD, patient requesting CIR for progression.     Past Medical History  Past Medical History  Diagnosis Date  . Diabetes mellitus     controlled with diet and exercise  . Cancer     prostate cancer    Family History  family history includes Cancer in  his mother.  Prior Rehab/Hospitalizations:  No previous rehab.  Had prostate surgery in 12/12.   Current Medications  Current facility-administered medications:acetaminophen (TYLENOL) suppository 650 mg, 650 mg, Rectal, Q6H PRN, [name], MD;  acetaminophen (TYLENOL) tablet 650 mg, 650 mg, Oral, Q6H PRN, [name], MD;  bisacodyl (DULCOLAX) suppository 10 mg, 10 mg, Rectal, Once, [name], MD;  docusate sodium (COLACE) capsule 100 mg, 100 mg, Oral, BID, [name], MD, 100 mg at 07/26/12 2140 menthol-cetylpyridinium (CEPACOL) lozenge 3 mg, 1 lozenge, Oral, PRN, [name], MD;  methocarbamol (ROBAXIN) tablet 500 mg, 500 mg, Oral, Q6H PRN, [name], MD, 500 mg at 07/25/12 1452;  metoCLOPramide (REGLAN) tablet 5-10 mg, 5-10 mg, Oral, Q8H PRN, [name], MD;  ondansetron [facility]) tablet 4 mg, 4 mg, Oral, Q6H PRN, [name], MD oxyCODONE (Oxy IR/ROXICODONE) immediate release tablet 5-10 mg, 5-10 mg, Oral, Q4H PRN, [name], MD, 10 mg at 07/27/12 6301;  phenol (CHLORASEPTIC) mouth spray 1 spray, 1 spray, Mouth/Throat, PRN, [name], MD;  warfarin (COUMADIN) tablet 2.5 mg, 2.5 mg, Oral, ONCE-1800,  Theda Sers, RPH;  warfarin (COUMADIN) video, , Does not apply, Once, [name], MD Warfarin - Pharmacist Dosing Inpatient, , Does not apply, q1800, [name], MD;  zolpidem [facility]) tablet 5 mg, 5 mg, Oral, QHS PRN, [name], MD  Patients Current Diet: Carb Control  Precautions / Restrictions Precautions Precautions: Fall Restrictions Weight Bearing Restrictions: Yes LLE Weight Bearing: Non weight bearing   Prior Activity Level Community (5-7x/wk): Went out daily.  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, Hydrologist  Current Functional Level Cognition  Arousal/Alertness: Awake/alert Overall Cognitive Status: Appears within functional limits for tasks assessed/performed Orientation Level: Oriented X4    Extremity Assessment (includes Sensation/Coordination)  RUE ROM/Strength/Tone: Within functional levels  RLE ROM/Strength/Tone: Within functional levels RLE Sensation: WFL - Light Touch RLE Coordination: WFL - gross/fine motor    ADLs  Eating/Feeding: Simulated;Independent Where Assessed - Eating/Feeding: Chair Grooming: Simulated;Set up Where Assessed - Grooming: Supported sitting Upper Body Bathing: Simulated;Chest;Right arm;Left arm;Abdomen;Set up Where Assessed - Upper Body Bathing: Unsupported sitting Lower Body Bathing: Simulated;Moderate assistance Where Assessed - Lower Body Bathing: Supported sit to stand Upper Body Dressing: Simulated;Set up Where Assessed - Upper Body Dressing: Unsupported sitting Lower Body Dressing: Simulated;Moderate assistance Where Assessed - Lower Body Dressing: Supported sit to Pharmacist, hospital: Performed;Minimal Dentist Method: Other (comment) (into bathroom with RW to 3in1) Toilet Transfer Equipment: Raised toilet seat with arms (or 3-in-1 over toilet) Toileting - Clothing Manipulation and Hygiene: Simulated;Minimal assistance Where Assessed - Glass blower/designer Manipulation and Hygiene: Standing Equipment Used: Long-handled sponge;Long-handled shoe horn;Sock aid;Rolling walker;Knee Immobilizer;Reacher ADL Comments: Pt was min guard for straight path functional mobility to to the bathroom but still needed slight min assist for balance with turns and backing up to surfaces. Much improved activity tolerance today, however, and was able to tolerate to the 3in1 over the toilet and back to chair with less fatigue reported. Introduced AE for LB dressing and demosntrated all pieces.  Explained AE coverage also.     Mobility  Bed Mobility: Supine to Sit Supine to Sit: HOB elevated;5: Supervision;With rails    Transfers  Transfers: Sit to Stand;Stand to Sit Sit to Stand: 4: Min guard;From chair/3-in-1;With upper extremity assist Stand to Sit: 4: Min guard;With upper extremity assist;To chair/3-in-1    Ambulation / Gait / Stairs / Wheelchair Mobility  Ambulation/Gait Ambulation/Gait Assistance: 4: Min Government social research officer (Feet): 70 Feet Assistive device: Rolling walker Ambulation/Gait Assistance Details: 55 Gait Pattern: Step-to pattern General Gait Details: NWB LLE, VCs for positioning in RW, Distance limted by fatigue    Posture / Balance Dynamic Standing Balance Dynamic Standing - Level of Assistance: 4: Min assist    Special needs/care consideration BiPAP/CPAP No CPM No Continuous Drip IV No Dialysis No         Life Vest No Oxygen No Special Bed No Trach Size No Wound Vac (area) No      Skin Has black boot and ace wrap to L LE.  Has abrasions left arm, left face/ear.               Bowel mgmt: No BM since admission 07/23/12 Bladder mgmt: Voiding in urinal Diabetic mgmt Yes, managed with diet and exercise at home.    Previous Home Environment Living Arrangements: Alone Lives With:  Alone Available Help at Discharge: Friend(s);Available 24 hours/day Type of Home: Apartment Home Layout: One level Home Access: Stairs to enter Entrance Stairs-Rails: Left Entrance Stairs-Number of Steps: 1 flight Bathroom Shower/Tub: Engineer, manufacturing systems: Standard Home Care Services: No  Discharge Living Setting Plans for Discharge Living Setting: Patient's home;Alone;Apartment Type of Home at Discharge: Apartment Discharge Home Layout: One level Discharge Home Access: Stairs to enter Entrance Stairs-Number of Steps: 12step entry to 2nd level apartment. Do you have any problems obtaining your medications?: No  Social/Family/Support  Systems Patient Roles: Other (Comment) (Has a girlfriend.  Has no children.) Contact Information: Lawrence Santiago - GF Anticipated Caregiver: self and GF Anticipated Caregiver's Contact Information: Mosetta Anis (h) [phone] (c) [phone] Ability/Limitations of Caregiver: Salvadore Dom is retired and can assist. Caregiver Availability: Intermittent Discharge Plan Discussed with Primary Caregiver: Yes Is Caregiver In Agreement with Plan?: Yes Does Caregiver/Family have Issues with Lodging/Transportation while Pt is in Rehab?: No  Goals/Additional Needs Patient/Family Goal for Rehab: PT/OT mod I goals, no ST needs Expected length of stay: 1 week Cultural Considerations: Attends [employer] and is very active/involved. Dietary Needs: Carb Mod Med Calorie diabetic diet, thin liquids Equipment Needs: TBD Pt/Family Agrees to Admission and willing to participate: Yes Program Orientation Provided & Reviewed with Pt/Caregiver Including Roles  & Responsibilities: Yes   Decrease burden of Care through IP rehab admission: Not applicable   Possible need for SNF placement upon discharge: No   Patient Condition: This patient's condition remains as documented in the consult dated 07/26/12, in which the Rehabilitation Physician determined and documented that the patient's condition is appropriate for intensive rehabilitative care in an inpatient rehabilitation facility. Will admit to inpatient rehab today.  Preadmission Screen Completed By:  Trish Mage, 07/27/2012 9:31 AM ______________________________________________________________________   Discussed status with Dr. Riley Kill on04/09/14 at 0930 and received telephone approval for admission today.  Admission Coordinator:  Trish Mage, time1048/Date04/09/14

## 2012-07-27 NOTE — Progress Notes (Signed)
ANTICOAGULATION CONSULT NOTE - Follow Up Consult  Pharmacy Consult for Coumadin Indication: VTE prophylaxis  Recent Labs  07/25/12 0429 07/26/12 0440 07/27/12 0419  LABPROT 15.7* 20.2* 24.0*  INR 1.28 1.79* 2.26*    Estimated Creatinine Clearance: 75.7 ml/min (by C-G formula based on Cr of 1.04).  Assessment: 59 yoM with left lateral tibial fx s/p ORIF 4/6. Coumadin ordered post-op for VTE prophylaxis. Coumadin score = 4  INR therapeutic at 2.26 this morning, no CBC since 4/5. No bleeding documented.  Coumadin education completed 4/7.  Plan discharge today  Goal of Therapy:  INR 2-3 Monitor platelets by anticoagulation protocol: Yes   Plan:   Coumadin 2.5 mg x 1 tonight  Please consider discharging patient on Coumadin 2.5 mg daily with close PT/INR follow up  Geoffry Paradise, PharmD, BCPS Pager: [phone] 7:20 AM Pharmacy #: [id number]

## 2012-07-27 NOTE — Progress Notes (Signed)
Rehab admissions - Evaluated for possible admission.  I met with patient this morning.  He would like acute inpatient rehab admission.  I have called and faxed information to [facility] yesterday.  Called [employer] again this morning.  Awaiting decision from [facility] regarding possible acute inpatient rehab admission today.  Call me for questions.  #[phone]

## 2012-07-28 ENCOUNTER — Inpatient Hospital Stay (HOSPITAL_COMMUNITY): Payer: Managed Care, Other (non HMO)

## 2012-07-28 ENCOUNTER — Inpatient Hospital Stay (HOSPITAL_COMMUNITY): Payer: Managed Care, Other (non HMO) | Admitting: Physical Therapy

## 2012-07-28 ENCOUNTER — Inpatient Hospital Stay (HOSPITAL_COMMUNITY): Payer: Managed Care, Other (non HMO) | Admitting: Occupational Therapy

## 2012-07-28 ENCOUNTER — Inpatient Hospital Stay (HOSPITAL_COMMUNITY): Payer: Managed Care, Other (non HMO) | Admitting: *Deleted

## 2012-07-28 DIAGNOSIS — W19XXXA Unspecified fall, initial encounter: Secondary | ICD-10-CM

## 2012-07-28 DIAGNOSIS — D62 Acute posthemorrhagic anemia: Secondary | ICD-10-CM

## 2012-07-28 DIAGNOSIS — S82109A Unspecified fracture of upper end of unspecified tibia, initial encounter for closed fracture: Secondary | ICD-10-CM

## 2012-07-28 LAB — GLUCOSE, CAPILLARY
Glucose-Capillary: 149 mg/dL — ABNORMAL HIGH (ref 70–99)
Glucose-Capillary: 228 mg/dL — ABNORMAL HIGH (ref 70–99)

## 2012-07-28 LAB — CBC WITH DIFFERENTIAL/PLATELET
Basophils Absolute: 0 10*3/uL (ref 0.0–0.1)
Basophils Relative: 0 % (ref 0–1)
Eosinophils Absolute: 0.1 10*3/uL (ref 0.0–0.7)
Lymphs Abs: 1.1 10*3/uL (ref 0.7–4.0)
MCH: 29.2 pg (ref 26.0–34.0)
MCHC: 35.5 g/dL (ref 30.0–36.0)
Neutrophils Relative %: 70 % (ref 43–77)
Platelets: 220 10*3/uL (ref 150–400)
RBC: 3.22 MIL/uL — ABNORMAL LOW (ref 4.22–5.81)
RDW: 12.8 % (ref 11.5–15.5)

## 2012-07-28 LAB — COMPREHENSIVE METABOLIC PANEL
ALT: 19 U/L (ref 0–53)
AST: 21 U/L (ref 0–37)
Albumin: 2.6 g/dL — ABNORMAL LOW (ref 3.5–5.2)
Alkaline Phosphatase: 77 U/L (ref 39–117)
Calcium: 8.3 mg/dL — ABNORMAL LOW (ref 8.4–10.5)
Potassium: 3.9 mEq/L (ref 3.5–5.1)
Sodium: 131 mEq/L — ABNORMAL LOW (ref 135–145)
Total Protein: 6.3 g/dL (ref 6.0–8.3)

## 2012-07-28 LAB — PROTIME-INR
INR: 2.42 — ABNORMAL HIGH (ref 0.00–1.49)
Prothrombin Time: 25.2 seconds — ABNORMAL HIGH (ref 11.6–15.2)

## 2012-07-28 LAB — HEMOGLOBIN A1C: Hgb A1c MFr Bld: 7 % — ABNORMAL HIGH (ref <5.7)

## 2012-07-28 MED ORDER — WARFARIN SODIUM 2.5 MG PO TABS
2.5000 mg | ORAL_TABLET | Freq: Once | ORAL | Status: AC
  Administered 2012-07-28: 2.5 mg via ORAL
  Filled 2012-07-28: qty 1

## 2012-07-28 MED ORDER — FERROUS SULFATE 325 (65 FE) MG PO TABS
325.0000 mg | ORAL_TABLET | Freq: Two times a day (BID) | ORAL | Status: DC
  Administered 2012-07-28 – 2012-08-03 (×13): 325 mg via ORAL
  Filled 2012-07-28 (×15): qty 1

## 2012-07-28 NOTE — Progress Notes (Signed)
ANTICOAGULATION CONSULT NOTE - Follow Up Consult  Pharmacy Consult for coumadin Indication: VTE prophylaxis  No Known Allergies  Patient Measurements:   Heparin Dosing Weight:   Vital Signs: Temp: 98.6 F (37 C) (04/10 0611) Temp src: Oral (04/10 0611) BP: 158/78 mmHg (04/10 0611) Pulse Rate: 82 (04/10 0611)  Labs:  Recent Labs  07/26/12 0440 07/27/12 0419 07/28/12 0615  HGB  --   --  9.4*  HCT  --   --  26.5*  PLT  --   --  220  LABPROT 20.2* 24.0*  --   INR 1.79* 2.26*  --   CREATININE  --   --  1.03    The CrCl is unknown because both a height and weight (above a minimum accepted value) are required for this calculation.   Medications:  Scheduled:  . ferrous sulfate  325 mg Oral BID WC  . insulin aspart  0-5 Units Subcutaneous QHS  . insulin aspart  0-9 Units Subcutaneous TID WC  . polyethylene glycol  17 g Oral Daily  . [COMPLETED] warfarin  2.5 mg Oral ONCE-1800  . Warfarin - Pharmacist Dosing Inpatient   Does not apply q1800   Infusions:    Assessment: 67 yo male s/p ORIF on 04/06 is currently on therapeutic coumadin.  INR today is 2.42 Goal of Therapy:  INR 2-3    Plan:  1) Coumadin 2.5 mg po x1 2) INR in am  Tyrik Stetzer, Tsz-Yin 07/28/2012,1:09 PM

## 2012-07-28 NOTE — Progress Notes (Signed)
Physical Therapy Session Note  Patient Details  Name: Billy Horn MRN: [id number] Date of Birth: 1945-10-11  Today's Date: 07/28/2012 Time: 1308-6578 Time Calculation (min): 46 min  Short Term Goals: Week 1:  PT Short Term Goal 1 (Week 1): = LTGs due to short LOS  Skilled Therapeutic Interventions/Progress Updates:  Tx focused on transfers, gait training, and therex for HEP.  Sit<>stand x5 throughout tx with RW with cues for hand placement with good recall, safe brake use demonstrated, min-guard assist. Stand-pivot transfers WC<>mat with min A as well.  Sit<>supine on mat with Min A for LLE lifting/lowering.   Gait training in controlled environment 2x25' with RW and min-guard assist. Lowered RW to assess optimal leverage with UEs. Distance limited by UE fatigue. NWB pattern performed safety with KI.   Seated therex including each of the following:  - WC push-ups x10 with demo and cues for scapular depression - RLE LAQ with 5 sec holds x10  Supine therex:  -ankle pumps x20 - LLE SLR in decreased ROMx10  - LLE hip ABD/ADD with assist  x10 - L quad sets x10 Handout provided  Pt returned to bed to rest with Min A for all mobility. Left with all needs in reach.    Therapy Documentation Precautions:  Precautions Precautions: Fall Required Braces or Orthoses: Knee Immobilizer - Left Knee Immobilizer - Left:  (needs clarification) Restrictions Weight Bearing Restrictions: Yes LLE Weight Bearing: Non weight bearing   Pain: None reported, modified position when discomfort with activity, ice provided following treatment.   See FIM for current functional status  Therapy/Group: Individual Therapy  Clydene Laming, PT, DPT  07/28/2012, 12:43 PM

## 2012-07-28 NOTE — Progress Notes (Signed)
Physical Therapy Session Note  Patient Details  Name: Billy Horn MRN: [id number] Date of Birth: 09/11/1945  Today's Date: 07/28/2012 Time: 0909-1009 Time Calculation (min): 60 min  Short Term Goals: Week 1:  PT Short Term Goal 1 (Week 1): = LTGs due to short LOS  Skilled Therapeutic Interventions/Progress Updates:  Session focused on basic transfer training technique with and without RW (cues intermittently for safe hand positioning), stair negotiation for home entry, w/c mobility and parts management, and overall activity tolerance. Pt has 12 steps to second floor apartment as well as 6 platform steps to get to apartment area (discussed having friend push w/c over grass in order to conserve energy so pt isn't hopping up and walking all those stairs before getting to the flight of stairs from an energy conservation standpoint) and pt agreed. Attempted going up sideways with 1 rail, but pt difficulty maintaining NWB status and determined going up on bottom would be pt's best option. Practiced transfer from w/c <-> stairs and bumping up on bottom. Only went up about 6 steps before fatigued at this time and will need to focus on this at future session. Plan to place w/c or chair at top of stair well to which pt can sit down once reaching top and not have to hop up the last step. Pt excellent worker and motivated.   Therapy Documentation Precautions:  Precautions Precautions: Fall Required Braces or Orthoses: Knee Immobilizer - Left Knee Immobilizer - Left:  (needs clarification) Restrictions Weight Bearing Restrictions: Yes LLE Weight Bearing: Non weight bearing  Pain: Reports some discomfort in LLE; premedicated.   See FIM for current functional status  Therapy/Group: Individual Therapy  Karolee Stamps [facility] 07/28/2012, 10:32 AM

## 2012-07-28 NOTE — Evaluation (Signed)
Physical Therapy Assessment and Plan  Patient Details  Name: Billy Horn MRN: [id number] Date of Birth: 1946-03-30  PT Diagnosis: Difficulty walking, Edema, Muscle weakness and Pain in joint Rehab Potential: Excellent ELOS: 7 days   Today's Date: 07/28/2012 Time: 0730 0805 Time Calculation (min): 35  Problem List:  Patient Active Problem List  Diagnosis  . Diabetes type 2-diet controlled  . Narcotic induced constipation  . Tibial plateau fracture    Past Medical History:  Past Medical History  Diagnosis Date  . Diabetes mellitus     controlled with diet and exercise  . Cancer     prostate cancer   Past Surgical History:  Past Surgical History  Procedure Laterality Date  . Tonsillectomy      as a child  . Robot assisted laparoscopic radical prostatectomy  04/06/2011    Procedure: ROBOTIC ASSISTED LAPAROSCOPIC RADICAL PROSTATECTOMY LEVEL 2;  Surgeon: Crecencio Mc, MD;  Location: [facility];  Service: Urology;  Laterality: N/A;  Bilateral Pelvic Lymphadenectomy   . Orif tibia plateau Left 07/23/2012    Procedure: OPEN REDUCTION INTERNAL FIXATION (ORIF) TIBIAL PLATEAU;  Surgeon: [name], MD;  Location: [facility]S;  Service: Orthopedics;  Laterality: Left;    Assessment & Plan Clinical Impression: Patient is a 67 y.o. year old male with history of DM, prostate cancer who fell while trying to slide furniture down some stairs and sustained an injury to his left knee on 07/23/12. Xrays revealed comminuted depressed left tibial plateau fracture. He was evaluated by Dr. Montez Morita and underwent ORIF left tibial plateau on 04/06. Post op NWB LLE and on coumadin for DVT prophylaxis. Post op fever treated with IS. Therapies ongoing and patient making good progress but limited by fatigue. Patient transferred to CIR on 07/27/2012 .   Patient currently requires min with mobility secondary to muscle weakness, decreased cardiorespiratoy endurance and decreased standing balance, decreased balance  strategies and difficulty maintaining precautions.  Prior to hospitalization, patient was independent  with mobility and lived with Alone in a Apartment home.  Home access is 1 flightStairs to enter.  Patient will benefit from skilled PT intervention to maximize safe functional mobility, minimize fall risk and decrease caregiver burden for planned discharge home with intermittent assist.  Anticipate patient will benefit from follow up [facility] at discharge.  PT Assessment Rehab Potential: Excellent Barriers to Discharge:  (will need assist with stairs for home entry) PT Plan PT Intensity: Minimum of 1-2 x/day ,45 to 90 minutes PT Frequency: 5 out of 7 days PT Duration Estimated Length of Stay: 7 days PT Treatment/Interventions: Ambulation/gait training;Balance/vestibular training;Community reintegration;Discharge planning;DME/adaptive equipment instruction;Functional mobility training;Neuromuscular re-education;Pain management;Patient/family education;Skin care/wound management;Psychosocial support;Splinting/orthotics;Stair training;Therapeutic Activities;Therapeutic Exercise;UE/LE Strength taining/ROM;UE/LE Coordination activities;Wheelchair propulsion/positioning PT Recommendation Follow Up Recommendations: Home health PT Patient destination: Home Equipment Recommended: Wheelchair (measurements);Wheelchair cushion (measurements);Rolling walker with 5" wheels   PT Evaluation Precautions/Restrictions Precautions Precautions: Fall Required Braces or Orthoses: Knee Immobilizer - Left Knee Immobilizer - Left:  (needs clarification) Restrictions Weight Bearing Restrictions: Yes LLE Weight Bearing: Non weight bearing  Pain Pain Assessment Pain Score:   4 Pain Type: Surgical pain Pain Location: Leg Pain Orientation: Left Pain Descriptors: Aching Pain Onset: With Activity (or sessile positions) Pain Intervention(s):  (RN reports coming soon) Home Living/Prior Functioning Home Living Lives  With: Alone Available Help at Discharge: Friend(s);Available 24 hours/day Type of Home: Apartment Home Access: Stairs to enter Entrance Stairs-Number of Steps: 1 flight Entrance Stairs-Rails: Left;Right (can't reach both at same time) Home Layout:  One level Bathroom Shower/Tub: Engineer, manufacturing systems: Standard Home Adaptive Equipment: None Prior Function Level of Independence: Independent with gait;Independent with basic ADLs Able to Take Stairs?: Yes Driving: Yes Vocation: Full time employment Vocation Requirements: Hydrologist, 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 Likes golf. Loves to walk. Vision/Perception  Perception Perception: Within Functional Limits Praxis Praxis: Intact  Cognition Orientation Level: Oriented X4 Sensation Sensation Light Touch: Appears Intact Proprioception: Appears Intact Motor  Motor Motor: Within Functional Limits (generalized weakness)  Mobility Bed Mobility Bed Mobility: Supine to Sit;Sit to Supine Supine to Sit: 4: Min assist Supine to Sit Details (indicate cue type and reason): min assist for Lt. LE Sit to Supine: 4: Min assist Sit to Supine - Details (indicate cue type and reason): Assist for Lt. LE Transfers Squat Pivot Transfers: 4: Min assist Locomotion  Ambulation Ambulation/Gait Assistance: 4: Min guard Stairs / Additional Locomotion Stairs: Yes Stairs Assistance: 4: Min assist Stair Management Technique: Seated/boosting;One rail Right Number of Stairs: 6 Wheelchair Mobility Wheelchair Mobility: Yes Wheelchair Assistance: 5: Investment banker, operational: Both upper extremities Wheelchair Parts Management: Supervision/cueing Distance: 100'  Trunk/Postural Assessment  Cervical Assessment Cervical Assessment: Within Functional Limits Thoracic Assessment Thoracic Assessment: Within Functional Limits Lumbar Assessment Lumbar Assessment:  Within Functional Limits  Balance Balance Balance Assessed: Yes Static Sitting Balance Static Sitting - Level of Assistance: 6: Modified independent (Device/Increase time) Dynamic Sitting Balance Dynamic Sitting - Level of Assistance: 5: Stand by assistance Static Standing Balance Static Standing - Level of Assistance: 4: Min assist Dynamic Standing Balance Dynamic Standing - Level of Assistance: 4: Min assist Extremity Assessment      RLE Assessment RLE Assessment: Within Functional Limits LLE Assessment LLE Assessment: Exceptions to WFL LLE AROM (degrees) LLE Overall AROM Comments: Decreased knee extension while positioned in knee immobilizer LLE Strength LLE Overall Strength Comments: Decreased dorsiflexion ~20 degree deficit.  FIM:  FIM - Banker Devices: Arm rests Bed/Chair Transfer: 4: Bed > Chair or W/C: Min A (steadying Pt. > 75%);4: Chair or W/C > Bed: Min A (steadying Pt. > 75%) FIM - Locomotion: Wheelchair Distance: 100' Locomotion: Wheelchair: 2: Travels 50 - 149 ft with supervision, cueing or coaxing FIM - Locomotion: Ambulation Locomotion: Ambulation Assistive Devices: Designer, industrial/product Ambulation/Gait Assistance: 4: Min guard Locomotion: Ambulation: 1: Travels less than 50 ft with minimal assistance (Pt.>75%) FIM - Locomotion: Stairs Locomotion: Building control surveyor: Radio broadcast assistant - 1;Hand rail - 2 Locomotion: Stairs: 2: Up and Down 4 - 11 stairs with minimal assistance (Pt.>75%) (using rails and bump on bottom)   Refer to Care Plan for Long Term Goals  Recommendations for other services: None  Discharge Criteria: Patient will be discharged from PT if patient refuses treatment 3 consecutive times without medical reason, if treatment goals not met, if there is a change in medical status, if patient makes no progress towards goals or if patient is discharged from hospital.  The above assessment, treatment plan, treatment  alternatives and goals were discussed and mutually agreed upon: by patient  Tedd Sias 07/28/2012, 10:25 AM

## 2012-07-28 NOTE — Progress Notes (Signed)
Patient information reviewed and entered into eRehab system by Renika Shiflet, RN, CRRN, PPS Coordinator.  Information including medical coding and functional independence measure will be reviewed and updated through discharge.    

## 2012-07-28 NOTE — Evaluation (Signed)
Occupational Therapy Assessment and Plan  Patient Details  Name: Billy Horn MRN: [id number] Date of Birth: 01-14-1946  OT Diagnosis: acute pain, muscle weakness (generalized) and pain in joint Rehab Potential: Rehab Potential: Good ELOS: 7 days   Today's Date: 07/28/2012 Time: 08:10-09:08 Time Calculation (min): 58 min  Problem List:  Patient Active Problem List  Diagnosis  . Diabetes type 2-diet controlled  . Narcotic induced constipation  . Tibial plateau fracture    Past Medical History:  Past Medical History  Diagnosis Date  . Diabetes mellitus     controlled with diet and exercise  . Cancer     prostate cancer   Past Surgical History:  Past Surgical History  Procedure Laterality Date  . Tonsillectomy      as a child  . Robot assisted laparoscopic radical prostatectomy  04/06/2011    Procedure: ROBOTIC ASSISTED LAPAROSCOPIC RADICAL PROSTATECTOMY LEVEL 2;  Surgeon: Crecencio Mc, MD;  Location: [facility];  Service: Urology;  Laterality: N/A;  Bilateral Pelvic Lymphadenectomy   . Orif tibia plateau Left 07/23/2012    Procedure: OPEN REDUCTION INTERNAL FIXATION (ORIF) TIBIAL PLATEAU;  Surgeon: [name], MD;  Location: [facility];  Service: Orthopedics;  Laterality: Left;    Assessment & Plan Clinical Impression: Patient is a 68 y.o. year old male with recent admission to the hospital on 07/23/12 after a fall that resulted in a Left comunited tibial plateau fracture. He underwent ORIF on 07/24/12. Pt lives alone and is NWB LLE w/ KI. Pt is currently on coumadin for DVT prophylaxis. He was I all ADL/IADL's PTA, he works from home and plans to return home at d/c w/ PRN assist from friends & supportive community. Patient transferred to CIR on 07/27/2012 .    Patient currently requires min with basic self-care skills secondary to Left tibial plateau fracture & ORIF, NWB LLE.  Prior to hospitalization, patient could complete ADL/IADL's with independent .  Patient will benefit from  skilled intervention to increase independence with basic self-care skills and increase level of independence with iADL prior to discharge home independently.  Anticipate patient will be  Mod I w/ intermittent/PRN assist from friends/supportive community and follow up home health.  OT - End of Session Endurance Deficit: Yes Endurance Deficit Description: Pt reports fatigue w/ toilet transfer and toileting. OT Assessment Rehab Potential: Good OT Plan OT Intensity: Minimum of 1-2 x/day, 45 to 90 minutes OT Frequency: 5 out of 7 days OT Duration/Estimated Length of Stay: 7 days OT Treatment/Interventions: Discharge planning;DME/adaptive equipment instruction;Neuromuscular re-education;Functional mobility training;Pain management;Patient/family education;Psychosocial support;Splinting/orthotics;Self Care/advanced ADL retraining;Therapeutic Activities;Therapeutic Exercise;UE/LE Strength taining/ROM;Wheelchair propulsion/positioning;UE/LE Coordination activities OT Recommendation Patient destination: Home Follow Up Recommendations: Home health OT;Other (comment) (Intermittant supervision/assist) Equipment Recommended: 3 in 1 bedside comode;Tub/shower bench   Skilled Therapeutic Intervention After evaluation, pt was seen today for individual OT treatment session, with focus on ADL retraining, functional mobility, toilet transfers, sit to stand transfers as well as activity tolerance. Pt completed bathing and LB dressing seated at the sink level w/ Min to SBA. Min A for transfers and functional mobility as well as VC's for safety/sequencing with RW, to 3:1 and w/c, sit to stand. Grooming was Mod I after items were obtained. Verbal discussion w/ pt whom reports that friend will be bringing additional clothing as well as electric razor as pt is on coumadin. Also discussed Role of OT and therapy strategies to assist in maximizing I w/ all ADL's, homemaking and simple meal prep. Pt verbalized understanding of  this.  OT Evaluation Precautions/Restrictions  Precautions Precautions: Fall Required Braces or Orthoses: Knee Immobilizer - Left Knee Immobilizer - Left:  (needs clarification) Restrictions Weight Bearing Restrictions: Yes LLE Weight Bearing: Non weight bearing     Pain Pain Assessment Pain Score:   4 Pain Type: Surgical pain Pain Location: Leg Pain Orientation: Left Pain Descriptors: Aching Pain Onset: With Activity (or sessile positions) Pain Intervention(s):  (RN reports coming soon) Home Living/Prior Functioning Home Living Lives With: Alone Available Help at Discharge: Friend(s);Available 24 hours/day Type of Home: Apartment Home Access: Stairs to enter Entrance Stairs-Number of Steps: 1 flight Entrance Stairs-Rails: Left;Right (can't reach both at same time) Home Layout: One level Bathroom Shower/Tub: Engineer, manufacturing systems: Standard Home Adaptive Equipment: None Prior Function Level of Independence: Independent with gait;Independent with basic ADLs Able to Take Stairs?: Yes Driving: Yes Vocation: Full time employment Vocation Requirements: Hydrologist, 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 Likes golf. Loves to walk.   Vision/Perception  Perception Perception: Within Functional Limits Praxis Praxis: Intact  Cognition Orientation Level: Oriented X4 Sensation Sensation Light Touch: Appears Intact Proprioception: Appears Intact Motor  Motor Motor: Within Functional Limits (generalized weakness) Mobility  Bed Mobility Bed Mobility: Supine to Sit;Sit to Supine Supine to Sit: 4: Min assist Supine to Sit Details (indicate cue type and reason): min assist for Lt. LE Sit to Supine: 4: Min assist Sit to Supine - Details (indicate cue type and reason): Assist for Lt. LE  Trunk/Postural Assessment  Cervical Assessment Cervical Assessment: Within Functional Limits Thoracic  Assessment Thoracic Assessment: Within Functional Limits Lumbar Assessment Lumbar Assessment: Within Functional Limits  Balance Balance Balance Assessed: Yes Static Sitting Balance Static Sitting - Level of Assistance: 6: Modified independent (Device/Increase time) Dynamic Sitting Balance Dynamic Sitting - Level of Assistance: 5: Stand by assistance Static Standing Balance Static Standing - Level of Assistance: 4: Min assist Dynamic Standing Balance Dynamic Standing - Level of Assistance: 4: Min assist Extremity/Trunk Assessment RUE Assessment RUE Assessment: Within Functional Limits LUE Assessment LUE Assessment: Within Functional Limits  FIM:  FIM - Eating Eating Activity: 7: Complete independence:no helper FIM - Grooming Grooming Steps: Wash, rinse, dry face;Wash, rinse, dry hands;Oral care, brush teeth, clean dentures Grooming: 5: Set-up assist to obtain items FIM - Bathing Bathing Steps Patient Completed: Chest;Right Arm;Left Arm;Abdomen;Front perineal area;Buttocks;Right upper leg;Left upper leg Bathing: 4: Min-Patient completes 8-9 68f 10 parts or 75+ percent FIM - Upper Body Dressing/Undressing Upper body dressing/undressing steps patient completed: Thread/unthread left sleeve of pullover shirt/dress;Thread/unthread right sleeve of pullover shirt/dresss;Put head through opening of pull over shirt/dress;Pull shirt over trunk Upper body dressing/undressing: 5: Set-up assist to: Obtain clothing/put away FIM - Lower Body Dressing/Undressing Lower body dressing/undressing steps patient completed: Thread/unthread right pants leg;Thread/unthread left pants leg;Pull pants up/down;Don/Doff right sock Lower body dressing/undressing: 4: Min-Patient completed 75 plus % of tasks FIM - Toileting Toileting steps completed by patient: Adjust clothing prior to toileting;Performs perineal hygiene;Adjust clothing after toileting Toileting Assistive Devices: Grab bar or rail for  support Toileting: 4: Steadying assist FIM - Banker Devices: Arm rests Bed/Chair Transfer: 4: Bed > Chair or W/C: Min A (steadying Pt. > 75%);4: Chair or W/C > Bed: Min A (steadying Pt. > 75%) FIM - Archivist Transfers Assistive Devices: Elevated toilet seat;Grab bars;Walker Toilet Transfers: 4-To toilet/BSC: Min A (steadying Pt. > 75%);3-From toilet/BSC: Mod A (lift or lower assist) FIM - Tub/Shower Transfers Tub/shower Transfers: 0-Activity did  not occur or was simulated   Refer to Care Plan for Long Term Goals  Recommendations for other services: None  Discharge Criteria: Patient will be discharged from OT if patient refuses treatment 3 consecutive times without medical reason, if treatment goals not met, if there is a change in medical status, if patient makes no progress towards goals or if patient is discharged from hospital.  The above assessment, treatment plan, treatment alternatives and goals were discussed and mutually agreed upon: by patient  Alm Bustard 07/28/2012, 11:20 AM

## 2012-07-28 NOTE — Progress Notes (Addendum)
Subjective/Complaints: No complaints. Had a good night.  A 12 point review of systems has been performed and if not noted above is otherwise negative.   Objective: Vital Signs: Blood pressure 158/78, pulse 82, temperature 98.6 F (37 C), temperature source Oral, resp. rate 20, SpO2 96.00%. No results found.  Recent Labs  07/28/12 0615  WBC 7.2  HGB 9.4*  HCT 26.5*  PLT 220    Recent Labs  07/28/12 0615  NA 131*  K 3.9  CL 97  GLUCOSE 271*  BUN 17  CREATININE 1.03  CALCIUM 8.3*   CBG (last 3)   Recent Labs  07/27/12 1134 07/27/12 1655 07/27/12 2046  GLUCAP 238* 195* 169*    Wt Readings from Last 3 Encounters:  07/24/12 83.915 kg (185 lb)  07/24/12 83.915 kg (185 lb)  04/06/11 90.7 kg (199 lb 15.3 oz)    Physical Exam:  Constitutional: He is oriented to person, place, and time. He appears well-developed and well-nourished.  HENT:  Head: Normocephalic and atraumatic.  Dry scabbed abrasion left cheek.  Eyes: Pupils are equal, round, and reactive to light.  Cardiovascular: Normal rate and regular rhythm.  Pulmonary/Chest: Effort normal and breath sounds normal. No respiratory distress. He has no wheezes.  Abdominal: Soft. Bowel sounds are normal. He exhibits no distension. There is no tenderness.  Musculoskeletal:  Left knee with mild effusion. Incision LLE clean and dry with staples in place. Area is appropriately tender.  1+ pedal edema on left. L-biceps area with resolving ecchymosis.  Neurological: He is alert and oriented to person, place, and time. Strength functional with pain inhibition at the left leg/knee.  Skin: Skin is warm and dry.    Assessment/Plan: 1. Functional deficits secondary to left tibial plateau fx which require 3+ hours per day of interdisciplinary therapy in a comprehensive inpatient rehab setting. Physiatrist is providing close team supervision and 24 hour management of active medical problems listed below. Physiatrist and rehab  team continue to assess barriers to discharge/monitor patient progress toward functional and medical goals. FIM:          FIM - Diplomatic Services operational officer Devices: Bedside commode Toilet Transfers: 4-To toilet/BSC: Min A (steadying Pt. > 75%);3-From toilet/BSC: Mod A (lift or lower assist)        Comprehension Comprehension Mode: Auditory Comprehension: 7-Follows complex conversation/direction: With no assist  Expression Expression Mode: Verbal Expression: 7-Expresses complex ideas: With no assist  Social Interaction Social Interaction: 7-Interacts appropriately with others - No medications needed.  Problem Solving Problem Solving: 7-Solves complex problems: Recognizes & self-corrects  Memory Memory: 7-Complete Independence: No helper Medical Problem List and Plan:  1. DVT Prophylaxis/Anticoagulation: Pharmaceutical: Coumadin  2. Pain Management: prn oxycodone is effective. Will continue. Encouraged use of ice post therapies. KI 3. Mood: Motivated and has good/realistic outlook. LCSW to follow for evaluation.  4. Neuropsych: This patient is capable of making decisions on his/her own behalf.  5. DM diet controlled: will monitor BS with AC/HS cbg checks and use SSI to controlelevated BS. Should Improve with increase in activity level.   -follow for pattern 6. Constipation: will schedule miralax daily.  7. Leukocytosis: resolved, afebrile 8. ABLA: Ferrous sulfate   LOS (Days) 1 A FACE TO FACE EVALUATION WAS PERFORMED  SWARTZ,ZACHARY T 07/28/2012 7:44 AM

## 2012-07-29 ENCOUNTER — Inpatient Hospital Stay (HOSPITAL_COMMUNITY): Payer: Managed Care, Other (non HMO) | Admitting: *Deleted

## 2012-07-29 ENCOUNTER — Inpatient Hospital Stay (HOSPITAL_COMMUNITY): Payer: Managed Care, Other (non HMO)

## 2012-07-29 DIAGNOSIS — S82109A Unspecified fracture of upper end of unspecified tibia, initial encounter for closed fracture: Secondary | ICD-10-CM

## 2012-07-29 DIAGNOSIS — D62 Acute posthemorrhagic anemia: Secondary | ICD-10-CM

## 2012-07-29 DIAGNOSIS — W19XXXA Unspecified fall, initial encounter: Secondary | ICD-10-CM

## 2012-07-29 LAB — GLUCOSE, CAPILLARY
Glucose-Capillary: 214 mg/dL — ABNORMAL HIGH (ref 70–99)
Glucose-Capillary: 180 mg/dL — ABNORMAL HIGH (ref 70–99)

## 2012-07-29 LAB — PROTIME-INR: INR: 2.56 — ABNORMAL HIGH (ref 0.00–1.49)

## 2012-07-29 MED ORDER — WARFARIN SODIUM 2.5 MG PO TABS
2.5000 mg | ORAL_TABLET | Freq: Once | ORAL | Status: AC
  Administered 2012-07-29: 2.5 mg via ORAL
  Filled 2012-07-29: qty 1

## 2012-07-29 NOTE — Progress Notes (Signed)
Notified by NT patient's BP 160/91. Delle Reining, PA made aware and order to recheck within an hour.  Recheck at 1657 BP 137/88.  Patient denies having symptoms.  Will continue to monitor.

## 2012-07-29 NOTE — Progress Notes (Signed)
Social Work Assessment and Plan Social Work Assessment and Plan  Patient Details  Name: Billy Horn MRN: [id number] Date of Birth: 09-13-1945  Today's Date: 07/29/2012  Problem List:  Patient Active Problem List  Diagnosis  . Diabetes type 2-diet controlled  . Narcotic induced constipation  . Tibial plateau fracture   Past Medical History:  Past Medical History  Diagnosis Date  . Diabetes mellitus     controlled with diet and exercise  . Cancer     prostate cancer   Past Surgical History:  Past Surgical History  Procedure Laterality Date  . Tonsillectomy      as a child  . Robot assisted laparoscopic radical prostatectomy  04/06/2011    Procedure: ROBOTIC ASSISTED LAPAROSCOPIC RADICAL PROSTATECTOMY LEVEL 2;  Surgeon: Crecencio Mc, MD;  Location: [facility];  Service: Urology;  Laterality: N/A;  Bilateral Pelvic Lymphadenectomy   . Orif tibia plateau Left 07/23/2012    Procedure: OPEN REDUCTION INTERNAL FIXATION (ORIF) TIBIAL PLATEAU;  Surgeon: [name], MD;  Location: [facility];  Service: Orthopedics;  Laterality: Left;   Social History:  reports that he has never smoked. He has never used smokeless tobacco. He reports that  drinks alcohol. He reports that he does not use illicit drugs.  Family / Support Systems Marital Status: Divorced Patient Roles: Other (Comment) (Has a girlfriend.  Has no children.) Children: None Other Supports: friend, Lawrence Santiago @ [phone] or ([phone] Anticipated Caregiver: self and GF Ability/Limitations of Caregiver: Salvadore Dom is retired and can assist. Caregiver Availability: Intermittent Family Dynamics: very self-reliant - anticipates help "as needed" from friends.  Social History Preferred language: English Religion: Protestant Cultural Background: NA Education: college Read: Yes Write: Yes Employment Status: Employed Name of Employer: Advertising copywriter Return to Work Plans: plans to return to his work (works from home) "as soon  as I feel up to itRetail buyer Issues: None Guardian/Conservator: None   Abuse/Neglect Physical Abuse: Denies Verbal Abuse: Denies Sexual Abuse: Denies Exploitation of patient/patient's resources: Denies Self-Neglect: Denies  Emotional Status Pt's affect, behavior adn adjustment status: Pt very pleasant, talkative and fully oriented.  Admits frustrations with fall and injuries, however, denies any significant emotional distress.  Will monitor. Recent Psychosocial Issues: None Pyschiatric History: None Substance Abuse History: None  Patient / Family Perceptions, Expectations & Goals Pt/Family understanding of illness & functional limitations: pt with good understanding of his fx and of his WB and functional limitations. Premorbid pt/family roles/activities: Very independent gentleman - living alone and active with work, church and in community. Anticipated changes in roles/activities/participation: Anticipate mod i goals, therefore only slight, temporary changes with friends proviing caregiving assist as needed. Pt/family expectations/goals: Pt hopeful he will be mod i upon d/c   [employer]: None Premorbid Home Care/DME Agencies: None Transportation available at discharge: yes  Discharge Planning Living Arrangements: Alone Support Systems: Friends/neighbors;Church/faith community Type of Residence: Private residence Insurance Resources: Media planner (specify) Administrator) Financial Resources: Employment Financial Screen Referred: No Living Expenses: Own Money Management: Patient Do you have any problems obtaining your medications?: No Home Management: pt Patient/Family Preliminary Plans: pt plans to d/c to his own home with friends assisting intermittently Barriers to Discharge: Steps (must be able to manage a full flight of steps to enter) Expected length of stay: 1 week  Clinical Impression Very pleasant gentleman here after  fall and tibial plateau fx.  Lives alone and must reach a level that he can get up/down a flight  of steps to enter home. Good support of friends.  Anticipate short LOS  Louis Gaw 07/29/2012, 2:08 PM

## 2012-07-29 NOTE — Progress Notes (Signed)
Physical Therapy Session Note  Patient Details  Name: Billy Horn MRN: [id number] Date of Birth: [birth year]  Today's Date: 07/29/2012 Time: 0827-0927 Time Calculation (min): 60 min  Short Term Goals: Week 1:  PT Short Term Goal 1 (Week 1): = LTGs due to short LOS  Skilled Therapeutic Interventions/Progress Updates:    Completing brushing his teeth at sink mod I using w/c and then propelled w/c down to therapy gym. Dynamic gait through obstacle course to simulate home environment gait including turning and stepping over threshold with overall min A, cues for technique. Stair training transferring from w/c to stairs bumping up/down on bottom and practicing transfer at top of stairs at well with overall min A. Pt fatigued. Discussed the 6 platform steps that pt has to access the flight of stairs from an energy perspective standpoint. Practiced curb step going backwards with min A; pt stated he would not feel able to do that 6 times right now and then a flight of stairs. Emphasis on pt managing w/c parts in preparation for transfers requiring cues and some assist intermittently for L legrest. Applied ice to L knee at end of session for pain relief.   Therapy Documentation Precautions:  Precautions Precautions: Fall Required Braces or Orthoses: Knee Immobilizer - Left Knee Immobilizer - Left:  (needs clarification) Restrictions Weight Bearing Restrictions: Yes LLE Weight Bearing: Non weight bearing  Pain: Just received pain medication for LLE.  See FIM for current functional status  Therapy/Group: Individual Therapy  Karolee Stamps [facility] 07/29/2012, 9:30 AM

## 2012-07-29 NOTE — Progress Notes (Signed)
ANTICOAGULATION CONSULT NOTE - Follow Up Consult  Pharmacy Consult for coumadin Indication: VTE prophylaxis  No Known Allergies  Patient Measurements:   Heparin Dosing Weight:   Vital Signs: Temp: 98.3 F (36.8 C) (04/11 0644) Temp src: Oral (04/11 0644) BP: 147/83 mmHg (04/11 0644) Pulse Rate: 77 (04/11 0644)  Labs:  Recent Labs  07/27/12 0419 07/28/12 0615 07/28/12 1256 07/29/12 0602  HGB  --  9.4*  --   --   HCT  --  26.5*  --   --   PLT  --  220  --   --   LABPROT 24.0*  --  25.2* 26.3*  INR 2.26*  --  2.42* 2.56*  CREATININE  --  1.03  --   --     The CrCl is unknown because both a height and weight (above a minimum accepted value) are required for this calculation.   Medications:  Scheduled:  . ferrous sulfate  325 mg Oral BID WC  . insulin aspart  0-5 Units Subcutaneous QHS  . insulin aspart  0-9 Units Subcutaneous TID WC  . polyethylene glycol  17 g Oral Daily  . [COMPLETED] warfarin  2.5 mg Oral ONCE-1800  . Warfarin - Pharmacist Dosing Inpatient   Does not apply q1800   Infusions:    Assessment: 67 yo male s/p ORIF is currently on therapeutic coumadin for VTE prophylaxis.  INR today is 2.56 Goal of Therapy:  INR 2-3    Plan:  1. Coumadin 2.5mg  po x1 2. Daily PT/INR   Laryah Neuser, Tsz-Yin 07/29/2012,8:24 AM

## 2012-07-29 NOTE — Progress Notes (Signed)
[facility] Individual Statement of Services  Patient Name:  Billy Horn  Date:  07/29/2012  Welcome to the [facility].  Our goal is to provide you with an individualized program based on your diagnosis and situation, designed to meet your specific needs.  With this comprehensive rehabilitation program, you will be expected to participate in at least 3 hours of rehabilitation therapies Monday-Friday, with modified therapy programming on the weekends.  Your rehabilitation program will include the following services:  Physical Therapy (PT), Occupational Therapy (OT), 24 hour per day rehabilitation nursing, Therapeutic Recreaction (TR), Case Management (Social Worker), Rehabilitation Medicine, Nutrition Services and Pharmacy Services  Weekly team conferences will be held on Tuesdays to discuss your progress.  Your Social Worker will talk with you frequently to get your input and to update you on team discussions.  Team conferences with you and your family in attendance may also be held.  Expected length of stay: 7 days  Overall anticipated outcome: modified independent  Depending on your progress and recovery, your program may change. Your Social Worker will coordinate services and will keep you informed of any changes. Your Social Worker's name and contact numbers are listed  below.  The following services may also be recommended but are not provided by the [facility]:   Driving Evaluations  Home Health Rehabiltiation Services  Outpatient Rehabilitatation [facility]  Vocational Rehabilitation   Arrangements will be made to provide these services after discharge if needed.  Arrangements include referral to agencies that provide these services.  Your insurance has been verified to be:  Community education officer Your primary doctor is:  Dr. Clovis Riley  Pertinent information will be shared with your doctor and your insurance company.  Social Worker:   [place], [place] [phone] or ([phone]  Information discussed with and copy given to patient by: Amada Jupiter, 07/29/2012, 2:24 PM

## 2012-07-29 NOTE — Progress Notes (Signed)
Physical Therapy Session Note  Patient Details  Name: Billy Horn MRN: [id number] Date of Birth: [birth year]  Today's Date: 07/29/2012 Time: 1530-1600 Time Calculation (min): 30 min  Short Term Goals: Week 1:  PT Short Term Goal 1 (Week 1): = LTGs due to short LOS  Skilled Therapeutic Interventions/Progress Updates:    session focused on household environment gait in ADL apartment with close S, furniture transfers, bed mobility on soft bed, and simulated car transfer into SUV height. Discussed recommendations for car to increase comfort and ease of managing LLE including sliding the seat as far as back as it goes or sitting in long sitting in the back seat of the car (due to pt's height and long limbs). W/c propulsion on unit for general endurance and strengthening.   Therapy Documentation Precautions:  Precautions Precautions: Fall Required Braces or Orthoses: Knee Immobilizer - Left Knee Immobilizer - Left:  (needs clarification) Restrictions Weight Bearing Restrictions: Yes LLE Weight Bearing: Non weight bearing   Pain: Ice applied to knee at end of session due to increased soreness.  See FIM for current functional status  Therapy/Group: Individual Therapy  Karolee Stamps [facility] 07/29/2012, 4:25 PM

## 2012-07-29 NOTE — Progress Notes (Signed)
Occupational Therapy Session Note  Patient Details  Name: Billy Horn MRN: [id number] Date of Birth: 06-Oct-1945  Today's Date: 07/29/2012 Time:1000-1100   1(st session) Time Calculation (min): 60 min  Short Term Goals: Week 1:  OT Short Term Goal 1 (Week 1): Pt will be Mod I toilet transfers, hygeine & clothing management OT Short Term Goal 1 - Progress (Week 1): Other (comment) (Set today) OT Short Term Goal 2 (Week 1): Pt will be Mod I UB/LB dressing/bathing w/ sit-stand from seated position w/ A/E PRN OT Short Term Goal 2 - Progress (Week 1): Other (comment) (Set today) OT Short Term Goal 3 (Week 1): Pt will be Mod I-supervision tub transfer into tub/shower combo using tub bench OT Short Term Goal 3 - Progress (Week 1): Other (comment) (Set today) OT Short Term Goal 4 (Week 1): Pt will be Mod I simple meal prep & homemaking tasks while ambulating &/or from w/c level OT Short Term Goal 4 - Progress (Week 1): Other (comment) (set today) OT Short Term Goal 5 (Week 1): Pt will demonstrate Independence w/ NWB LLE during functional mobility, transfers and ADL/IADL's  OT Short Term Goal 5 - Progress (Week 1): Other (comment) (Set today)  Skilled Therapeutic Interventions/Progress Updates:      1st session :Pt. Sitting in wc upon OT arrival.  Pt. Sat at sink and performed grooming.  OT addressed shower transfers, sit to stand, standing tolerance, endurance, functional mobility.  Pt. Ambulated with RW to shower stall.   OT wrapped RLE in plastic.Pt sat on shower chair facing outward due to decreased knee flexion.  Pt. Performed bathing and dressing with minimal assist with  Left LE.  Pt.needed minimal assist for standing balance when donning shorts.     Therapy Documentation Precautions:  Precautions Precautions: Fall Required Braces or Orthoses: Knee Immobilizer - Left Knee Immobilizer - Left:  (needs clarification) Restrictions Weight Bearing Restrictions: Yes LLE Weight Bearing: Non  weight bearing    Pain: Pain Assessment Pain Assessment: 0-10 Pain Score:   4 Pain Location: Leg Pain Orientation: Left Pain Descriptors: Aching Patients Stated Pain Goal: 2 Pain Intervention(s): Medication (See eMAR) :   Other Treatments:    2nd session:  1300-1345  (45 min)  Pain=  4/10  Individual session.   Pt. Propelled wc down to ADL apartment.  Addressed tub shower transfer, sit to stand, and functional ambulation in bathroom.  Educated pt on using a sheet to assist with lifting left leg over side of tub.  Pt. Demonstrated good strategy and did with supervision.  Pt. Propelled wc with no assist and was independent with locking foot pedals in place.     See FIM for current functional status  Therapy/Group: Individual Therapy  Humberto Seals 07/29/2012, 10:27 AM

## 2012-07-29 NOTE — Progress Notes (Signed)
Patient ID: Billy Horn, male   DOB: 03/19/1946, 67 y.o.   MRN: [id number] Subjective/Complaints: No complaints. Had a good night. Would like a shower A 12 point review of systems has been performed and if not noted above is otherwise negative.   Objective: Vital Signs: Blood pressure 147/83, pulse 77, temperature 98.3 F (36.8 C), temperature source Oral, resp. rate 18, SpO2 96.00%. No results found.  Recent Labs  07/28/12 0615  WBC 7.2  HGB 9.4*  HCT 26.5*  PLT 220    Recent Labs  07/28/12 0615  NA 131*  K 3.9  CL 97  GLUCOSE 271*  BUN 17  CREATININE 1.03  CALCIUM 8.3*   CBG (last 3)   Recent Labs  07/28/12 1626 07/28/12 2059 07/29/12 0711  GLUCAP 209* 228* 214*    Wt Readings from Last 3 Encounters:  07/24/12 83.915 kg (185 lb)  07/24/12 83.915 kg (185 lb)  04/06/11 90.7 kg (199 lb 15.3 oz)    Physical Exam:  Constitutional: He is oriented to person, place, and time. He appears well-developed and well-nourished.  HENT:  Head: Normocephalic and atraumatic.  Dry scabbed abrasion left cheek.  Eyes: Pupils are equal, round, and reactive to light.  Cardiovascular: Normal rate and regular rhythm.  Pulmonary/Chest: Effort normal and breath sounds normal. No respiratory distress. He has no wheezes.  Abdominal: Soft. Bowel sounds are normal. He exhibits no distension. There is no tenderness.  Musculoskeletal:  Left knee with mild effusion.. Area is appropriately tender.  1+ pedal edema on left. L-biceps area with resolving ecchymosis.  Neurological: He is alert and oriented to person, place, and time. Strength functional with pain inhibition at the left leg/knee.  Skin: Skin is warm and dry.    Assessment/Plan: 1. Functional deficits secondary to left tibial plateau fx which require 3+ hours per day of interdisciplinary therapy in a comprehensive inpatient rehab setting. Physiatrist is providing close team supervision and 24 hour management of active medical  problems listed below. Physiatrist and rehab team continue to assess barriers to discharge/monitor patient progress toward functional and medical goals. FIM: FIM - Bathing Bathing Steps Patient Completed: Chest;Right Arm;Left Arm;Abdomen;Front perineal area;Buttocks;Right upper leg;Left upper leg Bathing: 4: Min-Patient completes 8-9 72f 10 parts or 75+ percent  FIM - Upper Body Dressing/Undressing Upper body dressing/undressing steps patient completed: Thread/unthread left sleeve of pullover shirt/dress;Thread/unthread right sleeve of pullover shirt/dresss;Put head through opening of pull over shirt/dress;Pull shirt over trunk Upper body dressing/undressing: 5: Set-up assist to: Obtain clothing/put away FIM - Lower Body Dressing/Undressing Lower body dressing/undressing steps patient completed: Thread/unthread right pants leg;Thread/unthread left pants leg;Pull pants up/down;Don/Doff right sock Lower body dressing/undressing: 4: Min-Patient completed 75 plus % of tasks  FIM - Toileting Toileting steps completed by patient: Adjust clothing prior to toileting;Performs perineal hygiene;Adjust clothing after toileting Toileting Assistive Devices: Grab bar or rail for support Toileting: 4: Steadying assist  FIM - Diplomatic Services operational officer Devices: Grab bars;Walker;Elevated toilet seat Toilet Transfers: 4-To toilet/BSC: Min A (steadying Pt. > 75%);4-From toilet/BSC: Min A (steadying Pt. > 75%)  FIM - Banker Devices: Walker;Arm rests Bed/Chair Transfer: 4: Supine > Sit: Min A (steadying Pt. > 75%/lift 1 leg);4: Sit > Supine: Min A (steadying pt. > 75%/lift 1 leg);4: Bed > Chair or W/C: Min A (steadying Pt. > 75%);4: Chair or W/C > Bed: Min A (steadying Pt. > 75%)  FIM - Locomotion: Wheelchair Distance: 100' Locomotion: Wheelchair: 1: Total Assistance/staff pushes wheelchair (Pt<25%) FIM -  Locomotion: Ambulation Locomotion: Ambulation  Assistive Devices: Designer, industrial/product Ambulation/Gait Assistance: 4: Min guard Locomotion: Ambulation: 1: Travels less than 50 ft with minimal assistance (Pt.>75%)  Comprehension Comprehension Mode: Auditory Comprehension: 7-Follows complex conversation/direction: With no assist  Expression Expression Mode: Verbal Expression: 7-Expresses complex ideas: With no assist  Social Interaction Social Interaction: 7-Interacts appropriately with others - No medications needed.  Problem Solving Problem Solving: 7-Solves complex problems: Recognizes & self-corrects  Memory Memory: 7-Complete Independence: No helper Medical Problem List and Plan:  1. DVT Prophylaxis/Anticoagulation: Pharmaceutical: Coumadin  2. Pain Management: prn oxycodone is effective. Will continue. Encouraged use of ice post therapies. KI 3. Mood: Motivated and has good/realistic outlook. LCSW to follow for evaluation.  4. Neuropsych: This patient is capable of making decisions on his/her own behalf.  5. DM diet controlled: will monitor BS with AC/HS cbg checks and use SSI to controlelevated BS. Should Improve with increase in activity level.   -follow for pattern 6. Constipation: will schedule miralax daily.  7. Leukocytosis: resolved, afebrile 8. ABLA: Ferrous sulfate   LOS (Days) 2 A FACE TO FACE EVALUATION WAS PERFORMED  Claudette Laws E 07/29/2012 8:10 AM

## 2012-07-30 ENCOUNTER — Inpatient Hospital Stay (HOSPITAL_COMMUNITY): Payer: Managed Care, Other (non HMO)

## 2012-07-30 ENCOUNTER — Inpatient Hospital Stay (HOSPITAL_COMMUNITY): Payer: Managed Care, Other (non HMO) | Admitting: Occupational Therapy

## 2012-07-30 DIAGNOSIS — E119 Type 2 diabetes mellitus without complications: Secondary | ICD-10-CM

## 2012-07-30 DIAGNOSIS — S8290XD Unspecified fracture of unspecified lower leg, subsequent encounter for closed fracture with routine healing: Secondary | ICD-10-CM

## 2012-07-30 LAB — GLUCOSE, CAPILLARY: Glucose-Capillary: 181 mg/dL — ABNORMAL HIGH (ref 70–99)

## 2012-07-30 LAB — PROTIME-INR: INR: 2.65 — ABNORMAL HIGH (ref 0.00–1.49)

## 2012-07-30 MED ORDER — WARFARIN SODIUM 2 MG PO TABS
2.0000 mg | ORAL_TABLET | Freq: Once | ORAL | Status: AC
  Administered 2012-07-30: 2 mg via ORAL
  Filled 2012-07-30: qty 1

## 2012-07-30 NOTE — Progress Notes (Signed)
Billy Horn is a 67 y.o. male 1945-11-03 [id number]  Subjective: No new complaints. No new problems. Slept well. Feeling OK.  Objective: Vital signs in last 24 hours: Temp:  [97.9 F (36.6 C)-98.5 F (36.9 C)] 97.9 F (36.6 C) (04/12 0504) Pulse Rate:  [78-94] 78 (04/12 0504) Resp:  [18-20] 20 (04/12 0504) BP: (137-160)/(78-91) 156/78 mmHg (04/12 0504) SpO2:  [96 %-97 %] 97 % (04/12 0504) Weight change:  Last BM Date: 07/29/12  Intake/Output from previous day: 04/11 0701 - 04/12 0700 In: 240 [P.O.:240] Out: 200 [Urine:200] Last cbgs: CBG (last 3)   Recent Labs  07/29/12 1622 07/29/12 2117 07/30/12 0727  GLUCAP 160* 239* 148*     Physical Exam General: No apparent distress    HEENT: moist mucosa Lungs: Normal effort. Lungs clear to auscultation, no crackles or wheezes. Cardiovascular: Regular rate and rhythm, no edema Musculoskeletal:  No change from before Neurological: No new neurological deficits Wounds: N/A    Skin: clear Alert, cooperative   Lab Results: BMET    Component Value Date/Time   NA 131* 07/28/2012 0615   K 3.9 07/28/2012 0615   CL 97 07/28/2012 0615   CO2 28 07/28/2012 0615   GLUCOSE 271* 07/28/2012 0615   BUN 17 07/28/2012 0615   CREATININE 1.03 07/28/2012 0615   CALCIUM 8.3* 07/28/2012 0615   GFRNONAA 73* 07/28/2012 0615   GFRAA 85* 07/28/2012 0615   CBC    Component Value Date/Time   WBC 7.2 07/28/2012 0615   RBC 3.22* 07/28/2012 0615   HGB 9.4* 07/28/2012 0615   HCT 26.5* 07/28/2012 0615   PLT 220 07/28/2012 0615   MCV 82.3 07/28/2012 0615   MCH 29.2 07/28/2012 0615   MCHC 35.5 07/28/2012 0615   RDW 12.8 07/28/2012 0615   LYMPHSABS 1.1 07/28/2012 0615   MONOABS 0.8 07/28/2012 0615   EOSABS 0.1 07/28/2012 0615   BASOSABS 0.0 07/28/2012 0615    Studies/Results: No results found.  Medications: I have reviewed the patient's current medications.  Assessment/Plan:  1. DVT Prophylaxis/Anticoagulation: Pharmaceutical: Coumadin  2. Pain  Management: prn oxycodone is effective. Will continue. Encouraged use of ice post therapies. KI  3. Mood: Motivated and has good/realistic outlook. LCSW to follow for evaluation.  4. Neuropsych: This patient is capable of making decisions on his/her own behalf.  5. DM diet controlled: will monitor BS with AC/HS cbg checks and use SSI to controlelevated BS. Should Improve with increase in activity level. Will check tomorrow -follow for pattern  6. Constipation: will schedule miralax daily.  7. Leukocytosis: resolved, afebrile  8. ABLA: Ferrous sulfate     Length of stay, days: 3  Sonda Primes , MD 07/30/2012, 8:56 AM

## 2012-07-30 NOTE — Progress Notes (Addendum)
Occupational Therapy Session Note  Patient Details  Name: HIMMAT ENBERG MRN: [id number] Date of Birth: 02-Oct-1945  Today's Date: 07/30/2012 Time:  1478-2956 Time Calculation (min):45 min  2nd SESSION:Pt able to use demonstrate safe functional mobility to safely retrieve cup from cabinet, put into walker bat, retrieve water from pitcher in refrigerator and return cup to counter while maintaining L LE NWB.   As well, w/c level he was able to retrieve items from cabinet and refrigerator and place on lap tray and transport to ktichen table.  This is important practice so that patient may demonstrate safety with this skill before going home in which he will only have initial intermittent assist.  Therapy/Group: Individual Therapy  Bud Face [facility] 07/30/2012, 3:06 PM

## 2012-07-30 NOTE — Progress Notes (Signed)
Occupational Therapy Session Note  Patient Details  Name: Billy Horn MRN: [id number] Date of Birth: [birth year]  Today's Date: 07/30/2012 Time: 0900-0955 Time Calculation (min): 55 min   Skilled Therapeutic Interventions/Progress Updates:      Therapy Documentation Precautions:  Precautions Precautions: Fall Required Braces or Orthoses: Knee Immobilizer - Left Knee Immobilizer - Left:  (needs clarification) Restrictions Weight Bearing Restrictions: Yes LLE Weight Bearing: Non weight bearing   Pain:3/4 knee to upper portion of lower leg constant ache and greater with movement   ADL:    Bed to w/c transfer =Supervision stand pivot with bed elevated like his at home.  Seated in w/c at sink  With left leg elevated for bathring upper portion.  Preferred not to remove TED hose as he stated, "it is way to difficult to get back on;so, let's not take it off."  Patient intended to wash periarea after completing sitting for some time on toilet to attempt to have BM.    Patient maintained L LE NWB during this morning's OT session.  Immobilizer in place   See FIM for current functional status  Therapy/Group: Individual Therapy  Bud Face [facility] 07/30/2012, 10:29 AM

## 2012-07-30 NOTE — Progress Notes (Signed)
Physical Therapy Session Note  Patient Details  Name: Billy Horn MRN: [id number] Date of Birth: 1945-08-14  Today's Date: 07/30/2012  Short Term Goals: Week 1:  PT Short Term Goal 1 (Week 1): = LTGs due to short LOS   Session #1 Time: 0900-0955 Time Calculation (min): 55 min Individual therapy; Premedicated for pain. Focused session on stair negotiation in stairwell with steady A for seat to floor transfers and S for bumping up/down flight of stairs, dynamic gait through obstacle course with steady A intermittently with turns, curb step negotiation x 6 reps for home entry simulation with min A, and Nustep on level 5 for endurance and strengthening x 10 min (LLE elevated off to the side due to WB restrictions). W/c propulsion to/from therapy mod I on unit. Gait x 50' with close S in hallway for endurance training.  Session #2 Time: 4098-1191 Time Calculation: 52 min Individual therapy; Denies pain. Session focused on outdoor w/c mobility (S) and gait training on uneven surfaces outside (steady A with RW), education on community re-entry, energy conservation technique out in community, and navigating w/c on/off elevator. Transferred back to bed end of session to elevate LLE.   Therapy Documentation Precautions:  Precautions Precautions: Fall Required Braces or Orthoses: Knee Immobilizer - Left Knee Immobilizer - Left:  (needs clarification) Restrictions Weight Bearing Restrictions: Yes LLE Weight Bearing: Non weight bearing   See FIM for current functional status  Therapy/Group: Individual Therapy  Karolee Stamps [facility] 07/30/2012, 10:00 AM

## 2012-07-30 NOTE — Progress Notes (Signed)
ANTICOAGULATION CONSULT NOTE - Follow Up Consult  Pharmacy Consult for coumadin Indication: VTE prophylaxis  No Known Allergies  Patient Measurements:   Heparin Dosing Weight:   Vital Signs: Temp: 97.9 F (36.6 C) (04/12 0504) Temp src: Oral (04/12 0504) BP: 156/78 mmHg (04/12 0504) Pulse Rate: 78 (04/12 0504)  Labs:  Recent Labs  07/28/12 0615 07/28/12 1256 07/29/12 0602 07/30/12 0550  HGB 9.4*  --   --   --   HCT 26.5*  --   --   --   PLT 220  --   --   --   LABPROT  --  25.2* 26.3* 27.0*  INR  --  2.42* 2.56* 2.65*  CREATININE 1.03  --   --   --     The CrCl is unknown because both a height and weight (above a minimum accepted value) are required for this calculation.   Medications:  Scheduled:  . ferrous sulfate  325 mg Oral BID WC  . insulin aspart  0-5 Units Subcutaneous QHS  . insulin aspart  0-9 Units Subcutaneous TID WC  . polyethylene glycol  17 g Oral Daily  . [COMPLETED] warfarin  2.5 mg Oral ONCE-1800  . Warfarin - Pharmacist Dosing Inpatient   Does not apply q1800   Infusions:    Assessment: 67 yo male s/p ORIF is currently on therapeutic coumadin for VTE prophylaxis.  INR today is 2.65 Goal of Therapy:  INR 2-3    Plan:  1. Coumadin 2mg  po x1 2. Daily PT/INR   Billy Horn 07/30/2012,10:30 AM

## 2012-07-31 ENCOUNTER — Inpatient Hospital Stay (HOSPITAL_COMMUNITY): Payer: Managed Care, Other (non HMO) | Admitting: *Deleted

## 2012-07-31 DIAGNOSIS — I10 Essential (primary) hypertension: Secondary | ICD-10-CM

## 2012-07-31 LAB — GLUCOSE, CAPILLARY
Glucose-Capillary: 274 mg/dL — ABNORMAL HIGH (ref 70–99)
Glucose-Capillary: 203 mg/dL — ABNORMAL HIGH (ref 70–99)

## 2012-07-31 LAB — PROTIME-INR
INR: 2.55 — ABNORMAL HIGH (ref 0.00–1.49)
Prothrombin Time: 26.2 seconds — ABNORMAL HIGH (ref 11.6–15.2)

## 2012-07-31 MED ORDER — METFORMIN HCL 500 MG PO TABS
500.0000 mg | ORAL_TABLET | Freq: Every day | ORAL | Status: DC
  Administered 2012-08-01 – 2012-08-03 (×3): 500 mg via ORAL
  Filled 2012-07-31 (×4): qty 1

## 2012-07-31 MED ORDER — LOSARTAN POTASSIUM 50 MG PO TABS
50.0000 mg | ORAL_TABLET | Freq: Every day | ORAL | Status: DC
  Administered 2012-07-31 – 2012-08-03 (×4): 50 mg via ORAL
  Filled 2012-07-31 (×5): qty 1

## 2012-07-31 MED ORDER — WARFARIN SODIUM 2 MG PO TABS
2.0000 mg | ORAL_TABLET | Freq: Every day | ORAL | Status: DC
  Administered 2012-07-31 – 2012-08-02 (×3): 2 mg via ORAL
  Filled 2012-07-31 (×4): qty 1

## 2012-07-31 NOTE — Progress Notes (Signed)
ANTICOAGULATION CONSULT NOTE - Follow Up Consult  Pharmacy Consult for coumadin Indication: VTE prophylaxis  No Known Allergies  Patient Measurements:   Heparin Dosing Weight:   Vital Signs: Temp: 98.4 F (36.9 C) (04/13 0515) Temp src: Oral (04/13 0515) BP: 152/94 mmHg (04/13 0515) Pulse Rate: 94 (04/13 0515)  Labs:  Recent Labs  07/29/12 0602 07/30/12 0550 07/31/12 0604  LABPROT 26.3* 27.0* 26.2*  INR 2.56* 2.65* 2.55*    The CrCl is unknown because both a height and weight (above a minimum accepted value) are required for this calculation.   Medications:  Scheduled:  . ferrous sulfate  325 mg Oral BID WC  . insulin aspart  0-5 Units Subcutaneous QHS  . insulin aspart  0-9 Units Subcutaneous TID WC  . losartan  50 mg Oral Daily  . [START ON 08/01/2012] metFORMIN  500 mg Oral Q breakfast  . polyethylene glycol  17 g Oral Daily  . [COMPLETED] warfarin  2 mg Oral ONCE-1800  . Warfarin - Pharmacist Dosing Inpatient   Does not apply q1800   Infusions:    Assessment: 67 yo male s/p ORIF is currently on therapeutic coumadin for VTE prophylaxis.  INR today is 2.55 Goal of Therapy:  INR 2-3    Plan:  1. Coumadin 2mg  po daily 2. Daily PT/INR   Jadelin Eng P[place]et 07/31/2012,11:30 AM

## 2012-07-31 NOTE — Progress Notes (Signed)
Billy Horn is a 67 y.o. male 08-04-1945 [id number]  Subjective: No new complaints. No new problems. Slept well. Feeling OK.  Objective: Vital signs in last 24 hours: Temp:  [98.4 F (36.9 C)-98.6 F (37 C)] 98.4 F (36.9 C) (04/13 0515) Pulse Rate:  [82-94] 94 (04/13 0515) Resp:  [17-18] 17 (04/13 0515) BP: (149-172)/(82-94) 152/94 mmHg (04/13 0515) SpO2:  [98 %-99 %] 98 % (04/13 0515) Weight change:  Last BM Date: 07/30/12  Intake/Output from previous day: 04/12 0701 - 04/13 0700 In: 720 [P.O.:720] Out: 850 [Urine:850] Last cbgs: CBG (last 3)   Recent Labs  07/30/12 1649 07/30/12 2046 07/31/12 0733  GLUCAP 153* 154* 183*     Physical Exam General: No apparent distress    HEENT: moist mucosa Lungs: Normal effort. Lungs clear to auscultation, no crackles or wheezes. Cardiovascular: Regular rate and rhythm, no edema Musculoskeletal:  No change from before Neurological: No new neurological deficits Wounds: N/A    Skin: clear Alert, cooperative   Lab Results: BMET    Component Value Date/Time   NA 131* 07/28/2012 0615   K 3.9 07/28/2012 0615   CL 97 07/28/2012 0615   CO2 28 07/28/2012 0615   GLUCOSE 271* 07/28/2012 0615   BUN 17 07/28/2012 0615   CREATININE 1.03 07/28/2012 0615   CALCIUM 8.3* 07/28/2012 0615   GFRNONAA 73* 07/28/2012 0615   GFRAA 85* 07/28/2012 0615   CBC    Component Value Date/Time   WBC 7.2 07/28/2012 0615   RBC 3.22* 07/28/2012 0615   HGB 9.4* 07/28/2012 0615   HCT 26.5* 07/28/2012 0615   PLT 220 07/28/2012 0615   MCV 82.3 07/28/2012 0615   MCH 29.2 07/28/2012 0615   MCHC 35.5 07/28/2012 0615   RDW 12.8 07/28/2012 0615   LYMPHSABS 1.1 07/28/2012 0615   MONOABS 0.8 07/28/2012 0615   EOSABS 0.1 07/28/2012 0615   BASOSABS 0.0 07/28/2012 0615    Studies/Results: No results found.  Medications: I have reviewed the patient's current medications.  Assessment/Plan:  1. DVT Prophylaxis/Anticoagulation: Pharmaceutical: Coumadin  2. Pain  Management: prn oxycodone is effective. Will continue. Encouraged use of ice post therapies. KI  3. Mood: Motivated and has good/realistic outlook. LCSW to follow for evaluation.  4. Neuropsych: This patient is capable of making decisions on his/her own behalf.  5. DM diet controlled: will monitor BS with AC/HS cbg checks and use SSI to controlelevated BS. Should Improve with increase in activity level. Will check tomorrow -follow for pattern  6. Constipation: will schedule miralax daily.  7. Leukocytosis: resolved, afebrile  8. ABLA: Ferrous sulfate 9. HTN. Start Losartan     Length of stay, days: 4  Sonda Primes , MD 07/31/2012, 9:13 AM

## 2012-07-31 NOTE — Progress Notes (Signed)
Occupational Therapy Note  Patient Details  Name: Billy Horn MRN: [id number] Date of Birth: 03-Feb-1946 Today's Date: 07/31/2012  Time:  1500-1600  (60 min) Pain:  3/10 Individual session  Engaged in wc mobility, sit to stand, standing balance, activity tolerance, strengthening, walker safety.  Pt. Propelled wc from room to ADL apartment.  Engaged in household duties with education on utilizing energy conversation strategies while in static standing position.  Pt. Verbalized understanding and plans to keep things simple when he gets home.  He will have friends and neighbors assist him if needed after discharge.  Pt. Was supervision with house duty of cleaning and putting up dishes.  Utilized stool to sit when fatigued.      Billy Horn 07/31/2012, 4:36 PM

## 2012-08-01 ENCOUNTER — Inpatient Hospital Stay (HOSPITAL_COMMUNITY): Payer: Managed Care, Other (non HMO) | Admitting: Occupational Therapy

## 2012-08-01 ENCOUNTER — Inpatient Hospital Stay (HOSPITAL_COMMUNITY): Payer: Managed Care, Other (non HMO) | Admitting: *Deleted

## 2012-08-01 ENCOUNTER — Inpatient Hospital Stay (HOSPITAL_COMMUNITY): Payer: Managed Care, Other (non HMO) | Admitting: Physical Therapy

## 2012-08-01 LAB — GLUCOSE, CAPILLARY
Glucose-Capillary: 182 mg/dL — ABNORMAL HIGH (ref 70–99)
Glucose-Capillary: 203 mg/dL — ABNORMAL HIGH (ref 70–99)

## 2012-08-01 LAB — PROTIME-INR: Prothrombin Time: 25.3 seconds — ABNORMAL HIGH (ref 11.6–15.2)

## 2012-08-01 NOTE — Progress Notes (Signed)
ANTICOAGULATION CONSULT NOTE - Follow Up Consult  Pharmacy Consult for coumadin Indication: VTE prophylaxis  No Known Allergies  Patient Measurements:   Heparin Dosing Weight:   Vital Signs: Temp: 98.1 F (36.7 C) (04/14 0500) Temp src: Oral (04/14 0500) BP: 122/72 mmHg (04/14 0500) Pulse Rate: 69 (04/14 0500)  Labs:  Recent Labs  07/30/12 0550 07/31/12 0604 08/01/12 0606  LABPROT 27.0* 26.2* 25.3*  INR 2.65* 2.55* 2.43*    The CrCl is unknown because both a height and weight (above a minimum accepted value) are required for this calculation.   Medications:  Scheduled:  . ferrous sulfate  325 mg Oral BID WC  . insulin aspart  0-5 Units Subcutaneous QHS  . insulin aspart  0-9 Units Subcutaneous TID WC  . losartan  50 mg Oral Daily  . metFORMIN  500 mg Oral Q breakfast  . polyethylene glycol  17 g Oral Daily  . warfarin  2 mg Oral q1800  . Warfarin - Pharmacist Dosing Inpatient   Does not apply q1800   Infusions:    Assessment: 67 yo male s/p ORIF is currently on therapeutic coumadin for VTE prophylaxis.  INR today is 2.43 Goal of Therapy:  INR 2-3    Plan:  1) Continue coumadin 2mg  po daily 2) Change INR to MWF  Nitisha Civello, Tsz-Yin 08/01/2012,8:16 AM

## 2012-08-01 NOTE — Progress Notes (Signed)
Physical Therapy Session Note  Patient Details  Name: Billy Horn MRN: [id number] Date of Birth: 1945-07-25  Today's Date: 08/01/2012 Time: 0930-1030 Time Calculation (min): 60 min  Short Term Goals: Week 1:  PT Short Term Goal 1 (Week 1): = LTGs due to short LOS  Skilled Therapeutic Interventions/Progress Updates:    Patient received supine in bed. Lengthy discussion at beginning of session about discharge plan, follow up appointment with patient's surgeon, etc. Social worker present to clarify that patient will discharge on Wednesday, 08/03/12. Instruction on donning/doffing of brace and strategies to don/doff without ability to perform SLR on L. This session focused on gait training (in controlled and home environments; on tile and carpet surfaces), stair negotiation, and wheelchair mobility (see details below).  Discussion about activity tolerance at home. Patient returned to room and left supine in bed with all needs within reach.  Therapy Documentation Precautions:  Precautions Precautions: Fall Required Braces or Orthoses: Knee Immobilizer - Left Knee Immobilizer - Left:  (needs clarification) Restrictions Weight Bearing Restrictions: Yes LLE Weight Bearing: Non weight bearing Pain: Pain Assessment Pain Assessment: No/denies pain Pain Score: 0-No pain Locomotion : Ambulation Ambulation: Yes Ambulation/Gait Assistance: 5: Supervision Ambulation Distance (Feet): 90 Feet x1, 30' on carpet (to simulate surfaces at home) Assistive device: Rolling walker Gait Gait Pattern: Step-to pattern Stairs / Additional Locomotion Stairs: Yes Stairs Assistance: 5: Supervision Stairs Assistance Details: Verbal cues for technique Stair Management Technique: Seated/boosting;One rail Right Number of Stairs: 12 Height of Stairs: 7 Wheelchair Mobility Wheelchair Mobility: Yes Wheelchair Assistance: 5: Investment banker, operational: Both upper extremities Wheelchair Parts  Management: Independent Distance: 150  See FIM for current functional status  Therapy/Group: Individual Therapy  Chipper Herb. Arnett Duddy, PT, DPT  08/01/2012, 12:19 PM

## 2012-08-01 NOTE — Progress Notes (Signed)
Occupational Therapy Session Notes  Patient Details  Name: Billy Horn MRN: [id number] Date of Birth: November 03, 1945  Today's Date: 08/01/2012 Time: 0800-0900 and 1335-1420 Time Calculation (min): 60 min and 45 min  Short Term Goals: Week 1:  OT Short Term Goal 1 (Week 1): Pt will be Mod I toilet transfers, hygeine & clothing management OT Short Term Goal 1 - Progress (Week 1): Other (comment) (Set today) OT Short Term Goal 2 (Week 1): Pt will be Mod I UB/LB dressing/bathing w/ sit-stand from seated position w/ A/E PRN OT Short Term Goal 2 - Progress (Week 1): Other (comment) (Set today) OT Short Term Goal 3 (Week 1): Pt will be Mod I-supervision tub transfer into tub/shower combo using tub bench OT Short Term Goal 3 - Progress (Week 1): Other (comment) (Set today) OT Short Term Goal 4 (Week 1): Pt will be Mod I simple meal prep & homemaking tasks while ambulating &/or from w/c level OT Short Term Goal 4 - Progress (Week 1): Other (comment) (set today) OT Short Term Goal 5 (Week 1): Pt will demonstrate Independence w/ NWB LLE during functional mobility, transfers and ADL/IADL's  OT Short Term Goal 5 - Progress (Week 1): Other (comment) (Set today)  Skilled Therapeutic Interventions/Progress Updates:  1) Self care retraining to include sponge bath (declined shower), dress, walker safety during functional mobility.  Patient ambulated with supervision with focus on walker safety and LLE NWB to gather his clothes for the day, performed sponge bath seated and squat on one leg to wash bottom.  Patient reports that he will purchase the AE kit from downstairs to improve his independence with LB bath and dressing.  Patient able to doff left sock with reacher.  Following session, patient requested back to bed to rest and elevate LLE to assist with edema management.  2)  Patient in bed with LLE elevated upon arrival with friend BJ at his bedside.  Patient demonstrated ability to transition from supine to  sit EOB with use of AE and KI on LLE.  Adjusted hospital bed to resemble bed height at home and patient return demonstrated ability to transfer on/off of bed at this height.  Patient supervision fading to Mod I to don left sock with sock aid and doff with reacher.  Patient's friend plans to go to gift shop to purchase AE for LB bath and dress as well as a leg lifter to use when getting into and or out of bed, car, tub bench, etc.  Patient performed simulated (dry run) of w/c><tub bench to show BJ the plan for discharge DME recommendation to include 3 in 1 commode.    Therapy Documentation Precautions:  Precautions Precautions: Fall Required Braces or Orthoses: Knee Immobilizer - Left Knee Immobilizer - Left:  (needs clarification) Restrictions Weight Bearing Restrictions: Yes LLE Weight Bearing: Non weight bearing Pain: Pain Assessment Pain Assessment: 0-10 Pain Score:   5 Pain Type: Acute pain Pain Location: Leg Pain Orientation: Left Pain Descriptors: Aching Pain Frequency: Intermittent Pain Onset: Gradual Patients Stated Pain Goal: 2 Pain Intervention(s): Medication (See eMAR) ADL: See FIM for current functional status  Therapy/Group: Individual Therapy  Billy Horn 08/01/2012, 8:21 AM

## 2012-08-01 NOTE — Progress Notes (Signed)
Social Work Patient ID: Billy Horn, male   DOB: 02-05-1946, 67 y.o.   MRN: [id number]  Have heard from therapists that pt likely ready for d/c on Wednesday - have alerted MD as well.  Pt aware and agreeable.  Janis Sol, LCSW

## 2012-08-01 NOTE — Progress Notes (Signed)
Physical Therapy Session Note  Patient Details  Name: Billy Horn MRN: [id number] Date of Birth: 1945/12/16  Today's Date: 08/01/2012 Time: 1400-1430 Time Calculation (min): 30 min  Short Term Goals: Week 1:  PT Short Term Goal 1 (Week 1): = LTGs due to short LOS  Skilled Therapeutic Interventions/Progress Updates:   Patient reporting no pain medication this pm prior to therapy.  Patient requesting pain meds at beginning of session; RN made aware.  Patient able to demonstrate independent and safe w/c mobility x 150' x 2 in controlled environment and in smaller spaces and able to independently position w/c, perform all w/c parts management and transfer w/c <> mat squat pivot mod I with good adherence to WB restrictions.  Patient also able to demonstrate supine <> sit on low mat mod I.  Patient performed isometric glute and quad sets for progressive stretching of L hip flexor and hamstring for increased extension ROM beginning with 2 pillows under LE >> one pillow >> no pillow >> pillow under heel x 10 reps of 6 second isometric hold each.  Patient also performed AAROM strengthening during 10 reps each L ankle pumps, L SLR with assistance for concentric, isometric and eccentric control, and hip ABD.  Also discussed with patient importance of alternating in bed having LLE completely elevated for edema reduction but also completely flat in KI to allow hip flexor and hamstring to stretch out and prevent contractures.  Patient verbalized understanding.   Therapy Documentation Precautions:  Precautions Precautions: Fall Required Braces or Orthoses: Knee Immobilizer - Left Knee Immobilizer - Left:  (needs clarification) Restrictions Weight Bearing Restrictions: Yes LLE Weight Bearing: Non weight bearing Pain: Pain Assessment Pain Assessment: 0-10 Pain Score:   4 Pain Type: Acute pain Pain Location: Leg Pain Orientation: Left Pain Descriptors: Sore;Tightness Pain Frequency: Intermittent Pain  Onset: Gradual Patients Stated Pain Goal: 2 Pain Intervention(s): RN made aware  See FIM for current functional status  Therapy/Group: Individual Therapy  Edman Circle [facility] 08/01/2012, 5:17 PM

## 2012-08-01 NOTE — Progress Notes (Signed)
Patient ID: Billy Horn, male   DOB: 1945/11/11, 67 y.o.   MRN: [id number] Subjective/Complaints: No complaints. Working on steps with PT, lives on 2nd floor, Min guard to Sup levels A 12 point review of systems has been performed and if not noted above is otherwise negative.   Objective: Vital Signs: Blood pressure 122/72, pulse 69, temperature 98.1 F (36.7 C), temperature source Oral, resp. rate 19, SpO2 98.00%. No results found. No results found for this basename: WBC, HGB, HCT, PLT,  in the last 72 hours No results found for this basename: NA, K, CL, CO, GLUCOSE, BUN, CREATININE, CALCIUM,  in the last 72 hours CBG (last 3)   Recent Labs  07/31/12 1648 07/31/12 2107 08/01/12 0714  GLUCAP 203* 233* 182*    Wt Readings from Last 3 Encounters:  07/24/12 83.915 kg (185 lb)  07/24/12 83.915 kg (185 lb)  04/06/11 90.7 kg (199 lb 15.3 oz)    Physical Exam:  Constitutional: He is oriented to person, place, and time. He appears well-developed and well-nourished.  HENT:  Head: Normocephalic and atraumatic.  Dry scabbed abrasion left cheek.  Eyes: Pupils are equal, round, and reactive to light.  Cardiovascular: Normal rate and regular rhythm.  Pulmonary/Chest: Effort normal and breath sounds normal. No respiratory distress. He has no wheezes.  Abdominal: Soft. Bowel sounds are normal. He exhibits no distension. There is no tenderness.  Musculoskeletal:  Left knee with mild effusion.. Area is appropriately tender.  1+ pedal edema on left. L-biceps area with resolving ecchymosis.  Neurological: He is alert and oriented to person, place, and time. Strength functional with pain inhibition at the left leg/knee.  Skin: Skin is warm and dry.    Assessment/Plan: 1. Functional deficits secondary to left tibial plateau fx which require 3+ hours per day of interdisciplinary therapy in a comprehensive inpatient rehab setting. Physiatrist is providing close team supervision and 24 hour  management of active medical problems listed below. Physiatrist and rehab team continue to assess barriers to discharge/monitor patient progress toward functional and medical goals. Pt reports having an appt with Dr Montez Morita tomorrow FIM: FIM - Bathing Bathing Steps Patient Completed: Chest;Right Arm;Left Arm;Abdomen;Front perineal area;Buttocks;Right upper leg;Left upper leg (elected nottowash feet & will clean botom after toileting) Bathing: 5: Set-up assist to: Obtain items  FIM - Upper Body Dressing/Undressing Upper body dressing/undressing steps patient completed: Thread/unthread right sleeve of pullover shirt/dresss;Thread/unthread left sleeve of pullover shirt/dress;Put head through opening of pull over shirt/dress;Pull shirt over trunk Upper body dressing/undressing: 5: Set-up assist to: Obtain clothing/put away FIM - Lower Body Dressing/Undressing Lower body dressing/undressing steps patient completed: Thread/unthread right pants leg;Thread/unthread left pants leg;Pull pants up/down Lower body dressing/undressing: 0: Activity did not occur  FIM - Toileting Toileting steps completed by patient: Adjust clothing prior to toileting;Performs perineal hygiene;Adjust clothing after toileting Toileting Assistive Devices: Grab bar or rail for support Toileting: 0: Activity did not occur  FIM - Diplomatic Services operational officer Devices: Grab bars Toilet Transfers: 5-To toilet/BSC: Supervision (verbal cues/safety issues);5-From toilet/BSC: Supervision (verbal cues/safety issues)  FIM - Banker Devices: Walker;Arm rests Bed/Chair Transfer: 5: Bed > Chair or W/C: Supervision (verbal cues/safety issues);5: Chair or W/C > Bed: Supervision (verbal cues/safety issues)  FIM - Locomotion: Wheelchair Distance: 100' Locomotion: Wheelchair: 5: Travels 150 ft or more: maneuvers on rugs and over door sills with supervision, cueing or coaxing FIM -  Locomotion: Ambulation Locomotion: Ambulation Assistive Devices: Designer, industrial/product Ambulation/Gait Assistance: 4: Min guard  Locomotion: Ambulation: 2: Travels 50 - 149 ft with minimal assistance (Pt.>75%)  Comprehension Comprehension Mode: Auditory Comprehension: 7-Follows complex conversation/direction: With no assist  Expression Expression Mode: Verbal Expression: 7-Expresses complex ideas: With no assist  Social Interaction Social Interaction: 7-Interacts appropriately with others - No medications needed.  Problem Solving Problem Solving: 7-Solves complex problems: Recognizes & self-corrects  Memory Memory: 7-Complete Independence: No helper Medical Problem List and Plan:  1. DVT Prophylaxis/Anticoagulation: Pharmaceutical: Coumadin  2. Pain Management: prn oxycodone is effective. Will continue. Encouraged use of ice post therapies. KI 3. Mood: Motivated and has good/realistic outlook. LCSW to follow for evaluation.  4. Neuropsych: This patient is capable of making decisions on his/her own behalf.  5. DM diet controlled: will monitor BS with AC/HS cbg checks and use SSI to control elevated BS. Should Improve with increase in activity level.   -follow for pattern 6. Constipation: will schedule miralax daily.  7. Leukocytosis: resolved, afebrile 8. ABLA: Ferrous sulfate 9.  Wound, looks like staples can stay in longer, contacted ortho to discuss timing of f/u appt and xrays  LOS (Days) 5 A FACE TO FACE EVALUATION WAS PERFORMED  Shaleen Talamantez E 08/01/2012 8:03 AM

## 2012-08-01 NOTE — Progress Notes (Signed)
Inpatient Diabetes Program Recommendations  AACE/ADA: New Consensus Statement on Inpatient Glycemic Control (2013)  Target Ranges:  Prepandial:   less than 140 mg/dL      Peak postprandial:   less than 180 mg/dL (1-2 hours)      Critically ill patients:  140 - 180 mg/dL   Reason for Assessment:  Hyperglycemia  Results for HOSEY, BURMESTER (MRN [id number]) as of 08/01/2012 10:29  Ref. Range 07/31/2012 07:33 07/31/2012 11:45 07/31/2012 16:48 07/31/2012 21:07 08/01/2012 07:14  Glucose-Capillary Latest Range: 70-99 mg/dL 409 (H) 811 (H) 914 (H) 233 (H) 182 (H)    Inpatient Diabetes Program Recommendations Insulin - Meal Coverage: Consider meal coverage insulin - Novolog 3 units tidwc  Note: Restarted metformin today.  Will follow.  Thank you. Ailene Ards, RD, LDN, CDE Inpatient Diabetes Coordinator [phone]

## 2012-08-02 ENCOUNTER — Inpatient Hospital Stay (HOSPITAL_COMMUNITY): Payer: Managed Care, Other (non HMO)

## 2012-08-02 ENCOUNTER — Inpatient Hospital Stay (HOSPITAL_COMMUNITY): Payer: Managed Care, Other (non HMO) | Admitting: Occupational Therapy

## 2012-08-02 DIAGNOSIS — W19XXXA Unspecified fall, initial encounter: Secondary | ICD-10-CM

## 2012-08-02 DIAGNOSIS — D62 Acute posthemorrhagic anemia: Secondary | ICD-10-CM

## 2012-08-02 DIAGNOSIS — S82109A Unspecified fracture of upper end of unspecified tibia, initial encounter for closed fracture: Secondary | ICD-10-CM

## 2012-08-02 LAB — GLUCOSE, CAPILLARY
Glucose-Capillary: 168 mg/dL — ABNORMAL HIGH (ref 70–99)
Glucose-Capillary: 129 mg/dL — ABNORMAL HIGH (ref 70–99)

## 2012-08-02 MED ORDER — METFORMIN HCL 500 MG PO TABS: 500.0000 mg | ORAL_TABLET | Freq: Every day | ORAL | Status: DC

## 2012-08-02 MED ORDER — LOSARTAN POTASSIUM 50 MG PO TABS: 50.0000 mg | ORAL_TABLET | Freq: Every day | ORAL | Status: DC

## 2012-08-02 MED ORDER — WARFARIN SODIUM 2 MG PO TABS: 2.0000 mg | ORAL_TABLET | Freq: Once | ORAL | Status: DC

## 2012-08-02 MED ORDER — ACETAMINOPHEN 325 MG PO TABS: 325.0000 mg | ORAL_TABLET | ORAL | Status: AC | PRN

## 2012-08-02 MED ORDER — FERROUS SULFATE 325 (65 FE) MG PO TABS: 325.0000 mg | ORAL_TABLET | Freq: Two times a day (BID) | ORAL | Status: DC

## 2012-08-02 MED ORDER — OXYCODONE HCL 5 MG PO TABS: 5.0000 mg | ORAL_TABLET | Freq: Four times a day (QID) | ORAL | Status: DC | PRN

## 2012-08-02 MED ORDER — POLYETHYLENE GLYCOL 3350 17 G PO PACK: 17.0000 g | PACK | Freq: Every day | ORAL | Status: DC

## 2012-08-02 MED ORDER — METHOCARBAMOL 500 MG PO TABS: 500.0000 mg | ORAL_TABLET | Freq: Four times a day (QID) | ORAL | Status: DC | PRN

## 2012-08-02 NOTE — Progress Notes (Signed)
Occupational Therapy Discharge Summary  Patient Details  Name: Billy Horn MRN: [id number] Date of Birth: [birth year]  Today's Date: 08/02/2012 Time: 0803-0900 Time Calculation (min): 57 min  Patient has met 10 of 10 long term goals due to improved activity tolerance, improved balance, ability to compensate for deficits and improved strength.  Patient to discharge at overall Modified Independent supervision level.  Patient is independent guiding a caregiver if assistance is needed after discharge.  Patient's friend "BJ" present to observe patient's current function for some BADL tasks.    Reasons goals not met: n/a secondary to all goals met  Recommendation:  Patient will benefit from ongoing skilled OT services in home health setting to continue to advance functional skills in the area of BADL, iADL and Reduce care partner burden.  Equipment: 3 in 1 commode and tub bench  Reasons for discharge: treatment goals met and discharge from hospital  Patient/family agrees with progress made and goals achieved: Yes  OT Discharge Precautions/Restrictions  Precautions Precautions: Fall Required Braces or Orthoses: Knee Immobilizer - Left Restrictions Weight Bearing Restrictions: Yes LLE Weight Bearing: Non weight bearing Pain 3/10 left LE Vision/Perception  Vision - History Baseline Vision: Wears glasses all the time Patient Visual Report: No change from baseline  Cognition Overall Cognitive Status: Appears within functional limits for tasks assessed Arousal/Alertness: Awake/alert Orientation Level: Oriented X4 Sensation Sensation Light Touch: Appears Intact (BUEs) Coordination Gross Motor Movements are Fluid and Coordinated: Yes Fine Motor Movements are Fluid and Coordinated: Yes Motor  Motor Motor: Within Functional Limits Locomotion  Ambulation  Ambulation/Gait Assistance: 6: Modified independent (Device/Increase time)  Trunk/Postural Assessment  Cervical Assessment   Cervical Assessment: Within Functional Limits  Thoracic Assessment  Thoracic Assessment: Within Functional Limits  Lumbar Assessment  Lumbar Assessment: Within Functional Limits  Balance  Static Sitting Balance  Static Sitting - Level of Assistance: 7: Independent  Dynamic Sitting Balance  Dynamic Sitting - Level of Assistance: 6: Modified independent (Device/Increase time)  Static Standing Balance  Static Standing - Level of Assistance: 6: Modified independent (Device/Increase time)  Dynamic Standing Balance  Dynamic Standing - Level of Assistance: 6: Modified independent (Device/Increase time) Extremity/Trunk Assessment RUE Assessment RUE Assessment: Within Functional Limits LUE Assessment LUE Assessment: Within Functional Limits  See FIM for current functional status  [name] 08/02/2012, 8:27 AM

## 2012-08-02 NOTE — Progress Notes (Signed)
Physical Therapy Discharge Summary  Patient Details  Name: Billy Horn MRN: [id number] Date of Birth: 12-20-1945  Today's Date: 08/02/2012  Session #1 Time: 0902-0930 Time Calculation (min): 28 min Individual therapy; Premedicated for pain medication. Finished self dressing including donning sock on LLE with sock aid and donning KI on LLE w/c level mod I. Gait on unit with RW mod I household distances; good safety noted and able to verbalize when needing to take rest break. Simulated pt's bed height at home (tall) for bed mobility and transfers all mod I (did not use leg lifter this time). Pt made mod I in room.  Session #2 Time: 1000-1100 (60 min) Individual therapy; no complaints of pain. Simulated stairs for home entry into second floor apartment up/down flight of stairs scooting on bottom and floor transfer on stairs with S. Practiced curb step backwards with RW with close S; has 6 steps at home that pt may do or have assist with pushing w/c around the steps area. Simulated car transfer mod I for transfer and set up assist will be needed for putting w/c and RW into the car. W/c mobility on unit mod I in controlled and household environment. Dynamic gait through obstacle course navigating obstacles and stepping over simulated threshold as well as collecting horseshoes placed at various heights for balance mod I. Nustep for general endurance and strengthening (modified so LLE resting off to the side elevated) x 10 min on level 5 with 1 rest break.    Patient has met 10 of 10 long term goals due to improved activity tolerance, improved balance, increased strength and ability to compensate for deficits.  Patient to discharge at a wheelchair level Modified Independent and household mod I for short distances with RW.  Pt will have intermittent assist from friends and girlfriend. Pt is able to direct care as needed.   Reasons goals not met: n/a - all goals met.  Recommendation:  Patient will  benefit from ongoing skilled PT services in home health setting to continue to advance safe functional mobility, address ongoing impairments in gait, balance, ROM/strengthening of LLE, progressing WB status as medically indicated, endurance, and minimize fall risk.  Equipment: 18x18 w/c with elevating legrests, RW  Reasons for discharge: treatment goals met and discharge from hospital  Patient/family agrees with progress made and goals achieved: Yes  PT Discharge Precautions/Restrictions Precautions Precautions: Fall Required Braces or Orthoses: Knee Immobilizer - Left Knee Immobilizer - Left: On at all times Restrictions Weight Bearing Restrictions: Yes LLE Weight Bearing: Non weight bearing  Vision/Perception  Vision - History Baseline Vision: Wears glasses all the time Patient Visual Report: No change from baseline  Cognition Overall Cognitive Status: Appears within functional limits for tasks assessed Arousal/Alertness: Awake/alert Orientation Level: Oriented X4 Sensation Sensation Light Touch: Appears Intact Coordination Gross Motor Movements are Fluid and Coordinated: Yes Fine Motor Movements are Fluid and Coordinated: Yes Motor  Motor Motor: Within Functional Limits  Locomotion  Ambulation Ambulation/Gait Assistance: 6: Modified independent (Device/Increase time)  Trunk/Postural Assessment  Cervical Assessment Cervical Assessment: Within Functional Limits Thoracic Assessment Thoracic Assessment: Within Functional Limits Lumbar Assessment Lumbar Assessment: Within Functional Limits  Balance Static Sitting Balance Static Sitting - Level of Assistance: 7: Independent Dynamic Sitting Balance Dynamic Sitting - Level of Assistance: 6: Modified independent (Device/Increase time) Static Standing Balance Static Standing - Level of Assistance: 6: Modified independent (Device/Increase time) Dynamic Standing Balance Dynamic Standing - Level of Assistance: 6: Modified  independent (Device/Increase time) Extremity Assessment  RUE  Assessment RUE Assessment: Within Functional Limits LUE Assessment LUE Assessment: Within Functional Limits RLE Assessment RLE Assessment: Within Functional Limits LLE Assessment LLE Assessment: Exceptions to WFL LLE AROM (degrees) LLE Overall AROM Comments: Decreased knee extension while positioned in knee immobilizer; no ROM at this time  See FIM for current functional status  Karolee Stamps [facility] 08/02/2012, 12:18 PM

## 2012-08-02 NOTE — Progress Notes (Signed)
Patient ID: Billy Horn, male   DOB: [birth year], 67 y.o.   MRN: [id number] Subjective/Complaints: No complaints. Working on steps with PT, lives on 2nd floor, Min guard to Sup levels A 12 point review of systems has been performed and if not noted above is otherwise negative.   Objective: Vital Signs: Blood pressure 144/70, pulse 81, temperature 98.3 F (36.8 C), temperature source Oral, resp. rate 18, SpO2 98.00%. No results found. No results found for this basename: WBC, HGB, HCT, PLT,  in the last 72 hours No results found for this basename: NA, K, CL, CO, GLUCOSE, BUN, CREATININE, CALCIUM,  in the last 72 hours CBG (last 3)   Recent Labs  08/01/12 1656 08/01/12 2114 08/02/12 0713  GLUCAP 110* 191* 168*    Wt Readings from Last 3 Encounters:  07/24/12 83.915 kg (185 lb)  07/24/12 83.915 kg (185 lb)  04/06/11 90.7 kg (199 lb 15.3 oz)    Physical Exam:  Constitutional: He is oriented to person, place, and time. He appears well-developed and well-nourished.  HENT:  Head: Normocephalic and atraumatic.  Dry scabbed abrasion left cheek.  Eyes: Pupils are equal, round, and reactive to light.  Cardiovascular: Normal rate and regular rhythm.  Pulmonary/Chest: Effort normal and breath sounds normal. No respiratory distress. He has no wheezes.  Abdominal: Soft. Bowel sounds are normal. He exhibits no distension. There is no tenderness.  Musculoskeletal:  Left knee with mild effusion.. Area is appropriately tender.  1+ pedal edema on left. L-biceps area with resolving ecchymosis.  Neurological: He is alert and oriented to person, place, and time. Strength functional with pain inhibition at the left leg/knee.  Skin: Skin is warm and dry, incision CDI.    Assessment/Plan: 1. Functional deficits secondary to left tibial plateau fx which require 3+ hours per day of interdisciplinary therapy in a comprehensive inpatient rehab setting. Team conf today Medically ready for D/C in  am FIM: FIM - Bathing Bathing Steps Patient Completed: Chest;Right Arm;Left Arm;Abdomen;Front perineal area;Buttocks;Right upper leg;Left upper leg;Left lower leg (including foot) Bathing: 4: Min-Patient completes 8-9 41f 10 parts or 75+ percent (reports that he plans to get LH sponge)  FIM - Upper Body Dressing/Undressing Upper body dressing/undressing steps patient completed: Thread/unthread right sleeve of pullover shirt/dresss;Thread/unthread left sleeve of pullover shirt/dress;Put head through opening of pull over shirt/dress;Pull shirt over trunk Upper body dressing/undressing: 5: Set-up assist to: Obtain clothing/put away FIM - Lower Body Dressing/Undressing Lower body dressing/undressing steps patient completed: Don/Doff right sock;Thread/unthread right underwear leg;Thread/unthread left underwear leg;Pull underwear up/down (declined pants & shoes, using grip socks) Lower body dressing/undressing: 4: Min-Patient completed 75 plus % of tasks  FIM - Toileting Toileting steps completed by patient: Adjust clothing prior to toileting;Performs perineal hygiene;Adjust clothing after toileting Toileting Assistive Devices: Grab bar or rail for support Toileting: 0: Activity did not occur  FIM - Diplomatic Services operational officer Devices: Grab bars Toilet Transfers: 5-To toilet/BSC: Supervision (verbal cues/safety issues);5-From toilet/BSC: Supervision (verbal cues/safety issues)  FIM - Banker Devices: Walker;Arm rests;Bed rails Bed/Chair Transfer: 6: Sit > Supine: No assist;5: Bed > Chair or W/C: Supervision (verbal cues/safety issues);5: Chair or W/C > Bed: Supervision (verbal cues/safety issues);6: Supine > Sit: No assist  FIM - Locomotion: Wheelchair Distance: 150 Locomotion: Wheelchair: 5: Travels 150 ft or more: maneuvers on rugs and over door sills with supervision, cueing or coaxing FIM - Locomotion: Ambulation Locomotion:  Ambulation Assistive Devices: Designer, industrial/product Ambulation/Gait Assistance: 5: Supervision Locomotion: Ambulation:  2: Travels 50 - 149 ft with supervision/safety issues  Comprehension Comprehension Mode: Auditory Comprehension: 7-Follows complex conversation/direction: With no assist  Expression Expression Mode: Verbal Expression: 7-Expresses complex ideas: With no assist  Social Interaction Social Interaction: 7-Interacts appropriately with others - No medications needed.  Problem Solving Problem Solving: 7-Solves complex problems: Recognizes & self-corrects  Memory Memory: 7-Complete Independence: No helper Medical Problem List and Plan:  1. DVT Prophylaxis/Anticoagulation: Pharmaceutical: Coumadin  2. Pain Management: prn oxycodone is effective. Will continue. Encouraged use of ice post therapies. KI 3. Mood: Motivated and has good/realistic outlook. LCSW to follow for evaluation.  4. Neuropsych: This patient is capable of making decisions on his/her own behalf.  5. DM diet controlled: will monitor BS with AC/HS cbg checks and use SSI to control elevated BS. Should Improve with increase in activity level.   -follow for pattern 6. Constipation: will schedule miralax daily.  7. Leukocytosis: resolved, afebrile 8. ABLA: Ferrous sulfate 9.  Wound, looks like staples can stay in longer, f/u with ortho as outpt   LOS (Days) 6 A FACE TO FACE EVALUATION WAS PERFORMED  Erick Colace 08/02/2012 7:37 AM

## 2012-08-02 NOTE — Progress Notes (Signed)
Physical Therapy Note  Patient Details  Name: Billy Horn MRN: [id number] Date of Birth: 19-Jan-1946 Today's Date: 08/02/2012  2:15 - 2:55 40 minutes Individual session Patient reports left leg pain as 4.  Treatment session focused on LE strengthening exercises x 12 reps each - active on right and active assistive on left. Exercises included LAQ's and knee flexion with resistive theraband in sitting on R; SLR, hip abduction, quad sets, glut sets, and ankle pumps bilaterally in supine.  Patient transferred bed to wheelchair and wheelchair to and from mat independently using rolling walker. Patient left in wheelchair in room - pt modified independent in room today.     Arelia Longest M 08/02/2012, 3:51 PM

## 2012-08-02 NOTE — Discharge Summary (Signed)
Physician Discharge Summary  Patient ID: Billy Horn MRN: [id number] DOB/AGE: 25-Oct-1945 67 y.o.  Admit date: 07/27/2012 Discharge date: 08/03/2012  Discharge Diagnoses:  Principal Problem:   Tibial plateau fracture Active Problems:   Type II or unspecified type diabetes mellitus without mention of complication, uncontrolled   Narcotic induced constipation   Acute blood loss anemia   Discharged Condition:  Good  Labs:  Basic Metabolic Panel:  Recent Labs Lab 07/28/12 0615  NA 131*  K 3.9  CL 97  CO2 28  GLUCOSE 271*  BUN 17  CREATININE 1.03  CALCIUM 8.3*    CBC:  Recent Labs Lab 07/28/12 0615  WBC 7.2  NEUTROABS 5.0  HGB 9.4*  HCT 26.5*  MCV 82.3  PLT 220    CBG:  Recent Labs Lab 08/02/12 0713 08/02/12 1114 08/02/12 1704 08/02/12 2102 08/03/12 1113  GLUCAP 168* 170* 129* 216* 243*   Filed Vitals:   08/01/12 1700 08/02/12 0500 08/02/12 1520 08/03/12 0512  BP: 146/96 144/70 134/76 130/69  Pulse: 98 81 99 76  Temp: 99.1 F (37.3 C) 98.3 F (36.8 C) 99.4 F (37.4 C) 98.1 F (36.7 C)  TempSrc: Oral Oral Oral Oral  Resp: 19 18 20 20   SpO2: 100% 98% 100% 97%    HPI:   Billy Horn is a 67 y.o. male with history of DM, prostate cancer who fell while trying to slide furniture down some stairs and sustained an injury to his left knee on 07/23/12. Xrays revealed comminuted depressed left tibial plateau fracture. He was evaluated by Dr. Montez Morita and underwent ORIF left tibial plateau on 04/06. Post op NWB LLE and on coumadin for DVT prophylaxis. Post op fever treated with IS. Therapies ongoing and patient making good progress but limited by fatigue. Therapy team recommended CIR for progression and pt was ultimately admitted today.   Hospital Course: Billy Horn was admitted to rehab 07/27/2012 for inpatient therapies to consist of PT, ST and OT at least three hours five days a week. Past admission physiatrist, therapy team and rehab RN have worked together  to provide customized collaborative inpatient rehab. Wound was monitored daily and has been healing without signs or symptoms of infection. Pain control has been reasonable with use of prn oxycodone and ice. His blood sugars were monitored on AC/HS basis and were uncontrolled ranging from 180 to mid 200 range. Hgb A1C was elevated at 7.0 and he was started on metformin to help with management of his diabetes. Blood pressures have been uncontrolled and he was started on cozaar for treatment. He is to follow up with primary MD for further titration of these medication as needed. .    Rehab team has conferred on patient's progress and therapies have focused on barriers to discharge. Patient has made great progress and is  Independent for transfers and ambulation of short distances. He has had improvement in activity tolerance, balance, ability to compensate for deficits and as well as improved strength. Patient is able to complete ADL and basic home management tasks at Modified Independent to supervision level. Friends will provide assistance as needed.   Disposition: Home    Diet: Carb modified medium.  Special Instructions:    1. He is to continue on NWB with KI on LLE till cleared by Dr. Montez Morita. Coumadin protocol by [facility] per Dr. Celene Skeen recommendations. INR therapeutic at 2.15. HHRN to recheck protime on 04/14.  2. Check blood sugars twice a day and record.  Medication List    TAKE these medications       acetaminophen 325 MG tablet  Commonly known as:  TYLENOL  Take 1-2 tablets (325-650 mg total) by mouth every 4 (four) hours as needed.     ferrous sulfate 325 (65 FE) MG tablet  Take 1 tablet (325 mg total) by mouth 2 (two) times daily with a meal. For anemia     losartan 50 MG tablet  Commonly known as:  COZAAR  Take 1 tablet (50 mg total) by mouth daily.     METAMUCIL 0.52 G capsule  Generic drug:  psyllium  Take 1.04 g by mouth daily.     metFORMIN 500 MG tablet  Commonly  known as:  GLUCOPHAGE  Take 1 tablet (500 mg total) by mouth daily with breakfast. For diabetes     methocarbamol 500 MG tablet  Commonly known as:  ROBAXIN  Take 1 tablet (500 mg total) by mouth every 6 (six) hours as needed (spasms).     multivitamin with minerals Tabs  Take 1 tablet by mouth daily.     oxyCODONE 5 MG immediate release tablet -- Rx# 45  Commonly known as:  Oxy IR/ROXICODONE  Take 1-2 tablets (5-10 mg total) by mouth every 6 (six) hours as needed. For severe pain     polyethylene glycol packet  Commonly known as:  MIRALAX / GLYCOLAX  Take 17 g by mouth daily.     warfarin 2 MG tablet  Commonly known as:  COUMADIN  Take 1 tablet (2 mg total) by mouth daily at 6 pm     zolpidem 5 MG tablet  Commonly known as:  AMBIEN  Take 1 tablet (5 mg total) by mouth at bedtime as needed for sleep.       Follow-up Information   Call Ranelle Oyster, MD. (As needed)    Contact information:   [address], [address] [place] [address] [phone]       Follow up with [name], MD. Call today. (for follow up appointment)    Contact information:   [address] [place] [address] [phone]       Call Benita Stabile, MD. [facility] CHECK IN 4 WEEKS AS MOBILITY IMPROVES)    Contact information:   [facility] [address], [place] [address] [phone]       Signed: Jacquelynn Cree 08/03/2012, 2:24 PM

## 2012-08-03 LAB — GLUCOSE, CAPILLARY

## 2012-08-03 LAB — PROTIME-INR
INR: 2.15 — ABNORMAL HIGH (ref 0.00–1.49)
Prothrombin Time: 23.1 seconds — ABNORMAL HIGH (ref 11.6–15.2)

## 2012-08-03 MED ORDER — WARFARIN SODIUM 2.5 MG PO TABS
2.5000 mg | ORAL_TABLET | Freq: Every day | ORAL | Status: DC
  Filled 2012-08-03: qty 1

## 2012-08-03 MED ORDER — WARFARIN - PHARMACIST DOSING INPATIENT: Freq: Every day | Status: DC

## 2012-08-03 MED ORDER — WARFARIN SODIUM 3 MG PO TABS
3.0000 mg | ORAL_TABLET | Freq: Once | ORAL | Status: AC
  Administered 2012-08-03: 3 mg via ORAL
  Filled 2012-08-03: qty 1

## 2012-08-03 NOTE — Progress Notes (Signed)
Social Work  Discharge Note  The overall goal for the admission was met for:   Discharge location: Yes - home with intermittent assist  Length of Stay: Yes -  7 days  Discharge activity level: Yes - modified ind to supervision (stairs)  Home/community participation: Yes  Services provided included: MD, RD, PT, OT, RN, TR, Pharmacy and SW  Financial Services: Private Insurance: [employer] managed  Follow-up services arranged: Home Health: RN, PT, OT via [facility], DME: 18x18 Breezy w/c, cushion, 3n1 commode, roll.walker, tub bench - [facility] and Patient/Family has no preference for HH/DME agencies  Comments (or additional information):  Patient/Family verbalized understanding of follow-up arrangements: Yes  Individual responsible for coordination of the follow-up plan: patient  Confirmed correct DME delivered: Keshauna Degraffenreid 08/03/2012    Tenessa Marsee

## 2012-08-03 NOTE — Progress Notes (Signed)
Patient ID: Billy Horn, male   DOB: Jan 22, 1946, 67 y.o.   MRN: [id number] Subjective/Complaints: No complaints. Met mob goals, still takes 4-6 oxycodone per day A 12 point review of systems has been performed and if not noted above is otherwise negative.  Review of Systems  Musculoskeletal: Positive for joint pain.  All other systems reviewed and are negative.   Objective: Vital Signs: Blood pressure 130/69, pulse 76, temperature 98.1 F (36.7 C), temperature source Oral, resp. rate 20, SpO2 97.00%. No results found. No results found for this basename: WBC, HGB, HCT, PLT,  in the last 72 hours No results found for this basename: NA, K, CL, CO, GLUCOSE, BUN, CREATININE, CALCIUM,  in the last 72 hours CBG (last 3)   Recent Labs  08/02/12 1114 08/02/12 1704 08/02/12 2102  GLUCAP 170* 129* 216*    Wt Readings from Last 3 Encounters:  07/24/12 83.915 kg (185 lb)  07/24/12 83.915 kg (185 lb)  04/06/11 90.7 kg (199 lb 15.3 oz)    Physical Exam:  Constitutional: He is oriented to person, place, and time. He appears well-developed and well-nourished.  HENT:  Head: Normocephalic and atraumatic.  Dry scabbed abrasion left cheek.  Eyes: Pupils are equal, round, and reactive to light.  Cardiovascular: Normal rate and regular rhythm.  Pulmonary/Chest: Effort normal and breath sounds normal. No respiratory distress. He has no wheezes.  Abdominal: Soft. Bowel sounds are normal. He exhibits no distension. There is no tenderness.  Musculoskeletal:  Left knee with mild effusion.. Area is appropriately tender.  1+ pedal edema on left. L-biceps area with resolving ecchymosis.  Neurological: He is alert and oriented to person, place, and time. Strength functional with pain inhibition at the left leg/knee.  Skin: Skin is warm and dry, incision CDI.    Assessment/Plan: 1. Functional deficits secondary to left tibial plateau fx which require 3+ hours per day of interdisciplinary therapy in a  comprehensive inpatient rehab setting. Stable for D/C today F/u PCP in 1-2 weeks F/u ortho 1  weeks See D/C summary See D/C instructions FIM: FIM - Bathing Bathing Steps Patient Completed: Chest;Right Arm;Left Arm;Abdomen;Front perineal area;Buttocks;Right upper leg;Left upper leg;Left lower leg (including foot);Right lower leg (including foot) Bathing: 6: Assistive device (Comment) (LH sponge)  FIM - Upper Body Dressing/Undressing Upper body dressing/undressing steps patient completed: Thread/unthread right sleeve of pullover shirt/dresss;Thread/unthread left sleeve of pullover shirt/dress;Put head through opening of pull over shirt/dress;Pull shirt over trunk Upper body dressing/undressing: 6: More than reasonable amount of time FIM - Lower Body Dressing/Undressing Lower body dressing/undressing steps patient completed: Thread/unthread right underwear leg;Thread/unthread left underwear leg;Pull underwear up/down;Don/Doff right sock;Don/Doff left sock;Don/Doff right shoe;Fasten/unfasten right shoe Lower body dressing/undressing: 5: Set-up assist to: Don/Doff TED stocking (reacher, sock aid)  FIM - Toileting Toileting steps completed by patient: Adjust clothing prior to toileting;Performs perineal hygiene;Adjust clothing after toileting Toileting Assistive Devices: Grab bar or rail for support Toileting: 6: More than reasonable amount of time  FIM - Diplomatic Services operational officer Devices: Grab bars;Walker;Elevated toilet seat Toilet Transfers: 6-To toilet/ BSC;6-From toilet/BSC  FIM - Banker Devices: Arm rests Bed/Chair Transfer: 6: Supine > Sit: No assist;6: Sit > Supine: No assist;6: Bed > Chair or W/C: No assist;6: Chair or W/C > Bed: No assist (leg lifter used occasionally)  FIM - Locomotion: Wheelchair Distance: 150 Locomotion: Wheelchair: 6: Travels 150 ft or more, turns around, maneuvers to table, bed or toilet, negotiates  3% grade: maneuvers on rugs and over  door sills independently FIM - Locomotion: Ambulation Locomotion: Ambulation Assistive Devices: Designer, industrial/product Ambulation/Gait Assistance: 6: Modified independent (Device/Increase time) Locomotion: Ambulation: 5: Household Independent - travels 50 - 149 ft independent or modified independent  Comprehension Comprehension Mode: Auditory Comprehension: 7-Follows complex conversation/direction: With no assist  Expression Expression Mode: Verbal Expression: 7-Expresses complex ideas: With no assist  Social Interaction Social Interaction: 7-Interacts appropriately with others - No medications needed.  Problem Solving Problem Solving: 7-Solves complex problems: Recognizes & self-corrects  Memory Memory: 7-Complete Independence: No helper Medical Problem List and Plan:  1. DVT Prophylaxis/Anticoagulation: Pharmaceutical: Coumadin  2. Pain Management: prn oxycodone is effective. Will continue. Encouraged use of ice post therapies. KI 3. Mood: Motivated and has good/realistic outlook. LCSW to follow for evaluation.  4. Neuropsych: This patient is capable of making decisions on his/her own behalf.  5. DM diet controlled: will monitor BS with AC/HS cbg checks and use SSI to control elevated BS. Should Improve with increase in activity level.   -follow for pattern 6. Constipation: will schedule miralax daily.  7. Leukocytosis: resolved, afebrile 8. ABLA: Ferrous sulfate 9.  Wound, looks like staples can stay in longer, f/u with ortho as outpt   LOS (Days) 7 A FACE TO FACE EVALUATION WAS PERFORMED  Erick Colace 08/03/2012 9:37 AM

## 2012-08-03 NOTE — Patient Care Conference (Signed)
Inpatient RehabilitationTeam Conference and Plan of Care Update Date: 08/02/2012   Time: 2:20 PM    Patient Name: Billy Horn      Medical Record Number: [id number]  Date of Birth: 1945-05-02 Sex: Male         Room/Bed: 4011/4011-01 Payor Info: Payor: [employer]  Plan: [employer] MANAGED  Product Type: *No Product type*     Admitting Diagnosis: L T16 Plat Fx  Admit Date/Time:  07/27/2012  1:07 PM Admission Comments: No comment available   Primary Diagnosis:  Tibial plateau fracture Principal Problem: Tibial plateau fracture  Patient Active Problem List   Diagnosis Date Noted  . Acute blood loss anemia 08/02/2012  . Type II or unspecified type diabetes mellitus without mention of complication, uncontrolled 07/27/2012  . Narcotic induced constipation 07/27/2012  . Tibial plateau fracture 07/27/2012    Expected Discharge Date: Expected Discharge Date: 08/03/12  Team Members Present: Physician leading conference: Dr. Claudette Laws Social Worker Present: Amada Jupiter, LCSW Nurse Present: Other (comment) Keturah Barre, RN) PT Present: Karolee Stamps, PT OT Present: Mackie Pai, OT Other (Discipline and Name): Tora Duck, PPS Coordinator; Bayard Hugger, RN     Current Status/Progress Goal Weekly Team Focus  Medical   wound looks good, still needs staples,pain control is good  Maintain stability for D/C arrange f/u  focus on steps   Bowel/Bladder   Continent bowel and bladder  Continent of bowel and bladder  Monitor and assist as needed    Swallow/Nutrition/ Hydration             ADL's   Mod I overall with AE, Supervision for LB dressing & tub bench transfers  Mod I overall with AE, Supervision for LB dressing & tub bench transfers  Goals met for OT, ready for discharge tomorrow   Mobility   S overall with transfers and gait; steady A stairs; mod I w/c mobility  mod I overall w/c level and for household gait; S stairs (bump up on bottom)  d/c planning, endurance/activity tolerance, HEP    Communication             Safety/Cognition/ Behavioral Observations            Pain   Pain maintain with Oxycodone 5-10mg  q4  Pain at or below 3  Monitor and treat pain as needed   Skin   Incision to left knee with staples and dry dressing  No further skin breakdown or infection  Monitor and daily dressing changes    Rehab Goals Patient on target to meet rehab goals: Yes *See Care Plan and progress notes for long and short-term goals.  Barriers to Discharge: stairs at home    Possible Resolutions to Barriers:  additional pt /caregiver training    Discharge Planning/Teaching Needs:  Home with intermittent assistance from local friends.      Team Discussion:  Has met all goals and ready for d/c on 4/16.  No concerns.  Revisions to Treatment Plan:  None   Continued Need for Acute Rehabilitation Level of Care: The patient requires daily medical management by a physician with specialized training in physical medicine and rehabilitation for the following conditions: Daily direction of a multidisciplinary physical rehabilitation program to ensure safe treatment while eliciting the highest outcome that is of practical value to the patient.: Yes Daily medical management of patient stability for increased activity during participation in an intensive rehabilitation regime.: Yes Daily analysis of laboratory values and/or radiology reports with any subsequent need for medication  adjustment of medical intervention for : Post surgical problems;Other  Billy Horn 08/03/2012, 8:49 AM

## 2012-08-03 NOTE — Progress Notes (Signed)
ANTICOAGULATION CONSULT NOTE - Follow Up Consult  Pharmacy Consult for coumadin Indication: VTE prophylaxis  No Known Allergies  Patient Measurements:   Heparin Dosing Weight:   Vital Signs: Temp: 98.1 F (36.7 C) (04/16 0512) Temp src: Oral (04/16 0512) BP: 130/69 mmHg (04/16 0512) Pulse Rate: 76 (04/16 0512)  Labs:  Recent Labs  08/01/12 0606 08/03/12 0555  LABPROT 25.3* 23.1*  INR 2.43* 2.15*    The CrCl is unknown because both a height and weight (above a minimum accepted value) are required for this calculation.   Medications:  Scheduled:  . ferrous sulfate  325 mg Oral BID WC  . insulin aspart  0-5 Units Subcutaneous QHS  . insulin aspart  0-9 Units Subcutaneous TID WC  . losartan  50 mg Oral Daily  . metFORMIN  500 mg Oral Q breakfast  . polyethylene glycol  17 g Oral Daily  . warfarin  2 mg Oral q1800  . Warfarin - Pharmacist Dosing Inpatient   Does not apply q1800   Infusions:    Assessment: 67 yo male s/p ORIF is currently on therapeutic coumadin for VTE prophylaxis.  INR today is 2.43 > 2/15, all recent doses charted correctly. No new cbc, no bleeding per chart  Goal of Therapy:  INR 2-3    Plan:  1) Change coumadin to 2.5mg  po daily 2) Continue INR to MWF  Bayard Hugger, PharmD, BCPS  Clinical Pharmacist  Pager: [phone]   08/03/2012,9:42 AM

## 2012-08-03 NOTE — Progress Notes (Signed)
1640: Pt discharged to home, accompanied by his girlfriend.

## 2012-08-26 ENCOUNTER — Ambulatory Visit
Admission: RE | Admit: 2012-08-26 | Discharge: 2012-08-26 | Disposition: A | Discharge status: Home / Self Care | Payer: Managed Care, Other (non HMO) | Source: Ambulatory Visit | Attending: Orthopedic Surgery | Admitting: Orthopedic Surgery

## 2012-08-26 ENCOUNTER — Other Ambulatory Visit: Payer: Self-pay | Admitting: Orthopedic Surgery

## 2012-08-26 DIAGNOSIS — T148XXA Other injury of unspecified body region, initial encounter: Secondary | ICD-10-CM

## 2012-09-20 ENCOUNTER — Ambulatory Visit: Payer: Managed Care, Other (non HMO) | Attending: Orthopedic Surgery

## 2012-09-20 DIAGNOSIS — M6281 Muscle weakness (generalized): Secondary | ICD-10-CM | POA: Insufficient documentation

## 2012-09-20 DIAGNOSIS — M25569 Pain in unspecified knee: Secondary | ICD-10-CM | POA: Insufficient documentation

## 2012-09-20 DIAGNOSIS — M25669 Stiffness of unspecified knee, not elsewhere classified: Secondary | ICD-10-CM | POA: Insufficient documentation

## 2012-09-20 DIAGNOSIS — IMO0001 Reserved for inherently not codable concepts without codable children: Secondary | ICD-10-CM | POA: Insufficient documentation

## 2012-09-20 DIAGNOSIS — R609 Edema, unspecified: Secondary | ICD-10-CM | POA: Insufficient documentation

## 2012-09-20 DIAGNOSIS — R269 Unspecified abnormalities of gait and mobility: Secondary | ICD-10-CM | POA: Insufficient documentation

## 2012-09-21 ENCOUNTER — Ambulatory Visit: Payer: Managed Care, Other (non HMO) | Admitting: Physical Therapy

## 2012-09-22 ENCOUNTER — Ambulatory Visit: Payer: Managed Care, Other (non HMO) | Admitting: Physical Therapy

## 2012-09-26 ENCOUNTER — Ambulatory Visit: Payer: Managed Care, Other (non HMO) | Admitting: Physical Therapy

## 2012-09-27 ENCOUNTER — Ambulatory Visit: Payer: Managed Care, Other (non HMO)

## 2012-09-29 ENCOUNTER — Ambulatory Visit: Payer: Managed Care, Other (non HMO)

## 2012-10-03 ENCOUNTER — Ambulatory Visit: Payer: Managed Care, Other (non HMO) | Admitting: Physical Therapy

## 2012-10-05 ENCOUNTER — Ambulatory Visit: Payer: Managed Care, Other (non HMO)

## 2012-10-07 ENCOUNTER — Ambulatory Visit: Payer: Managed Care, Other (non HMO) | Admitting: Physical Therapy

## 2012-10-10 ENCOUNTER — Ambulatory Visit: Payer: Managed Care, Other (non HMO)

## 2012-10-12 ENCOUNTER — Ambulatory Visit: Payer: Managed Care, Other (non HMO)

## 2012-10-14 ENCOUNTER — Ambulatory Visit: Payer: Managed Care, Other (non HMO) | Admitting: Physical Therapy

## 2012-10-17 ENCOUNTER — Ambulatory Visit: Payer: Managed Care, Other (non HMO)

## 2012-10-18 ENCOUNTER — Ambulatory Visit: Payer: Managed Care, Other (non HMO) | Attending: Orthopedic Surgery

## 2012-10-18 DIAGNOSIS — M6281 Muscle weakness (generalized): Secondary | ICD-10-CM | POA: Insufficient documentation

## 2012-10-18 DIAGNOSIS — R269 Unspecified abnormalities of gait and mobility: Secondary | ICD-10-CM | POA: Insufficient documentation

## 2012-10-18 DIAGNOSIS — M25569 Pain in unspecified knee: Secondary | ICD-10-CM | POA: Insufficient documentation

## 2012-10-18 DIAGNOSIS — IMO0001 Reserved for inherently not codable concepts without codable children: Secondary | ICD-10-CM | POA: Insufficient documentation

## 2012-10-18 DIAGNOSIS — R609 Edema, unspecified: Secondary | ICD-10-CM | POA: Insufficient documentation

## 2012-10-18 DIAGNOSIS — M25669 Stiffness of unspecified knee, not elsewhere classified: Secondary | ICD-10-CM | POA: Insufficient documentation

## 2012-10-19 ENCOUNTER — Ambulatory Visit: Payer: Managed Care, Other (non HMO) | Admitting: Physical Therapy

## 2012-10-24 ENCOUNTER — Ambulatory Visit: Payer: Managed Care, Other (non HMO)

## 2012-10-26 ENCOUNTER — Ambulatory Visit: Payer: Managed Care, Other (non HMO)

## 2012-10-28 ENCOUNTER — Ambulatory Visit: Payer: Managed Care, Other (non HMO) | Admitting: Physical Therapy

## 2012-11-01 ENCOUNTER — Ambulatory Visit: Payer: Managed Care, Other (non HMO)

## 2012-11-03 ENCOUNTER — Ambulatory Visit: Payer: Managed Care, Other (non HMO) | Admitting: Physical Therapy

## 2012-11-08 ENCOUNTER — Ambulatory Visit: Payer: Managed Care, Other (non HMO)

## 2012-11-25 ENCOUNTER — Ambulatory Visit: Payer: Managed Care, Other (non HMO) | Attending: Orthopedic Surgery | Admitting: Physical Therapy

## 2012-11-25 DIAGNOSIS — R609 Edema, unspecified: Secondary | ICD-10-CM | POA: Insufficient documentation

## 2012-11-25 DIAGNOSIS — R269 Unspecified abnormalities of gait and mobility: Secondary | ICD-10-CM | POA: Insufficient documentation

## 2012-11-25 DIAGNOSIS — M25569 Pain in unspecified knee: Secondary | ICD-10-CM | POA: Insufficient documentation

## 2012-11-25 DIAGNOSIS — M6281 Muscle weakness (generalized): Secondary | ICD-10-CM | POA: Insufficient documentation

## 2012-11-25 DIAGNOSIS — M25669 Stiffness of unspecified knee, not elsewhere classified: Secondary | ICD-10-CM | POA: Insufficient documentation

## 2012-11-25 DIAGNOSIS — IMO0001 Reserved for inherently not codable concepts without codable children: Secondary | ICD-10-CM | POA: Insufficient documentation

## 2012-11-28 ENCOUNTER — Ambulatory Visit: Payer: Managed Care, Other (non HMO)

## 2012-11-30 ENCOUNTER — Ambulatory Visit: Payer: Managed Care, Other (non HMO) | Admitting: Physical Therapy

## 2012-12-02 ENCOUNTER — Ambulatory Visit: Payer: Managed Care, Other (non HMO) | Admitting: Physical Therapy

## 2015-03-26 DIAGNOSIS — I10 Essential (primary) hypertension: Secondary | ICD-10-CM | POA: Diagnosis not present

## 2015-03-26 DIAGNOSIS — Z23 Encounter for immunization: Secondary | ICD-10-CM | POA: Diagnosis not present

## 2015-03-26 DIAGNOSIS — E119 Type 2 diabetes mellitus without complications: Secondary | ICD-10-CM | POA: Diagnosis not present

## 2015-04-05 DIAGNOSIS — E119 Type 2 diabetes mellitus without complications: Secondary | ICD-10-CM | POA: Diagnosis not present

## 2015-04-05 DIAGNOSIS — H35372 Puckering of macula, left eye: Secondary | ICD-10-CM | POA: Diagnosis not present

## 2015-04-05 DIAGNOSIS — H25813 Combined forms of age-related cataract, bilateral: Secondary | ICD-10-CM | POA: Diagnosis not present

## 2015-05-17 DIAGNOSIS — C61 Malignant neoplasm of prostate: Secondary | ICD-10-CM | POA: Diagnosis not present

## 2015-05-24 DIAGNOSIS — N5231 Erectile dysfunction following radical prostatectomy: Secondary | ICD-10-CM | POA: Diagnosis not present

## 2015-05-24 DIAGNOSIS — Z8546 Personal history of malignant neoplasm of prostate: Secondary | ICD-10-CM | POA: Diagnosis not present

## 2015-05-24 DIAGNOSIS — Z0000 Encounter for general adult medical examination without abnormal findings: Secondary | ICD-10-CM | POA: Diagnosis not present

## 2015-07-23 DIAGNOSIS — J069 Acute upper respiratory infection, unspecified: Secondary | ICD-10-CM | POA: Diagnosis not present

## 2015-09-25 DIAGNOSIS — R69 Illness, unspecified: Secondary | ICD-10-CM | POA: Diagnosis not present

## 2015-10-01 DIAGNOSIS — I10 Essential (primary) hypertension: Secondary | ICD-10-CM | POA: Diagnosis not present

## 2015-10-01 DIAGNOSIS — E119 Type 2 diabetes mellitus without complications: Secondary | ICD-10-CM | POA: Diagnosis not present

## 2015-12-24 DIAGNOSIS — R69 Illness, unspecified: Secondary | ICD-10-CM | POA: Diagnosis not present

## 2016-03-17 DIAGNOSIS — Z23 Encounter for immunization: Secondary | ICD-10-CM | POA: Diagnosis not present

## 2016-03-23 DIAGNOSIS — M1712 Unilateral primary osteoarthritis, left knee: Secondary | ICD-10-CM | POA: Diagnosis not present

## 2016-03-31 DIAGNOSIS — M1712 Unilateral primary osteoarthritis, left knee: Secondary | ICD-10-CM | POA: Diagnosis not present

## 2016-04-01 DIAGNOSIS — I10 Essential (primary) hypertension: Secondary | ICD-10-CM | POA: Diagnosis not present

## 2016-04-01 DIAGNOSIS — E119 Type 2 diabetes mellitus without complications: Secondary | ICD-10-CM | POA: Diagnosis not present

## 2016-04-01 DIAGNOSIS — E1165 Type 2 diabetes mellitus with hyperglycemia: Secondary | ICD-10-CM | POA: Diagnosis not present

## 2016-04-03 DIAGNOSIS — Z6825 Body mass index (BMI) 25.0-25.9, adult: Secondary | ICD-10-CM | POA: Diagnosis not present

## 2016-04-03 DIAGNOSIS — E119 Type 2 diabetes mellitus without complications: Secondary | ICD-10-CM | POA: Diagnosis not present

## 2016-04-03 DIAGNOSIS — Z0000 Encounter for general adult medical examination without abnormal findings: Secondary | ICD-10-CM | POA: Diagnosis not present

## 2016-04-07 DIAGNOSIS — H25811 Combined forms of age-related cataract, right eye: Secondary | ICD-10-CM | POA: Diagnosis not present

## 2016-04-07 DIAGNOSIS — H25812 Combined forms of age-related cataract, left eye: Secondary | ICD-10-CM | POA: Diagnosis not present

## 2016-04-07 DIAGNOSIS — H35372 Puckering of macula, left eye: Secondary | ICD-10-CM | POA: Diagnosis not present

## 2016-04-16 DIAGNOSIS — M1712 Unilateral primary osteoarthritis, left knee: Secondary | ICD-10-CM | POA: Diagnosis not present

## 2016-05-21 DIAGNOSIS — R69 Illness, unspecified: Secondary | ICD-10-CM | POA: Diagnosis not present

## 2016-08-06 DIAGNOSIS — R69 Illness, unspecified: Secondary | ICD-10-CM | POA: Diagnosis not present

## 2016-08-06 DIAGNOSIS — G4700 Insomnia, unspecified: Secondary | ICD-10-CM | POA: Diagnosis not present

## 2016-08-16 DIAGNOSIS — R69 Illness, unspecified: Secondary | ICD-10-CM | POA: Diagnosis not present

## 2016-09-04 DIAGNOSIS — R112 Nausea with vomiting, unspecified: Secondary | ICD-10-CM | POA: Diagnosis not present

## 2016-09-08 DIAGNOSIS — R748 Abnormal levels of other serum enzymes: Secondary | ICD-10-CM | POA: Diagnosis not present

## 2016-09-08 DIAGNOSIS — R509 Fever, unspecified: Secondary | ICD-10-CM | POA: Diagnosis not present

## 2016-09-08 DIAGNOSIS — R112 Nausea with vomiting, unspecified: Secondary | ICD-10-CM | POA: Diagnosis not present

## 2016-09-08 DIAGNOSIS — R300 Dysuria: Secondary | ICD-10-CM | POA: Diagnosis not present

## 2016-09-09 ENCOUNTER — Other Ambulatory Visit: Payer: Self-pay | Admitting: Family Medicine

## 2016-09-09 DIAGNOSIS — R112 Nausea with vomiting, unspecified: Secondary | ICD-10-CM

## 2016-09-09 DIAGNOSIS — R748 Abnormal levels of other serum enzymes: Secondary | ICD-10-CM

## 2016-09-11 ENCOUNTER — Other Ambulatory Visit (HOSPITAL_COMMUNITY): Payer: Self-pay | Admitting: Family Medicine

## 2016-09-11 ENCOUNTER — Ambulatory Visit
Admission: RE | Admit: 2016-09-11 | Discharge: 2016-09-11 | Disposition: A | Discharge status: Home / Self Care | Payer: Medicare HMO | Source: Ambulatory Visit | Attending: Family Medicine | Admitting: Family Medicine

## 2016-09-11 DIAGNOSIS — R112 Nausea with vomiting, unspecified: Secondary | ICD-10-CM

## 2016-09-11 DIAGNOSIS — R160 Hepatomegaly, not elsewhere classified: Secondary | ICD-10-CM

## 2016-09-11 DIAGNOSIS — K8020 Calculus of gallbladder without cholecystitis without obstruction: Secondary | ICD-10-CM | POA: Diagnosis not present

## 2016-09-11 DIAGNOSIS — R748 Abnormal levels of other serum enzymes: Secondary | ICD-10-CM

## 2016-09-15 DIAGNOSIS — R944 Abnormal results of kidney function studies: Secondary | ICD-10-CM | POA: Diagnosis not present

## 2016-09-18 ENCOUNTER — Ambulatory Visit (HOSPITAL_COMMUNITY)
Admission: RE | Admit: 2016-09-18 | Discharge: 2016-09-18 | Disposition: A | Discharge status: Home / Self Care | Payer: Medicare HMO | Source: Ambulatory Visit | Attending: Family Medicine | Admitting: Family Medicine

## 2016-09-18 DIAGNOSIS — R160 Hepatomegaly, not elsewhere classified: Secondary | ICD-10-CM | POA: Insufficient documentation

## 2016-09-18 DIAGNOSIS — K769 Liver disease, unspecified: Secondary | ICD-10-CM | POA: Insufficient documentation

## 2016-09-18 DIAGNOSIS — K651 Peritoneal abscess: Secondary | ICD-10-CM | POA: Diagnosis not present

## 2016-09-18 MED ORDER — GADOBENATE DIMEGLUMINE 529 MG/ML IV SOLN
20.0000 mL | Freq: Once | INTRAVENOUS | Status: AC | PRN
  Administered 2016-09-18: 17 mL via INTRAVENOUS

## 2016-09-21 ENCOUNTER — Ambulatory Visit (HOSPITAL_COMMUNITY): Payer: Medicare HMO

## 2016-09-27 ENCOUNTER — Encounter (HOSPITAL_COMMUNITY): Payer: Self-pay | Admitting: Emergency Medicine

## 2016-09-27 ENCOUNTER — Inpatient Hospital Stay (HOSPITAL_COMMUNITY)
Admission: EM | Admit: 2016-09-27 | Discharge: 2016-10-04 | DRG: 871 | Disposition: A | Discharge status: Home Health | Payer: Medicare HMO | Attending: Family Medicine | Admitting: Family Medicine

## 2016-09-27 ENCOUNTER — Emergency Department (HOSPITAL_COMMUNITY): Payer: Medicare HMO

## 2016-09-27 DIAGNOSIS — E876 Hypokalemia: Secondary | ICD-10-CM | POA: Diagnosis present

## 2016-09-27 DIAGNOSIS — Z801 Family history of malignant neoplasm of trachea, bronchus and lung: Secondary | ICD-10-CM

## 2016-09-27 DIAGNOSIS — E1165 Type 2 diabetes mellitus with hyperglycemia: Secondary | ICD-10-CM | POA: Diagnosis present

## 2016-09-27 DIAGNOSIS — R160 Hepatomegaly, not elsewhere classified: Secondary | ICD-10-CM | POA: Diagnosis present

## 2016-09-27 DIAGNOSIS — E43 Unspecified severe protein-calorie malnutrition: Secondary | ICD-10-CM | POA: Diagnosis present

## 2016-09-27 DIAGNOSIS — R5081 Fever presenting with conditions classified elsewhere: Secondary | ICD-10-CM

## 2016-09-27 DIAGNOSIS — K5732 Diverticulitis of large intestine without perforation or abscess without bleeding: Secondary | ICD-10-CM | POA: Diagnosis not present

## 2016-09-27 DIAGNOSIS — Z66 Do not resuscitate: Secondary | ICD-10-CM | POA: Diagnosis present

## 2016-09-27 DIAGNOSIS — B9620 Unspecified Escherichia coli [E. coli] as the cause of diseases classified elsewhere: Secondary | ICD-10-CM | POA: Diagnosis present

## 2016-09-27 DIAGNOSIS — K750 Abscess of liver: Secondary | ICD-10-CM | POA: Diagnosis present

## 2016-09-27 DIAGNOSIS — Z7901 Long term (current) use of anticoagulants: Secondary | ICD-10-CM

## 2016-09-27 DIAGNOSIS — R55 Syncope and collapse: Secondary | ICD-10-CM | POA: Diagnosis not present

## 2016-09-27 DIAGNOSIS — R509 Fever, unspecified: Secondary | ICD-10-CM | POA: Diagnosis not present

## 2016-09-27 DIAGNOSIS — IMO0002 Reserved for concepts with insufficient information to code with codable children: Secondary | ICD-10-CM | POA: Diagnosis present

## 2016-09-27 DIAGNOSIS — R9431 Abnormal electrocardiogram [ECG] [EKG]: Secondary | ICD-10-CM | POA: Diagnosis not present

## 2016-09-27 DIAGNOSIS — K5720 Diverticulitis of large intestine with perforation and abscess without bleeding: Secondary | ICD-10-CM | POA: Diagnosis present

## 2016-09-27 DIAGNOSIS — E871 Hypo-osmolality and hyponatremia: Secondary | ICD-10-CM | POA: Diagnosis present

## 2016-09-27 DIAGNOSIS — D649 Anemia, unspecified: Secondary | ICD-10-CM | POA: Diagnosis present

## 2016-09-27 DIAGNOSIS — Z8546 Personal history of malignant neoplasm of prostate: Secondary | ICD-10-CM | POA: Diagnosis not present

## 2016-09-27 DIAGNOSIS — D72825 Bandemia: Secondary | ICD-10-CM | POA: Diagnosis not present

## 2016-09-27 DIAGNOSIS — Z7984 Long term (current) use of oral hypoglycemic drugs: Secondary | ICD-10-CM

## 2016-09-27 DIAGNOSIS — K7689 Other specified diseases of liver: Secondary | ICD-10-CM | POA: Diagnosis not present

## 2016-09-27 DIAGNOSIS — R531 Weakness: Secondary | ICD-10-CM | POA: Diagnosis not present

## 2016-09-27 DIAGNOSIS — Z9079 Acquired absence of other genital organ(s): Secondary | ICD-10-CM

## 2016-09-27 DIAGNOSIS — E088 Diabetes mellitus due to underlying condition with unspecified complications: Secondary | ICD-10-CM | POA: Diagnosis not present

## 2016-09-27 DIAGNOSIS — K651 Peritoneal abscess: Secondary | ICD-10-CM | POA: Diagnosis not present

## 2016-09-27 DIAGNOSIS — Z7982 Long term (current) use of aspirin: Secondary | ICD-10-CM

## 2016-09-27 DIAGNOSIS — D509 Iron deficiency anemia, unspecified: Secondary | ICD-10-CM | POA: Diagnosis present

## 2016-09-27 DIAGNOSIS — A419 Sepsis, unspecified organism: Secondary | ICD-10-CM | POA: Diagnosis present

## 2016-09-27 DIAGNOSIS — B961 Klebsiella pneumoniae [K. pneumoniae] as the cause of diseases classified elsewhere: Secondary | ICD-10-CM | POA: Diagnosis present

## 2016-09-27 DIAGNOSIS — Z6822 Body mass index (BMI) 22.0-22.9, adult: Secondary | ICD-10-CM

## 2016-09-27 LAB — CBC WITH DIFFERENTIAL/PLATELET
Basophils Absolute: 0 10*3/uL (ref 0.0–0.1)
Basophils Relative: 0 %
Eosinophils Absolute: 0 10*3/uL (ref 0.0–0.7)
Eosinophils Relative: 0 %
HEMATOCRIT: 23.6 % — AB (ref 39.0–52.0)
HEMOGLOBIN: 7.7 g/dL — AB (ref 13.0–17.0)
LYMPHS ABS: 1.7 10*3/uL (ref 0.7–4.0)
Lymphocytes Relative: 11 %
MCH: 26.6 pg (ref 26.0–34.0)
MCHC: 32.6 g/dL (ref 30.0–36.0)
MCV: 81.4 fL (ref 78.0–100.0)
MONO ABS: 1.3 10*3/uL — AB (ref 0.1–1.0)
MONOS PCT: 8 %
NEUTROS ABS: 12.5 10*3/uL — AB (ref 1.7–7.7)
Neutrophils Relative %: 81 %
Platelets: 373 10*3/uL (ref 150–400)
RBC: 2.9 MIL/uL — ABNORMAL LOW (ref 4.22–5.81)
RDW: 13.1 % (ref 11.5–15.5)
WBC: 15.5 10*3/uL — ABNORMAL HIGH (ref 4.0–10.5)

## 2016-09-27 LAB — URINALYSIS, ROUTINE W REFLEX MICROSCOPIC
BILIRUBIN URINE: NEGATIVE
GLUCOSE, UA: NEGATIVE mg/dL
HGB URINE DIPSTICK: NEGATIVE
KETONES UR: NEGATIVE mg/dL
Leukocytes, UA: NEGATIVE
Nitrite: NEGATIVE
PH: 5 (ref 5.0–8.0)
Protein, ur: NEGATIVE mg/dL
SPECIFIC GRAVITY, URINE: 1.009 (ref 1.005–1.030)

## 2016-09-27 LAB — COMPREHENSIVE METABOLIC PANEL
ALBUMIN: 2.5 g/dL — AB (ref 3.5–5.0)
ALK PHOS: 158 U/L — AB (ref 38–126)
ALT: 43 U/L (ref 17–63)
ANION GAP: 8 (ref 5–15)
AST: 47 U/L — ABNORMAL HIGH (ref 15–41)
BUN: 17 mg/dL (ref 6–20)
CHLORIDE: 100 mmol/L — AB (ref 101–111)
CO2: 25 mmol/L (ref 22–32)
CREATININE: 1.04 mg/dL (ref 0.61–1.24)
Calcium: 8.6 mg/dL — ABNORMAL LOW (ref 8.9–10.3)
GFR calc Af Amer: >60 mL/min (ref >60)
GFR calc non Af Amer: >60 mL/min (ref >60)
GLUCOSE: 179 mg/dL — AB (ref 65–99)
Potassium: 4 mmol/L (ref 3.5–5.1)
SODIUM: 133 mmol/L — AB (ref 135–145)
Total Bilirubin: 0.6 mg/dL (ref 0.3–1.2)
Total Protein: 6.8 g/dL (ref 6.5–8.1)

## 2016-09-27 LAB — I-STAT CG4 LACTIC ACID, ED: LACTIC ACID, VENOUS: 0.7 mmol/L (ref 0.5–1.9)

## 2016-09-27 MED ORDER — PIPERACILLIN-TAZOBACTAM 3.375 G IVPB 30 MIN
3.3750 g | Freq: Once | INTRAVENOUS | Status: AC
  Administered 2016-09-27: 3.375 g via INTRAVENOUS
  Filled 2016-09-27: qty 50

## 2016-09-27 NOTE — ED Notes (Signed)
Bed: WA20 Expected date:  Expected time:  Means of arrival:  Comments: 71 yo fatigue, fever

## 2016-09-27 NOTE — ED Triage Notes (Addendum)
Per EMS pt coming from home having fever/fatigue since mid May. Pt found to have mass on liver a few weeks ago. Pt has appointment tomorrow with surgeon to discuss mass. Denies n/v, SOB and dizziness. Temp 102. 1,000mg  Tylenol given prior to arrival. 18g Left hand placed by EMS.

## 2016-09-27 NOTE — ED Notes (Signed)
Second set of blood cultures obtained in right AC 78ml each

## 2016-09-27 NOTE — ED Notes (Signed)
First set of blood cultures obtained from right forearm 59ml each

## 2016-09-27 NOTE — ED Notes (Signed)
Unable to draw blood from IV. Attempted with butterfly x3 no success.

## 2016-09-27 NOTE — ED Provider Notes (Signed)
[facility] DEPT Provider Note   CSN: [id number] Arrival date & time: 09/27/16  1922     History   Chief Complaint Chief Complaint  Patient presents with  . Fever  . Fatigue    HPI Billy Horn is a 71 y.o. male.   Fever   This is a new problem. Episode onset: 3 weeks. Episode frequency: constant for the first week, intermittent since. The problem has not changed since onset.The maximum temperature noted was 102 to 102.9 F. The temperature was taken using an oral thermometer. Pertinent negatives include no chest pain, no fussiness, no diarrhea, no vomiting, no congestion, no headaches, no sore throat, no muscle aches and no cough. Associated symptoms comments: Severe fatigue. He has tried nothing for the symptoms.   Patient reports recent diagnosis of liver mass on MRI showing necrotic core. Patient is scheduled to establish care with [place] [facility] for further evaluation and workup (likely for possible resection).  Past Medical History:  Diagnosis Date  . Cancer [facility])    prostate cancer  . Diabetes mellitus    controlled with diet and exercise    Patient Active Problem List   Diagnosis Date Noted  . Acute blood loss anemia 08/02/2012  . Type II or unspecified type diabetes mellitus without mention of complication, uncontrolled 07/27/2012  . Narcotic induced constipation 07/27/2012  . Tibial plateau fracture 07/27/2012    Past Surgical History:  Procedure Laterality Date  . ORIF TIBIA PLATEAU Left 07/23/2012   Procedure: OPEN REDUCTION INTERNAL FIXATION (ORIF) TIBIAL PLATEAU;  Surgeon: Sharmon Revere, MD;  Location: [facility] ORS;  Service: Orthopedics;  Laterality: Left;  . ROBOT ASSISTED LAPAROSCOPIC RADICAL PROSTATECTOMY  04/06/2011   Procedure: ROBOTIC ASSISTED LAPAROSCOPIC RADICAL PROSTATECTOMY LEVEL 2;  Surgeon: Dutch Gray, MD;  Location: [facility];  Service: Urology;  Laterality: N/A;  Bilateral Pelvic Lymphadenectomy   . TONSILLECTOMY     as a child        Home Medications    Prior to Admission medications   Medication Sig Start Date End Date Taking? Authorizing Provider  acetaminophen (TYLENOL) 325 MG tablet Take 1-2 tablets (325-650 mg total) by mouth every 4 (four) hours as needed. Patient taking differently: Take 325-650 mg by mouth every 4 (four) hours as needed for mild pain, moderate pain or headache.  08/02/12  Yes Love, Ivan Anchors, PA-C  aspirin EC 81 MG tablet Take 81 mg by mouth daily after breakfast.   Yes [provider]  Calcium Carbonate (CALCIUM-CARB 600 PO) Take 1 tablet by mouth daily after breakfast.   Yes [provider]  meloxicam (MOBIC) 15 MG tablet Take 15 mg by mouth daily after breakfast. with food 08/18/16  Yes [provider]  Multiple Vitamin (MULTIVITAMIN WITH MINERALS) TABS Take 1 tablet by mouth daily after breakfast.    Yes [provider]  psyllium (METAMUCIL) 0.52 G capsule Take 1.04 g by mouth daily after breakfast.    Yes [provider]  ferrous sulfate 325 (65 FE) MG tablet Take 1 tablet (325 mg total) by mouth 2 (two) times daily with a meal. For anemia Patient not taking: Reported on 09/27/2016 08/02/12   Love, Ivan Anchors, PA-C  losartan (COZAAR) 50 MG tablet Take 1 tablet (50 mg total) by mouth daily. Patient not taking: Reported on 09/27/2016 08/02/12   Love, Ivan Anchors, PA-C  metFORMIN (GLUCOPHAGE) 500 MG tablet Take 1 tablet (500 mg total) by mouth daily with breakfast. For diabetes Patient not taking: Reported on  09/27/2016 08/02/12   Bary Leriche, PA-C  warfarin (COUMADIN) 2 MG tablet Take 1 tablet (2 mg total) by mouth one time only at 6 PM. Patient not taking: Reported on 09/27/2016 08/02/12   Love, Ivan Anchors, PA-C  zolpidem (AMBIEN) 5 MG tablet Take 1 tablet (5 mg total) by mouth at bedtime as needed for sleep. Patient not taking: Reported on 09/27/2016 07/26/12   Marily Memos, MD    Family History Family History  Problem Relation Age of Onset  . Cancer  Mother     Social History Social History  Substance Use Topics  . Smoking status: Never Smoker  . Smokeless tobacco: Never Used  . Alcohol use Yes     Comment: occas wine     Allergies   Patient has no known allergies.   Review of Systems Review of Systems  Constitutional: Positive for fever.  HENT: Negative for congestion and sore throat.   Respiratory: Negative for cough.   Cardiovascular: Negative for chest pain.  Gastrointestinal: Negative for diarrhea and vomiting.  Neurological: Negative for headaches.  All other systems are reviewed and are negative for acute change except as noted in the HPI    Physical Exam Updated Vital Signs BP 139/80 (BP Location: Left Arm)   Pulse 88   Temp 100.1 F (37.8 C)   Resp 18   SpO2 97%   Physical Exam  Constitutional: He is oriented to person, place, and time. He appears well-developed and well-nourished. No distress.  HENT:  Head: Normocephalic and atraumatic.  Nose: Nose normal.  Eyes: Conjunctivae and EOM are normal. Pupils are equal, round, and reactive to light. Right eye exhibits no discharge. Left eye exhibits no discharge. No scleral icterus.  Neck: Normal range of motion. Neck supple.  Cardiovascular: Normal rate and regular rhythm.  Exam reveals no gallop and no friction rub.   No murmur heard. Pulmonary/Chest: Effort normal and breath sounds normal. No stridor. No respiratory distress. He has no rales.  Abdominal: Soft. He exhibits no distension. There is no tenderness.  Musculoskeletal: He exhibits no edema or tenderness.  Neurological: He is alert and oriented to person, place, and time.  Skin: Skin is warm and dry. No rash noted. He is not diaphoretic. No erythema.  Psychiatric: He has a normal mood and affect.  Vitals reviewed.    ED Treatments / Results  Labs (all labs ordered are listed, but only abnormal results are displayed) Labs Reviewed  COMPREHENSIVE METABOLIC PANEL - Abnormal; Notable for the  following:       Result Value   Sodium 133 (*)    Chloride 100 (*)    Glucose, Bld 179 (*)    Calcium 8.6 (*)    Albumin 2.5 (*)    AST 47 (*)    Alkaline Phosphatase 158 (*)    All other components within normal limits  CBC WITH DIFFERENTIAL/PLATELET - Abnormal; Notable for the following:    WBC 15.5 (*)    RBC 2.90 (*)    Hemoglobin 7.7 (*)    HCT 23.6 (*)    Neutro Abs 12.5 (*)    Monocytes Absolute 1.3 (*)    All other components within normal limits  CULTURE, BLOOD (ROUTINE X 2)  CULTURE, BLOOD (ROUTINE X 2)  URINALYSIS, ROUTINE W REFLEX MICROSCOPIC  I-STAT CG4 LACTIC ACID, ED  I-STAT CG4 LACTIC ACID, ED    EKG  EKG Interpretation None       Radiology Dg Chest 2 View  Result Date: 09/27/2016 CLINICAL DATA:  Subacute onset of worsening generalized weakness. Recently discovered liver mass. Initial encounter. EXAM: CHEST  2 VIEW COMPARISON:  Chest radiograph performed 07/25/2012 FINDINGS: The lungs are well-aerated and clear. There is no evidence of focal opacification, pleural effusion or pneumothorax. The heart is borderline normal in size. No acute osseous abnormalities are seen. IMPRESSION: No acute cardiopulmonary process seen. Electronically Signed   By: Garald Balding M.D.   On: 09/27/2016 21:33    Procedures Procedures (including critical care time)  Medications Ordered in ED Medications - No data to display   Initial Impression / Assessment and Plan / ED Course  I have reviewed the triage vital signs and the nursing notes.  Pertinent labs & imaging results that were available during my care of the patient were reviewed by me and considered in my medical decision making (see chart for details).     Patient is febrile with leukocytosis. Lactic acid within normal limits. Chest x-ray negative for respiratory infection. Urine evidence of infection. At this time source of infection is undetermined; however patient does have a hepatic mass with recent MRI  showing necrotic core, which may be the source of the patient's symptoms. We'll start patient on so since a cover for intra-abdominal infection. Will discuss case with internal medicine for admission and further workup and management.  Final Clinical Impressions(s) / ED Diagnoses   Final diagnoses:  Fever in other diseases  Bandemia  Liver mass      Suleman Gunning, Grayce Sessions, MD 09/27/16 2315

## 2016-09-27 NOTE — H&P (Signed)
History and Physical    Billy Horn STM:[id number] DOB: 11/18/1945 DOA: 09/27/2016  PCP: Alroy Dust, L.Marlou Sa, MD Consultants:  Barry Dienes - has upcoming appt Patient coming from: Home - lives alone; NOK: Juluis Rainier, best friend, [phone]  Chief Complaint: fatigue  HPI: Billy Horn is a 71 y.o. male with medical history significant of remote prostate CA and diabetes (not taking medications despite A1c of 7 - prefers diet and exercise) with newly recognized 9 x 7 x 10 cm lass largely replacing the lateral segment of the left liver and extending inferiorly with a large central area of probable necrosis, with several satellite nodules highly concerning for neoplasm and with 3 cm retrocaval LN concerning for metastatic disease (diagnosed by MRI 6/1).  He has been "on about a three week siege with fatigue and high temperatures."  Saw PCP and fever was 103.  He was started on  antibiotics (Ampicillin).  He was then scheduled for a CT and then MRI a week ago Friday.  Results last week showed a tumor on his liver.  He is awaiting additional testing and to meet with Dr. Barry Dienes in all probability for surgery.  Occasionally checks temperatures at home, 102 today.  He has extreme fatigue.  Today it was the worst of his 3 weeks and he just felt like he had to come in.  He is unable to complete normal day-to-day tasks.  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  He has to lie down after being up 30 minutes, just exhausted.  No SOB.  No pain.  Weight loss, maybe 20 pounds in the last 3 weeks.  Dr. Alroy Dust thinks the tumor is cancerous but he needs more testing before surgery.  H/o prostate CA surgery in 2012.  [place] and broke his tibia the next year with surgery (2014).  Other than that, he reports no real medical issues.  ED Course: Febrile with leukocytosis, normal lactate.  Negative CXR and UA.  Fever appears to be due to liver mass.  Started on Zosyn.  Review of  Systems: As per HPI; otherwise review of systems reviewed and negative.   Ambulatory Status:  Ambulates without assistance  Past Medical History:  Diagnosis Date  . Cancer [facility] 2012   prostate cancer  . Diabetes mellitus    controlled with diet and exercise    Past Surgical History:  Procedure Laterality Date  . ORIF TIBIA PLATEAU Left 07/23/2012   Procedure: OPEN REDUCTION INTERNAL FIXATION (ORIF) TIBIAL PLATEAU;  Surgeon: Sharmon Revere, MD;  Location: [facility];  Service: Orthopedics;  Laterality: Left;  . ROBOT ASSISTED LAPAROSCOPIC RADICAL PROSTATECTOMY  04/06/2011   Procedure: ROBOTIC ASSISTED LAPAROSCOPIC RADICAL PROSTATECTOMY LEVEL 2;  Surgeon: Dutch Gray, MD;  Location: [facility];  Service: Urology;  Laterality: N/A;  Bilateral Pelvic Lymphadenectomy   . TONSILLECTOMY     as a child    Social History   Social History  . Marital status: Single    Spouse name: N/A  . Number of children: N/A  . Years of education: N/A   Occupational History  . 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 Smoking status: Never Smoker  . Smokeless tobacco: Never Used  . Alcohol use Yes     Comment: occas wine  . Drug use: No  . Sexual activity: Not on file   Other Topics Concern  . Not on file   Social History  Narrative  . No narrative on file    No Known Allergies  Family History  Problem Relation Age of Onset  . Lung cancer Mother 51    Prior to Admission medications   Medication Sig Start Date End Date Taking? Authorizing Provider  acetaminophen (TYLENOL) 325 MG tablet Take 1-2 tablets (325-650 mg total) by mouth every 4 (four) hours as needed. Patient taking differently: Take 325-650 mg by mouth every 4 (four) hours as needed for mild pain, moderate pain or headache.  08/02/12  Yes Love, Ivan Anchors, PA-C  aspirin EC 81 MG tablet Take 81 mg by mouth daily after breakfast.   Yes [provider]  Calcium Carbonate (CALCIUM-CARB 600 PO) Take 1  tablet by mouth daily after breakfast.   Yes [provider]  meloxicam (MOBIC) 15 MG tablet Take 15 mg by mouth daily after breakfast. with food 08/18/16  Yes [provider]  Multiple Vitamin (MULTIVITAMIN WITH MINERALS) TABS Take 1 tablet by mouth daily after breakfast.    Yes [provider]  psyllium (METAMUCIL) 0.52 G capsule Take 1.04 g by mouth daily after breakfast.    Yes [provider]  ferrous sulfate 325 (65 FE) MG tablet Take 1 tablet (325 mg total) by mouth 2 (two) times daily with a meal. For anemia Patient not taking: Reported on 09/27/2016 08/02/12   Love, Ivan Anchors, PA-C  losartan (COZAAR) 50 MG tablet Take 1 tablet (50 mg total) by mouth daily. Patient not taking: Reported on 09/27/2016 08/02/12   Love, Ivan Anchors, PA-C  metFORMIN (GLUCOPHAGE) 500 MG tablet Take 1 tablet (500 mg total) by mouth daily with breakfast. For diabetes Patient not taking: Reported on 09/27/2016 08/02/12   Love, Ivan Anchors, PA-C  warfarin (COUMADIN) 2 MG tablet Take 1 tablet (2 mg total) by mouth one time only at 6 PM. Patient not taking: Reported on 09/27/2016 08/02/12   Love, Ivan Anchors, PA-C  zolpidem (AMBIEN) 5 MG tablet Take 1 tablet (5 mg total) by mouth at bedtime as needed for sleep. Patient not taking: Reported on 09/27/2016 07/26/12   Marily Memos, MD    Physical Exam: Vitals:   09/27/16 2130 09/27/16 2200 09/27/16 2230 09/27/16 2300  BP: 139/75 (!) 144/77 134/78 131/79  Pulse: 74 74 71 77  Resp: 13 17 17 20   Temp:      TempSrc:      SpO2: 98% 99% 96% 96%     General: Appears calm and comfortable and is NAD Eyes:  PERRL, EOMI, normal lids, iris ENT:  grossly normal hearing, lips & tongue, mmm Neck:  no LAD, masses or thyromegaly Cardiovascular:  RRR, no m/r/g. No LE edema.  Respiratory:  CTA bilaterally, no w/r/r. Normal respiratory effort. Abdomen:  soft, ntnd, NABS Skin:  no rash or induration seen on limited exam Musculoskeletal:  grossly normal tone  BUE/BLE, good ROM, no bony abnormality Psychiatric:  blunted mood and affect, speech fluent and appropriate, AOx3 Neurologic:  CN 2-12 grossly intact, moves all extremities in coordinated fashion, sensation intact  Labs on Admission: I have personally reviewed following labs and imaging studies  CBC:  Recent Labs Lab 09/27/16 2105  WBC 15.5*  NEUTROABS 12.5*  HGB 7.7*  HCT 23.6*  MCV 81.4  PLT 096   Basic Metabolic Panel:  Recent Labs Lab 09/27/16 2105  NA 133*  K 4.0  CL 100*  CO2 25  GLUCOSE 179*  BUN 17  CREATININE 1.04  CALCIUM 8.6*   GFR: CrCl  cannot be calculated (Unknown ideal weight.). Liver Function Tests:  Recent Labs Lab 09/27/16 2105  AST 47*  ALT 43  ALKPHOS 158*  BILITOT 0.6  PROT 6.8  ALBUMIN 2.5*   No results for input(s): LIPASE, AMYLASE in the last 168 hours. No results for input(s): AMMONIA in the last 168 hours. Coagulation Profile: No results for input(s): INR, PROTIME in the last 168 hours. Cardiac Enzymes: No results for input(s): CKTOTAL, CKMB, CKMBINDEX, TROPONINI in the last 168 hours. BNP (last 3 results) No results for input(s): PROBNP in the last 8760 hours. HbA1C: No results for input(s): HGBA1C in the last 72 hours. CBG: No results for input(s): GLUCAP in the last 168 hours. Lipid Profile: No results for input(s): CHOL, HDL, LDLCALC, TRIG, CHOLHDL, LDLDIRECT in the last 72 hours. Thyroid Function Tests: No results for input(s): TSH, T4TOTAL, FREET4, T3FREE, THYROIDAB in the last 72 hours. Anemia Panel: No results for input(s): VITAMINB12, FOLATE, FERRITIN, TIBC, IRON, RETICCTPCT in the last 72 hours. Urine analysis:    Component Value Date/Time   COLORURINE YELLOW 09/27/2016 2209   APPEARANCEUR CLEAR 09/27/2016 2209   LABSPEC 1.009 09/27/2016 2209   PHURINE 5.0 09/27/2016 2209   GLUCOSEU NEGATIVE 09/27/2016 2209   HGBUR NEGATIVE 09/27/2016 2209   BILIRUBINUR NEGATIVE 09/27/2016 2209   KETONESUR NEGATIVE  09/27/2016 2209   PROTEINUR NEGATIVE 09/27/2016 2209   NITRITE NEGATIVE 09/27/2016 2209   LEUKOCYTESUR NEGATIVE 09/27/2016 2209    Creatinine Clearance: CrCl cannot be calculated (Unknown ideal weight.).  Sepsis Labs: @LABRCNTIP (procalcitonin:4,lacticidven:4) )No results found for this or any previous visit (from the past 240 hour(s)).   Radiological Exams on Admission: Dg Chest 2 View  Result Date: 09/27/2016 CLINICAL DATA:  Subacute onset of worsening generalized weakness. Recently discovered liver mass. Initial encounter. EXAM: CHEST  2 VIEW COMPARISON:  Chest radiograph performed 07/25/2012 FINDINGS: The lungs are well-aerated and clear. There is no evidence of focal opacification, pleural effusion or pneumothorax. The heart is borderline normal in size. No acute osseous abnormalities are seen. IMPRESSION: No acute cardiopulmonary process seen. Electronically Signed   By: Garald Balding M.D.   On: 09/27/2016 21:33    EKG: Independently reviewed.  NSR with rate 79; abnormal R wave progression with no evidence of acute ischemia, NSCSLT  Assessment/Plan Principal Problem:   Sepsis ([place]) Active Problems:   Diabetes mellitus type 2, uncontrolled (HCC)   Liver mass   Anemia   Sepsis -Elevated WBC count (15.5), fever (101.6), with normal lactate  -While awaiting blood cultures, this appears to be a preseptic condition. -Sepsis protocol initiated -Suspect sepsis physiology is related to necrotic liver mass; CXR and UA WNL -Blood and urine cultures pending -Will admit to Med Surg and continue to monitor -Treat with IV Zosyn -Will check procalcitonin  Liver mass -Certainly, this mass by imaging is concerning for primary hepatic malignancy vs. Metastatic disease. -AP 158 -AST 47/ALT 43 -Patient will need to be queried about last colonoscopy and GI may need to be involved as inpatient vs. Outpatient. -However, initially it would be most helpful to obtain a biopsy of the liver  mass.  CCS has previously been consulted (first appt pending) and so it is reasonable to request inpatient consultation in the AM.  IR may be another valuable resource in this case. -Albumin 2.5 - will request nutrition consult -Patient is NPO and not on Lovenox in anticipation of need for possible procedure(s).  Anemia -Hgb 7.7, comparison from 2014 -Normocytic, may be anemia of chronic disease. -Will  follow for now, but if decreasing and/or if patient will need a more extensive surgery, transfusion may be reasonable. -Additionally, given that the patient's primary complaint is fatigue, he may feel better with transfusion if this is from symptomatic anemia.  Diabetes -Glucose 179 -A1c in 2014 was 7, will repeat -Cover with moderate scale SSI -Hold PO medication (patient previously prescribed Glucophage but he is not taking it)   DVT prophylaxis:  SCDs Code Status:  DNR - confirmed with patient Family Communication: None present Disposition Plan:  Home once clinically improved Consults called: Surgery in AM  Admission status: Admit - It is my clinical opinion that admission to INPATIENT is reasonable and necessary because this patient will require at least 2 midnights in the hospital to treat this condition based on the medical complexity of the problems presented.  Given the aforementioned information, the predictability of an adverse outcome is felt to be significant.    Karmen Bongo MD [facility]  If 7PM-7AM, please contact night-coverage [email or web address] Password [facility]  09/28/2016, 12:54 AM

## 2016-09-28 ENCOUNTER — Encounter (HOSPITAL_COMMUNITY): Payer: Self-pay | Admitting: Internal Medicine

## 2016-09-28 DIAGNOSIS — K651 Peritoneal abscess: Secondary | ICD-10-CM | POA: Diagnosis not present

## 2016-09-28 DIAGNOSIS — Z9689 Presence of other specified functional implants: Secondary | ICD-10-CM | POA: Diagnosis not present

## 2016-09-28 DIAGNOSIS — Z7982 Long term (current) use of aspirin: Secondary | ICD-10-CM | POA: Diagnosis not present

## 2016-09-28 DIAGNOSIS — Z7901 Long term (current) use of anticoagulants: Secondary | ICD-10-CM | POA: Diagnosis not present

## 2016-09-28 DIAGNOSIS — D649 Anemia, unspecified: Secondary | ICD-10-CM | POA: Diagnosis not present

## 2016-09-28 DIAGNOSIS — Z9079 Acquired absence of other genital organ(s): Secondary | ICD-10-CM | POA: Diagnosis not present

## 2016-09-28 DIAGNOSIS — A419 Sepsis, unspecified organism: Secondary | ICD-10-CM | POA: Diagnosis not present

## 2016-09-28 DIAGNOSIS — E871 Hypo-osmolality and hyponatremia: Secondary | ICD-10-CM | POA: Diagnosis present

## 2016-09-28 DIAGNOSIS — B9620 Unspecified Escherichia coli [E. coli] as the cause of diseases classified elsewhere: Secondary | ICD-10-CM | POA: Diagnosis not present

## 2016-09-28 DIAGNOSIS — B961 Klebsiella pneumoniae [K. pneumoniae] as the cause of diseases classified elsewhere: Secondary | ICD-10-CM | POA: Diagnosis present

## 2016-09-28 DIAGNOSIS — K5720 Diverticulitis of large intestine with perforation and abscess without bleeding: Secondary | ICD-10-CM | POA: Diagnosis present

## 2016-09-28 DIAGNOSIS — Z66 Do not resuscitate: Secondary | ICD-10-CM | POA: Diagnosis present

## 2016-09-28 DIAGNOSIS — E088 Diabetes mellitus due to underlying condition with unspecified complications: Secondary | ICD-10-CM | POA: Diagnosis not present

## 2016-09-28 DIAGNOSIS — Z95828 Presence of other vascular implants and grafts: Secondary | ICD-10-CM | POA: Diagnosis not present

## 2016-09-28 DIAGNOSIS — D509 Iron deficiency anemia, unspecified: Secondary | ICD-10-CM | POA: Diagnosis present

## 2016-09-28 DIAGNOSIS — K750 Abscess of liver: Secondary | ICD-10-CM | POA: Diagnosis not present

## 2016-09-28 DIAGNOSIS — Z7984 Long term (current) use of oral hypoglycemic drugs: Secondary | ICD-10-CM | POA: Diagnosis not present

## 2016-09-28 DIAGNOSIS — Z801 Family history of malignant neoplasm of trachea, bronchus and lung: Secondary | ICD-10-CM | POA: Diagnosis not present

## 2016-09-28 DIAGNOSIS — R509 Fever, unspecified: Secondary | ICD-10-CM | POA: Diagnosis not present

## 2016-09-28 DIAGNOSIS — E1165 Type 2 diabetes mellitus with hyperglycemia: Secondary | ICD-10-CM | POA: Diagnosis not present

## 2016-09-28 DIAGNOSIS — E43 Unspecified severe protein-calorie malnutrition: Secondary | ICD-10-CM | POA: Diagnosis present

## 2016-09-28 DIAGNOSIS — Z9889 Other specified postprocedural states: Secondary | ICD-10-CM | POA: Diagnosis not present

## 2016-09-28 DIAGNOSIS — K7689 Other specified diseases of liver: Secondary | ICD-10-CM | POA: Diagnosis not present

## 2016-09-28 DIAGNOSIS — Z794 Long term (current) use of insulin: Secondary | ICD-10-CM | POA: Diagnosis not present

## 2016-09-28 DIAGNOSIS — Z8546 Personal history of malignant neoplasm of prostate: Secondary | ICD-10-CM | POA: Diagnosis not present

## 2016-09-28 DIAGNOSIS — E119 Type 2 diabetes mellitus without complications: Secondary | ICD-10-CM | POA: Diagnosis not present

## 2016-09-28 DIAGNOSIS — B9689 Other specified bacterial agents as the cause of diseases classified elsewhere: Secondary | ICD-10-CM | POA: Diagnosis not present

## 2016-09-28 DIAGNOSIS — Z6822 Body mass index (BMI) 22.0-22.9, adult: Secondary | ICD-10-CM | POA: Diagnosis not present

## 2016-09-28 DIAGNOSIS — E876 Hypokalemia: Secondary | ICD-10-CM | POA: Diagnosis present

## 2016-09-28 DIAGNOSIS — R160 Hepatomegaly, not elsewhere classified: Secondary | ICD-10-CM | POA: Diagnosis not present

## 2016-09-28 DIAGNOSIS — K5732 Diverticulitis of large intestine without perforation or abscess without bleeding: Secondary | ICD-10-CM | POA: Diagnosis not present

## 2016-09-28 LAB — BASIC METABOLIC PANEL
Anion gap: 9 (ref 5–15)
BUN: 16 mg/dL (ref 6–20)
CALCIUM: 8.3 mg/dL — AB (ref 8.9–10.3)
CHLORIDE: 102 mmol/L (ref 101–111)
CO2: 23 mmol/L (ref 22–32)
CREATININE: 1.04 mg/dL (ref 0.61–1.24)
GFR calc non Af Amer: >60 mL/min (ref >60)
Glucose, Bld: 196 mg/dL — ABNORMAL HIGH (ref 65–99)
Potassium: 4.1 mmol/L (ref 3.5–5.1)
SODIUM: 134 mmol/L — AB (ref 135–145)

## 2016-09-28 LAB — APTT: aPTT: 40 seconds — ABNORMAL HIGH (ref 24–36)

## 2016-09-28 LAB — CBC
HCT: 25.7 % — ABNORMAL LOW (ref 39.0–52.0)
Hemoglobin: 8.3 g/dL — ABNORMAL LOW (ref 13.0–17.0)
MCH: 26.5 pg (ref 26.0–34.0)
MCHC: 32.3 g/dL (ref 30.0–36.0)
MCV: 82.1 fL (ref 78.0–100.0)
PLATELETS: 331 10*3/uL (ref 150–400)
RBC: 3.13 MIL/uL — AB (ref 4.22–5.81)
RDW: 13.3 % (ref 11.5–15.5)
WBC: 15.9 10*3/uL — AB (ref 4.0–10.5)

## 2016-09-28 LAB — GLUCOSE, CAPILLARY
GLUCOSE-CAPILLARY: 132 mg/dL — AB (ref 65–99)
Glucose-Capillary: 189 mg/dL — ABNORMAL HIGH (ref 65–99)
Glucose-Capillary: 164 mg/dL — ABNORMAL HIGH (ref 65–99)
Glucose-Capillary: 113 mg/dL — ABNORMAL HIGH (ref 65–99)
Glucose-Capillary: 103 mg/dL — ABNORMAL HIGH (ref 65–99)

## 2016-09-28 LAB — PROTIME-INR
INR: 1.38
PROTHROMBIN TIME: 17.1 s — AB (ref 11.4–15.2)

## 2016-09-28 LAB — PROCALCITONIN: Procalcitonin: 0.86 ng/mL

## 2016-09-28 MED ORDER — ASPIRIN EC 81 MG PO TBEC
81.0000 mg | DELAYED_RELEASE_TABLET | Freq: Every day | ORAL | Status: DC
  Administered 2016-09-29 – 2016-10-04 (×6): 81 mg via ORAL
  Filled 2016-09-28 (×6): qty 1

## 2016-09-28 MED ORDER — ACETAMINOPHEN 650 MG RE SUPP: 650.0000 mg | Freq: Four times a day (QID) | RECTAL | Status: DC | PRN

## 2016-09-28 MED ORDER — SODIUM CHLORIDE 0.9 % IV SOLN
INTRAVENOUS | Status: DC
  Administered 2016-09-28 – 2016-10-02 (×4): via INTRAVENOUS
  Administered 2016-10-02: 1000 mL via INTRAVENOUS
  Administered 2016-10-03: 10:00:00 via INTRAVENOUS

## 2016-09-28 MED ORDER — INSULIN ASPART 100 UNIT/ML ~~LOC~~ SOLN
0.0000 [IU] | Freq: Three times a day (TID) | SUBCUTANEOUS | Status: DC
  Administered 2016-09-28: 3 [IU] via SUBCUTANEOUS
  Administered 2016-09-29: 2 [IU] via SUBCUTANEOUS
  Administered 2016-09-30 – 2016-10-01 (×2): 3 [IU] via SUBCUTANEOUS
  Administered 2016-10-01 – 2016-10-03 (×2): 2 [IU] via SUBCUTANEOUS

## 2016-09-28 MED ORDER — DOCUSATE SODIUM 100 MG PO CAPS
100.0000 mg | ORAL_CAPSULE | Freq: Two times a day (BID) | ORAL | Status: DC
  Administered 2016-09-29 – 2016-10-04 (×11): 100 mg via ORAL
  Filled 2016-09-28 (×11): qty 1

## 2016-09-28 MED ORDER — ONDANSETRON HCL 4 MG PO TABS: 4.0000 mg | ORAL_TABLET | Freq: Four times a day (QID) | ORAL | Status: DC | PRN

## 2016-09-28 MED ORDER — ACETAMINOPHEN 325 MG PO TABS
650.0000 mg | ORAL_TABLET | Freq: Four times a day (QID) | ORAL | Status: DC | PRN
  Administered 2016-09-28: 650 mg via ORAL
  Filled 2016-09-28: qty 2

## 2016-09-28 MED ORDER — ONDANSETRON HCL 4 MG/2ML IJ SOLN: 4.0000 mg | Freq: Four times a day (QID) | INTRAMUSCULAR | Status: DC | PRN

## 2016-09-28 MED ORDER — INSULIN ASPART 100 UNIT/ML ~~LOC~~ SOLN: 0.0000 [IU] | Freq: Every day | SUBCUTANEOUS | Status: DC

## 2016-09-28 MED ORDER — PIPERACILLIN-TAZOBACTAM 3.375 G IVPB
3.3750 g | Freq: Three times a day (TID) | INTRAVENOUS | Status: DC
  Administered 2016-09-28 – 2016-09-30 (×7): 3.375 g via INTRAVENOUS
  Filled 2016-09-28 (×8): qty 50

## 2016-09-28 MED ORDER — PSYLLIUM 95 % PO PACK
1.0000 | PACK | Freq: Every day | ORAL | Status: DC
  Administered 2016-09-30 – 2016-10-03 (×3): 1 via ORAL
  Filled 2016-09-28 (×4): qty 1

## 2016-09-28 MED ORDER — FERROUS SULFATE 325 (65 FE) MG PO TABS
325.0000 mg | ORAL_TABLET | Freq: Two times a day (BID) | ORAL | Status: DC
  Administered 2016-09-28 – 2016-10-04 (×11): 325 mg via ORAL
  Filled 2016-09-28 (×11): qty 1

## 2016-09-28 NOTE — Progress Notes (Signed)
Pharmacy Antibiotic Note  Billy Horn is a 71 y.o. male c/o fatigue and fever admitted on 09/27/2016 with intra-abdominal infection.  Pharmacy has been consulted for zosyn dosing.  Plan: Zosyn 3.375g IV q8h (4 hour infusion).  Height: 6' (182.9 cm) Weight: 168 lb 3.4 oz (76.3 kg) IBW/kg (Calculated) : 77.6  Temp (24hrs), Avg:100.9 F (38.3 C), Min:100.1 F (37.8 C), Max:101.6 F (38.7 C)   Recent Labs Lab 09/27/16 2105 09/27/16 2115  WBC 15.5*  --   CREATININE 1.04  --   LATICACIDVEN  --  0.70    Estimated Creatinine Clearance: 70.3 mL/min (by C-G formula based on SCr of 1.04 mg/dL).    No Known Allergies  Antimicrobials this admission: 6/10 zoysn >>    >>   Dose adjustments this admission:   Microbiology results:  BCx:   UCx:    Sputum:    MRSA PCR:   Thank you for allowing pharmacy to be a part of this patient's care.  Dorrene German 09/28/2016 2:03 AM

## 2016-09-28 NOTE — Consult Note (Signed)
Reason for Consult:  Liver Mass with fever Referring Physician:  Dr. Rudolpho Sevin PCP:  Alroy Dust, L.Marlou Sa, MD CC:  Fever/fatigue and liver mass, found on 09/18/16 MRI  Billy Horn is an 71 y.o. male.   HPI: Pt presented to his PCP 09/04/16, with decreased energy, decreased appetite, and nausea.with some vomiting.  On  that day only he had a fever of 103 when he saw Dr. Alroy Dust.  He was placed on Ampicillin at the   He was sent for abd ultrasound on  09/11/16; this revealed a 9.0 x 4.9 x 8.9 cm left hepatic lobe mass. His fever subsided with the antibiotics.  He believes he had about 8 days worth of antibiotics from his original evaluation on 09/04/16.   He was then sent for an MRI on 09/18/16 revealing: 9 x 7 x 10 mm mass largely replaces the lateral segment of the left liver and extends inferiorly. Lesion has a large central area of nonenhancement likely related to degeneration or necrosis.  Several satellite nodules are evident. Imaging features remain highly concerning for neoplasm. Given the extension below the liver, abscess is considered less likely.  3 cm retrocaval lymph node/soft tissue nodule, concerning for metastatic disease. He has been referred to Dr. Barry Dienes, but has not seen her so far.  Since his study he has continued to feel weak, and unable to do daily chores. His fever had  resolved until yesterday, it reoccurred.  He says he was stable until yesterday when he just felt terrible, to weak to do anything except lie in bed.  So with the return of fever and increased weakness, he came to the ED for evaluation.  He estimated he has lost weight in the "teens," since the onset of symptoms about 1 month ago.   Yesterday he was seen in the ED and admitted with fevers up to 102.9.He reports fatigue, and progressive weakness, feeling bad and unable to perform daily task.  He was unsure of the exact weight loss.  He is normally very healthy and this is very unusual for him.   Work up in the ED  shows his fever at 101.6, BP and heart rate are stable.  WBC is 15.5, H/H 7.7/23.6, platelets 373. Alk phos up 158, AST 47, ALT 47, T bil 0.6, lactate 0.7, procalcitonin is 0.86.  INR this AM 1.38, UA is negative.  We are ask to see.  He had been scheduled to see DR. Byerly., AFP and A1c are pending.  Past Medical History:  Diagnosis Date  . Prostate cancer, with radical prostatectomy 04/06/11  2012     . Diabetes mellitus    controlled with diet and exercise    Past Surgical History:  Procedure Laterality Date  . ORIF TIBIA PLATEAU Left 07/23/2012   Procedure: OPEN REDUCTION INTERNAL FIXATION (ORIF) TIBIAL PLATEAU;  Surgeon: Sharmon Revere, MD;  Location: [facility];  Service: Orthopedics;  Laterality: Left;  . ROBOT ASSISTED LAPAROSCOPIC RADICAL PROSTATECTOMY  04/06/2011   Procedure: ROBOTIC ASSISTED LAPAROSCOPIC RADICAL PROSTATECTOMY LEVEL 2;  Surgeon: Dutch Gray, MD;  Location: [facility];  Service: Urology;  Laterality: N/A;  Bilateral Pelvic Lymphadenectomy   . TONSILLECTOMY     as a child    Family History  Problem Relation Age of Onset  . Lung cancer Mother 54    Social History:  reports that he has never smoked. He has never used smokeless tobacco. He reports that he drinks alcohol. He reports that he does not use  drugs.  Allergies: No Known Allergies Tobacco:  None Drugs:  None Etoh:  Social   He lives alone, he report church family is his only family.   Works in Building control surveyor.  Medications:  Prior to Admission:  Prescriptions Prior to Admission  Medication Sig Dispense Refill Last Dose  . acetaminophen (TYLENOL) 325 MG tablet Take 1-2 tablets (325-650 mg total) by mouth every 4 (four) hours as needed. (Patient taking differently: Take 325-650 mg by mouth every 4 (four) hours as needed for mild pain, moderate pain or headache. )   09/27/2016 at Unknown time  . aspirin EC 81 MG tablet Take 81 mg by mouth daily after breakfast.   09/27/2016 at 1100  .  Calcium Carbonate (CALCIUM-CARB 600 PO) Take 1 tablet by mouth daily after breakfast.   09/27/2016 at Unknown time  . meloxicam (MOBIC) 15 MG tablet Take 15 mg by mouth daily after breakfast. with food  2 09/27/2016 at Unknown time  . Multiple Vitamin (MULTIVITAMIN WITH MINERALS) TABS Take 1 tablet by mouth daily after breakfast.    09/27/2016 at Unknown time  . psyllium (METAMUCIL) 0.52 G capsule Take 1.04 g by mouth daily after breakfast.    09/27/2016 at Unknown time  . ferrous sulfate 325 (65 FE) MG tablet Take 1 tablet (325 mg total) by mouth 2 (two) times daily with a meal. For anemia (Patient not taking: Reported on 09/27/2016) 60 tablet 0 Not Taking at Unknown time  . losartan (COZAAR) 50 MG tablet Take 1 tablet (50 mg total) by mouth daily. (Patient not taking: Reported on 09/27/2016) 30 tablet 1 Not Taking at Unknown time  . metFORMIN (GLUCOPHAGE) 500 MG tablet Take 1 tablet (500 mg total) by mouth daily with breakfast. For diabetes (Patient not taking: Reported on 09/27/2016) 30 tablet 1 Not Taking at Unknown time    Results for orders placed or performed during the hospital encounter of 09/27/16 (from the past 48 hour(s))  Comprehensive metabolic panel     Status: Abnormal   Collection Time: 09/27/16  9:05 PM  Result Value Ref Range   Sodium 133 (L) 135 - 145 mmol/L   Potassium 4.0 3.5 - 5.1 mmol/L   Chloride 100 (L) 101 - 111 mmol/L   CO2 25 22 - 32 mmol/L   Glucose, Bld 179 (H) 65 - 99 mg/dL   BUN 17 6 - 20 mg/dL   Creatinine, Ser 1.04 0.61 - 1.24 mg/dL   Calcium 8.6 (L) 8.9 - 10.3 mg/dL   Total Protein 6.8 6.5 - 8.1 g/dL   Albumin 2.5 (L) 3.5 - 5.0 g/dL   AST 47 (H) 15 - 41 U/L   ALT 43 17 - 63 U/L   Alkaline Phosphatase 158 (H) 38 - 126 U/L   Total Bilirubin 0.6 0.3 - 1.2 mg/dL   GFR calc non Af Amer >60 >60 mL/min   GFR calc Af Amer >60 >60 mL/min    Comment: (NOTE) The eGFR has been calculated using the CKD EPI equation. This calculation has not been validated in all  clinical situations. eGFR's persistently <60 mL/min signify possible Chronic Kidney Disease.    Anion gap 8 5 - 15  CBC WITH DIFFERENTIAL     Status: Abnormal   Collection Time: 09/27/16  9:05 PM  Result Value Ref Range   WBC 15.5 (H) 4.0 - 10.5 K/uL   RBC 2.90 (L) 4.22 - 5.81 MIL/uL   Hemoglobin 7.7 (L) 13.0 - 17.0 g/dL  HCT 23.6 (L) 39.0 - 52.0 %   MCV 81.4 78.0 - 100.0 fL   MCH 26.6 26.0 - 34.0 pg   MCHC 32.6 30.0 - 36.0 g/dL   RDW 13.1 11.5 - 15.5 %   Platelets 373 150 - 400 K/uL   Neutrophils Relative % 81 %   Neutro Abs 12.5 (H) 1.7 - 7.7 K/uL   Lymphocytes Relative 11 %   Lymphs Abs 1.7 0.7 - 4.0 K/uL   Monocytes Relative 8 %   Monocytes Absolute 1.3 (H) 0.1 - 1.0 K/uL   Eosinophils Relative 0 %   Eosinophils Absolute 0.0 0.0 - 0.7 K/uL   Basophils Relative 0 %   Basophils Absolute 0.0 0.0 - 0.1 K/uL  I-Stat CG4 Lactic Acid, ED  (not at  [facility])     Status: None   Collection Time: 09/27/16  9:15 PM  Result Value Ref Range   Lactic Acid, Venous 0.70 0.5 - 1.9 mmol/L  Urinalysis, Routine w reflex microscopic     Status: None   Collection Time: 09/27/16 10:09 PM  Result Value Ref Range   Color, Urine YELLOW YELLOW   APPearance CLEAR CLEAR   Specific Gravity, Urine 1.009 1.005 - 1.030   pH 5.0 5.0 - 8.0   Glucose, UA NEGATIVE NEGATIVE mg/dL   Hgb urine dipstick NEGATIVE NEGATIVE   Bilirubin Urine NEGATIVE NEGATIVE   Ketones, ur NEGATIVE NEGATIVE mg/dL   Protein, ur NEGATIVE NEGATIVE mg/dL   Nitrite NEGATIVE NEGATIVE   Leukocytes, UA NEGATIVE NEGATIVE  Procalcitonin     Status: None   Collection Time: 09/28/16  1:55 AM  Result Value Ref Range   Procalcitonin 0.86 ng/mL    Comment:        Interpretation: PCT > 0.5 ng/mL and <= 2 ng/mL: Systemic infection (sepsis) is possible, but other conditions are known to elevate PCT as well. (NOTE)         ICU PCT Algorithm               Non ICU PCT Algorithm    ----------------------------      ------------------------------         PCT < 0.25 ng/mL                 PCT < 0.1 ng/mL     Stopping of antibiotics            Stopping of antibiotics       strongly encouraged.               strongly encouraged.    ----------------------------     ------------------------------       PCT level decrease by               PCT < 0.25 ng/mL       >= 80% from peak PCT       OR PCT 0.25 - 0.5 ng/mL          Stopping of antibiotics                                             encouraged.     Stopping of antibiotics           encouraged.    ----------------------------     ------------------------------       PCT level decrease by  PCT >= 0.25 ng/mL       < 80% from peak PCT        AND PCT >= 0.5 ng/mL             Continuing antibiotics                                              encouraged.       Continuing antibiotics            encouraged.    ----------------------------     ------------------------------     PCT level increase compared          PCT > 0.5 ng/mL         with peak PCT AND          PCT >= 0.5 ng/mL             Escalation of antibiotics                                          strongly encouraged.      Escalation of antibiotics        strongly encouraged.   Protime-INR     Status: Abnormal   Collection Time: 09/28/16  1:55 AM  Result Value Ref Range   Prothrombin Time 17.1 (H) 11.4 - 15.2 seconds   INR 1.38   APTT     Status: Abnormal   Collection Time: 09/28/16  1:55 AM  Result Value Ref Range   aPTT 40 (H) 24 - 36 seconds    Comment:        IF BASELINE aPTT IS ELEVATED, SUGGEST PATIENT RISK ASSESSMENT BE USED TO DETERMINE APPROPRIATE ANTICOAGULANT THERAPY.   Glucose, capillary     Status: Abnormal   Collection Time: 09/28/16  4:03 AM  Result Value Ref Range   Glucose-Capillary 189 (H) 65 - 99 mg/dL   Comment 1 Notify RN    Comment 2 Document in Chart   Basic metabolic panel     Status: Abnormal   Collection Time: 09/28/16  4:35 AM  Result Value  Ref Range   Sodium 134 (L) 135 - 145 mmol/L   Potassium 4.1 3.5 - 5.1 mmol/L   Chloride 102 101 - 111 mmol/L   CO2 23 22 - 32 mmol/L   Glucose, Bld 196 (H) 65 - 99 mg/dL   BUN 16 6 - 20 mg/dL   Creatinine, Ser 1.04 0.61 - 1.24 mg/dL   Calcium 8.3 (L) 8.9 - 10.3 mg/dL   GFR calc non Af Amer >60 >60 mL/min   GFR calc Af Amer >60 >60 mL/min    Comment: (NOTE) The eGFR has been calculated using the CKD EPI equation. This calculation has not been validated in all clinical situations. eGFR's persistently <60 mL/min signify possible Chronic Kidney Disease.    Anion gap 9 5 - 15  CBC     Status: Abnormal   Collection Time: 09/28/16  4:35 AM  Result Value Ref Range   WBC 15.9 (H) 4.0 - 10.5 K/uL   RBC 3.13 (L) 4.22 - 5.81 MIL/uL   Hemoglobin 8.3 (L) 13.0 - 17.0 g/dL   HCT 25.7 (L) 39.0 - 52.0 %   MCV 82.1 78.0 - 100.0 fL  MCH 26.5 26.0 - 34.0 pg   MCHC 32.3 30.0 - 36.0 g/dL   RDW 13.3 11.5 - 15.5 %   Platelets 331 150 - 400 K/uL    Dg Chest 2 View  Result Date: 09/27/2016 CLINICAL DATA:  Subacute onset of worsening generalized weakness. Recently discovered liver mass. Initial encounter. EXAM: CHEST  2 VIEW COMPARISON:  Chest radiograph performed 07/25/2012 FINDINGS: The lungs are well-aerated and clear. There is no evidence of focal opacification, pleural effusion or pneumothorax. The heart is borderline normal in size. No acute osseous abnormalities are seen. IMPRESSION: No acute cardiopulmonary process seen. Electronically Signed   By: Garald Balding M.D.   On: 09/27/2016 21:33    Review of Systems  Constitutional: Positive for fever, malaise/fatigue and weight loss (reports loss of weight in the teens over the last 4 weeks). Negative for chills.  HENT: Negative.   Eyes: Negative.   Respiratory: Negative.   Cardiovascular: Negative.   Gastrointestinal: Negative.        He had one episode of nausea and vomiting the day he saw Dr. Alroy Dust, back in May.   Genitourinary: Negative.    Musculoskeletal: Negative.   Skin: Negative.   Neurological: Positive for weakness.  Endo/Heme/Allergies: Negative.   Psychiatric/Behavioral: Negative.    Blood pressure (!) 119/58, pulse 67, temperature 98.2 F (36.8 C), temperature source Oral, resp. rate 20, height 6' (1.829 m), weight 76.3 kg (168 lb 3.4 oz), SpO2 99 %. Physical Exam  Constitutional: He is oriented to person, place, and time. He appears well-developed and well-nourished. No distress.  HENT:  Head: Normocephalic and atraumatic.  Mouth/Throat: Oropharynx is clear and moist. No oropharyngeal exudate.  Eyes: Right eye exhibits no discharge. Left eye exhibits no discharge. No scleral icterus.  Pupils are equal  Neck: Normal range of motion. Neck supple. No JVD present. No tracheal deviation present. No thyromegaly present.  Cardiovascular: Normal rate, regular rhythm, normal heart sounds and intact distal pulses.   No murmur heard. Respiratory: Effort normal and breath sounds normal. No respiratory distress. He has no wheezes. He has no rales. He exhibits no tenderness.  GI: Soft. Bowel sounds are normal. He exhibits no distension and no mass. There is no tenderness (He may have some tenderness over the liver, but he is heistant to call it that.). There is no rebound and no guarding.  Liver percussed below the CM about 3 cm  Musculoskeletal: He exhibits no edema or tenderness.  Lymphadenopathy:    He has no cervical adenopathy.  Neurological: He is alert and oriented to person, place, and time. No cranial nerve deficit.  Skin: Skin is warm and dry. No rash noted. No erythema. No pallor.  Psychiatric: He has a normal mood and affect. His behavior is normal. Judgment and thought content normal.    Assessment/Plan: Liver Mass 9 x 7 x 10 mm, left lobe Fever Weight loss Hx of prostate cancer with radical prostatectomy, - Dr. Alinda Money, negative PSA hx Type II diabetes - diet/exercise controlled.  Plan:  Labs are  pending, I will discuss with Dr. Hassell Done and Dr. Barry Dienes knows he was referred but she has not seen him. Will review and follow with you.  CEA, and AFP are pending.  Discussed with Dr. Hassell Done, will ask IR to obtain liver bx.    Neera Teng 09/28/2016, 8:01 AM

## 2016-09-28 NOTE — Progress Notes (Signed)
Initial Nutrition Assessment  DOCUMENTATION CODES:   Severe malnutrition in context of acute illness/injury  INTERVENTION:   Diet advancement per MD Once diet advanced, provide [employer] Breakfast drinks TID.  RD will continue to monitor for plan  NUTRITION DIAGNOSIS:   Malnutrition (Severe) related to acute illness, nausea, vomiting as evidenced by percent weight loss, energy intake < or equal to 50% for > or equal to 5 days.  GOAL:   Patient will meet greater than or equal to 90% of their needs  MONITOR:   Diet advancement, Labs, Weight trends, I & O's  REASON FOR ASSESSMENT:   Consult Assessment of nutrition requirement/status  ASSESSMENT:   71 y.o. male with medical history significant of remote prostate CA and diabetes (not taking medications despite A1c of 7 - prefers diet and exercise) with newly recognized 9 x 7 x 10 cm lass largely replacing the lateral segment of the left liver and extending inferiorly with a large central area of probable necrosis, with several satellite nodules highly concerning for neoplasm and with 3 cm retrocaval LN concerning for metastatic disease (diagnosed by MRI 6/1).  He has been "on about a three week siege with fatigue and high temperatures."  Saw PCP and fever was 103.  He was started on  antibiotics (Ampicillin).  He was then scheduled for a CT and then MRI a week ago Friday.  Results last week showed a tumor on his liver.  He is awaiting additional testing and to meet with Dr. Barry Dienes in all probability for surgery.  Occasionally checks temperatures at home, 102 today.  He has extreme fatigue.  Today it was the worst of his 3 weeks and he just felt like he had to come in.  He is unable to complete normal day-to-day tasks.  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  He has to lie down after being up 30 minutes, just exhausted.  No SOB.  No pain.  Weight loss, maybe 20 pounds in the last 3 weeks.  Dr.  Alroy Dust thinks the tumor is cancerous but he needs more testing before surgery.  Patient in room with no family at bedside. Pt reports he has had poor intake and appetite since 5/15. Pt has been drinking liquids but has not been able to tolerate solid food well. Pt has tried to drink [employer] drinks at home during this time. Currently pt is NPO. Pt eager for water or ice chips.  Pt reports UBW is 188 lb, last weighed this in May. Pt has lost 11% of his UBW in 4 weeks which is significant for time frame. No depletion noted in face, unable to perform full NFPE at this time. Will attempt at follow-up.  Medications: Ferrous sulfate tablet BID Labs reviewed: CBGs: 164-189 Low Na  Diet Order:  Diet NPO time specified Diet NPO time specified  Skin:  Reviewed, no issues  Last BM:  6/9  Height:   Ht Readings from Last 1 Encounters:  09/28/16 6' (1.829 m)    Weight:   Wt Readings from Last 1 Encounters:  09/28/16 168 lb 3.4 oz (76.3 kg)    Ideal Body Weight:  80.9 kg  BMI:  Body mass index is 22.81 kg/m.  Estimated Nutritional Needs:   Kcal:  1900-2100  Protein:  85-95g  Fluid:  2L/day  EDUCATION NEEDS:   No education needs identified at this time  Clayton Bibles, MS, RD, LDN Pager: [phone] After  Hours Pager: [phone]

## 2016-09-28 NOTE — Progress Notes (Addendum)
PROGRESS NOTE    Billy Horn  YHC:[id number] DOB: 11/22/1945 DOA: 09/27/2016 PCP: Alroy Dust, L.Marlou Sa, MD   Brief Narrative: Billy Horn is a 71 y.o. male with medical history significant of remote prostate CA and diabetes (not taking medications despite A1c of 7 - prefers diet and exercise) with newly recognized 9 x 7 x 10 cm lass largely replacing the lateral segment of the left liver and extending inferiorly with a large central area of probable necrosis, with several satellite nodules highly concerning for neoplasm and with 3 cm retrocaval LN concerning for metastatic disease (diagnosed by MRI 6/1).  He has been "on about a three week siege with fatigue and high temperatures."  Saw PCP and fever was 103.  He was started on  antibiotics (Ampicillin).  He was then scheduled for a CT and then MRI a week ago Friday.  Results last week showed a tumor on his liver.  He is awaiting additional testing and to meet with Dr. Barry Dienes in all probability for surgery.  Occasionally checks temperatures at home, 102 today.  He has extreme fatigue.  Today it was the worst of his 3 weeks and he just felt like he had to come in.  He is unable to complete normal day-to-day tasks.  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  He has to lie down after being up 30 minutes, just exhausted.  No SOB.  No pain.  Weight loss, maybe 20 pounds in the last 3 weeks.  Dr. Alroy Dust thinks the tumor is cancerous but he needs more testing before surgery.  H/o prostate CA surgery in 2012.  [place] and broke his tibia the next year with surgery (2014).  Other than that, he reports no real medical issues.    Assessment & Plan:   Principal Problem:   Sepsis ([place]) Active Problems:   Diabetes mellitus type 2, uncontrolled (HCC)   Liver mass   Anemia   Liver mass, fever;  MRI ; Hepatobiliary: 9.1 x 7.0 x 10.2 cm mass is identified in the lateral segment left liver and extending inferiorly. Lesion  has an irregular large central area of nonenhancement with areas of peripheral rim enhancement and altered perfusion through much of the lateral segment left liver. Multiple small satellite lesions are associated. -Blood culture ordered.  -IV zosyn to cover for infection.  -Surgery consulted.  -Alfa-fetoprotein ordered. Hepatitis panel.Marland Kitchen   2-Anemia;  Check anemia panel/  On oral iron/   Sepsis;  Could be related to number one.  IV fluids.  IV antibiotics.  Follow blood culture.   Malnutrition;  Nutrition consulted.   DM;  SSI.   Hyponatremia; IV fluids.   DVT prophylaxis: scd.  Code Status: dnr Family Communication: care discussed with patient.  Disposition Plan: remain in the hospital.   Consultants:   Surgery    Procedures:      Antimicrobials:   IV zosyn 6-10   Subjective: He report fever for 3 weeks.  Weight loss of 18 pounds in a month.  He report night sweat, fatigue.  Drinks alcohol socially, rarely.   No recent travel out of country   Objective: Vitals:   09/28/16 0100 09/28/16 0149 09/28/16 0311 09/28/16 0633  BP: (!) 156/76 (!) 166/71  (!) 119/58  Pulse: 88 92  67  Resp: (!) 21 20  20   Temp:  (!) 101.1 F (38.4 C) 99.6 F (37.6 C) 98.2 F (36.8 C)  TempSrc:  Oral  Oral  SpO2: 97% 100%  99%  Weight:  76.3 kg (168 lb 3.4 oz)    Height:  6' (1.829 m)      Intake/Output Summary (Last 24 hours) at 09/28/16 0854 Last data filed at 09/28/16 0800  Gross per 24 hour  Intake            517.5 ml  Output                0 ml  Net            517.5 ml   Filed Weights   09/28/16 0149  Weight: 76.3 kg (168 lb 3.4 oz)    Examination:  General exam: Appears calm and comfortable  Respiratory system: Clear to auscultation. Respiratory effort normal. Cardiovascular system: S1 & S2 heard, RRR. No JVD, murmurs, rubs, gallops or clicks. No pedal edema. Gastrointestinal system: Abdomen is nondistended, soft and nontender. No organomegaly or  masses felt. Normal bowel sounds heard. Central nervous system: Alert and oriented. No focal neurological deficits. Extremities: Symmetric 5 x 5 power. Skin: No rashes, lesions or ulcers Psychiatry: Judgement and insight appear normal. Mood & affect appropriate.     Data Reviewed: I have personally reviewed following labs and imaging studies  CBC:  Recent Labs Lab 09/27/16 2105 09/28/16 0435  WBC 15.5* 15.9*  NEUTROABS 12.5*  --   HGB 7.7* 8.3*  HCT 23.6* 25.7*  MCV 81.4 82.1  PLT 373 510   Basic Metabolic Panel:  Recent Labs Lab 09/27/16 2105 09/28/16 0435  NA 133* 134*  K 4.0 4.1  CL 100* 102  CO2 25 23  GLUCOSE 179* 196*  BUN 17 16  CREATININE 1.04 1.04  CALCIUM 8.6* 8.3*   GFR: Estimated Creatinine Clearance: 70.3 mL/min (by C-G formula based on SCr of 1.04 mg/dL). Liver Function Tests:  Recent Labs Lab 09/27/16 2105  AST 47*  ALT 43  ALKPHOS 158*  BILITOT 0.6  PROT 6.8  ALBUMIN 2.5*   No results for input(s): LIPASE, AMYLASE in the last 168 hours. No results for input(s): AMMONIA in the last 168 hours. Coagulation Profile:  Recent Labs Lab 09/28/16 0155  INR 1.38   Cardiac Enzymes: No results for input(s): CKTOTAL, CKMB, CKMBINDEX, TROPONINI in the last 168 hours. BNP (last 3 results) No results for input(s): PROBNP in the last 8760 hours. HbA1C: No results for input(s): HGBA1C in the last 72 hours. CBG:  Recent Labs Lab 09/28/16 0403 09/28/16 0801  GLUCAP 189* 164*   Lipid Profile: No results for input(s): CHOL, HDL, LDLCALC, TRIG, CHOLHDL, LDLDIRECT in the last 72 hours. Thyroid Function Tests: No results for input(s): TSH, T4TOTAL, FREET4, T3FREE, THYROIDAB in the last 72 hours. Anemia Panel: No results for input(s): VITAMINB12, FOLATE, FERRITIN, TIBC, IRON, RETICCTPCT in the last 72 hours. Sepsis Labs:  Recent Labs Lab 09/27/16 2115 09/28/16 0155  PROCALCITON  --  0.86  LATICACIDVEN 0.70  --     No results found for  this or any previous visit (from the past 240 hour(s)).       Radiology Studies: Dg Chest 2 View  Result Date: 09/27/2016 CLINICAL DATA:  Subacute onset of worsening generalized weakness. Recently discovered liver mass. Initial encounter. EXAM: CHEST  2 VIEW COMPARISON:  Chest radiograph performed 07/25/2012 FINDINGS: The lungs are well-aerated and clear. There is no evidence of focal opacification, pleural effusion or pneumothorax. The heart is borderline normal in size. No acute osseous abnormalities are seen. IMPRESSION: No acute cardiopulmonary process  seen. Electronically Signed   By: Garald Balding M.D.   On: 09/27/2016 21:33        Scheduled Meds: . aspirin EC  81 mg Oral QPC breakfast  . docusate sodium  100 mg Oral BID  . ferrous sulfate  325 mg Oral BID WC  . insulin aspart  0-15 Units Subcutaneous TID WC  . insulin aspart  0-5 Units Subcutaneous QHS  . psyllium  1 packet Oral QPC breakfast   Continuous Infusions: . sodium chloride 75 mL/hr at 09/28/16 0146  . piperacillin-tazobactam (ZOSYN)  IV 3.375 g (09/28/16 0554)     LOS: 0 days    Time spent: 35 minutes.     Elmarie Shiley, MD [facility] Pager [phone]  If 7PM-7AM, please contact night-coverage [email or web address] Password [facility] 09/28/2016, 8:54 AM

## 2016-09-28 NOTE — Consult Note (Signed)
Chief Complaint: Patient was seen in consultation today for image guided liver cystic mass biopsy/aspiration/possible drainage Chief Complaint  Patient presents with  . Fever  . Fatigue    Referring Physician(s): Martin,M  Supervising Physician: Owens Shark  Patient Status: Dirk Dress IP  History of Present Illness: Billy Horn is a 71 y.o. male with PMH sig for DM,prostate cancer (prostatectomy 2012) who was admitted yesterday with worsening fatigue, weakness, weight loss, sweats, fever, nausea, leukocytosis and elevated LFT's. He recently completed course of OP anitbiotic therapy. Prior US abd noted large mass lesion in left hepatic lobe worrisome for malignancy. Subsequent MRI abd on 09/18/16 revealed  1. 9 x 7 x 10 mm mass largely replaces the lateral segment of the left liver and extends inferiorly. Lesion has a large central area of nonenhancement likely related to degeneration or necrosis. Several satellite nodules are evident. Imaging features remain highly concerning for neoplasm. Given the extension below the liver, abscess is considered less likely. 2. 3 cm retrocaval lymph node/soft tissue nodule, concerning for metastatic disease.  He was scheduled to follow up with [facility] but presented to ED yesterday with above symptoms.Request now received for image guided biopsy/aspiration/possible drainage of the above noted left hepatic lobe cystic mass. Past Medical History:  Diagnosis Date  . Cancer [facility]) 2012   prostate cancer  . Diabetes mellitus    controlled with diet and exercise    Past Surgical History:  Procedure Laterality Date  . ORIF TIBIA PLATEAU Left 07/23/2012   Procedure: OPEN REDUCTION INTERNAL FIXATION (ORIF) TIBIAL PLATEAU;  Surgeon: Sharmon Revere, MD;  Location: [facility];  Service: Orthopedics;  Laterality: Left;  . ROBOT ASSISTED LAPAROSCOPIC RADICAL PROSTATECTOMY  04/06/2011   Procedure: ROBOTIC ASSISTED LAPAROSCOPIC RADICAL PROSTATECTOMY LEVEL 2;  Surgeon: Dutch Gray, MD;  Location: [facility];  Service: Urology;  Laterality: N/A;  Bilateral Pelvic Lymphadenectomy   . TONSILLECTOMY     as a child    Allergies: Patient has no known allergies.  Medications: Prior to Admission medications   Medication Sig Start Date End Date Taking? Authorizing Provider  acetaminophen (TYLENOL) 325 MG tablet Take 1-2 tablets (325-650 mg total) by mouth every 4 (four) hours as needed. Patient taking differently: Take 325-650 mg by mouth every 4 (four) hours as needed for mild pain, moderate pain or headache.  08/02/12  Yes Love, Ivan Anchors, PA-C  aspirin EC 81 MG tablet Take 81 mg by mouth daily after breakfast.   Yes [provider]  Calcium Carbonate (CALCIUM-CARB 600 PO) Take 1 tablet by mouth daily after breakfast.   Yes [provider]  meloxicam (MOBIC) 15 MG tablet Take 15 mg by mouth daily after breakfast. with food 08/18/16  Yes [provider]  Multiple Vitamin (MULTIVITAMIN WITH MINERALS) TABS Take 1 tablet by mouth daily after breakfast.    Yes [provider]  psyllium (METAMUCIL) 0.52 G capsule Take 1.04 g by mouth daily after breakfast.    Yes [provider]  ferrous sulfate 325 (65 FE) MG tablet Take 1 tablet (325 mg total) by mouth 2 (two) times daily with a meal. For anemia Patient not taking: Reported on 09/27/2016 08/02/12   Love, Ivan Anchors, PA-C  losartan (COZAAR) 50 MG tablet Take 1 tablet (50 mg total) by mouth daily. Patient not taking: Reported on 09/27/2016 08/02/12   Love, Ivan Anchors, PA-C  metFORMIN (GLUCOPHAGE) 500 MG tablet Take 1 tablet (500 mg total) by mouth daily with breakfast. For diabetes Patient  not taking: Reported on 09/27/2016 08/02/12   Love, Ivan Anchors, PA-C     Family History  Problem Relation Age of Onset  . Lung cancer Mother 50    Social History   Social History  . Marital status: Single    Spouse name: N/A  . Number of children: N/A  . Years of education: N/A   Occupational  History  . 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 Smoking status: Never Smoker  . Smokeless tobacco: Never Used  . Alcohol use Yes     Comment: occas wine  . Drug use: No  . Sexual activity: Not Asked   Other Topics Concern  . None   Social History Narrative  . None      Review of Systems see above; denies CP,dyspnea, cough, back pain or bleeding  Vital Signs: BP (!) 152/65 (BP Location: Left Arm)   Pulse 94   Temp 98.6 F (37 C) (Oral)   Resp 20   Ht 6' (1.829 m)   Wt 168 lb 3.4 oz (76.3 kg)   SpO2 97%   BMI 22.81 kg/m   Physical Exam awake/alert; chest- CTA bilat; heart- RRR; abd- soft,+BS, NT; ext- no edema  Mallampati Score:     Imaging: Dg Chest 2 View  Result Date: 09/27/2016 CLINICAL DATA:  Subacute onset of worsening generalized weakness. Recently discovered liver mass. Initial encounter. EXAM: CHEST  2 VIEW COMPARISON:  Chest radiograph performed 07/25/2012 FINDINGS: The lungs are well-aerated and clear. There is no evidence of focal opacification, pleural effusion or pneumothorax. The heart is borderline normal in size. No acute osseous abnormalities are seen. IMPRESSION: No acute cardiopulmonary process seen. Electronically Signed   By: Garald Balding M.D.   On: 09/27/2016 21:33   Mr Abdomen Wwo Contrast  Result Date: 09/19/2016 CLINICAL DATA:  Liver lesions seen on ultrasound EXAM: MRI ABDOMEN WITHOUT AND WITH CONTRAST TECHNIQUE: Multiplanar multisequence MR imaging of the abdomen was performed both before and after the administration of intravenous contrast. CONTRAST:  10mL MULTIHANCE GADOBENATE DIMEGLUMINE 529 MG/ML IV SOLN COMPARISON:  Ultrasound exam 09/11/2016 FINDINGS: Lower chest:  Unremarkable. Hepatobiliary: 9.1 x 7.0 x 10.2 cm mass is identified in the lateral segment left liver and extending inferiorly. Lesion has an irregular large central area of nonenhancement with areas of peripheral rim enhancement and altered  perfusion through much of the lateral segment left liver. Multiple small satellite lesions are associated. Pancreas: No focal mass lesion. No dilatation of the main duct. No intraparenchymal cyst. No peripancreatic edema. Spleen: No splenomegaly. No focal mass lesion. Adrenals/Urinary Tract: No adrenal nodule or mass. Multiple bilateral renal cysts are evident. Some of these cysts have T1 shortening on precontrast imaging compatible with internal proteinaceous debris or hemorrhage. Stomach/Bowel: Stomach is nondistended. No gastric wall thickening. No evidence of outlet obstruction. Duodenum is normally positioned as is the ligament of Treitz. No small bowel or colonic dilatation within the visualized abdomen. Vascular/Lymphatic: No abdominal aortic aneurysm. 2.7 x 2.9 cm retrocaval enhancing irregular soft tissue nodule is identified medial to the right kidney (see image 67 of series 1005). Other: No intraperitoneal free fluid. Musculoskeletal: No abnormal marrow enhancement within the visualized bony anatomy. IMPRESSION: 1. 9 x 7 x 10 mm mass largely replaces the lateral segment of the left liver and extends inferiorly. Lesion has a large central area of nonenhancement likely related to degeneration or necrosis. Several satellite nodules are evident. Imaging features remain highly concerning for  neoplasm. Given the extension below the liver, abscess is considered less likely. 2. 3 cm retrocaval lymph node/soft tissue nodule, concerning for metastatic disease. 3. PET-CT recommended to further evaluate. Electronically Signed   By: Misty Stanley M.D.   On: 09/19/2016 09:24   US Abdomen Complete  Result Date: 09/11/2016 CLINICAL DATA:  Decrease in energy and appetite for 12 days. Nausea and vomiting 1 week ago. Elevated liver function tests. EXAM: ABDOMEN ULTRASOUND COMPLETE COMPARISON:  None. FINDINGS: Gallbladder: Multiple tiny and mobile stones are seen layering dependently within the gallbladder. There is no  gallbladder wall thickening or pericholecystic fluid. Sonographer reports negative Murphy's sign. Common bile duct: Diameter: 0.4 cm Liver: The liver is markedly heterogeneous. A hypoechoic mass lesion measuring 9.0 x 4.9 x 8.9 cm is identified in the left hepatic lobe. Portal triads for atypically prominent suggestive of hepatitis. IVC: No abnormality visualized. Pancreas: Visualized portion unremarkable. Spleen: No focal lesion.  The spleen appears prominent. Right Kidney: Length: 11.9 cm. Echogenicity within normal limits. No solid mass or hydronephrosis visualized. A few small and simple appearing cysts are identified. Left Kidney: Length: 10.9 cm. Echogenicity within normal limits. No solid mass or hydronephrosis visualized. A few small cysts are noted. Abdominal aorta: No aneurysm visualized. Other findings: None. IMPRESSION: Large mass lesion in the left hepatic lobe worrisome for primary or possibly metastatic carcinoma. MRI of the abdomen with and without contrast is recommended for further evaluation. Atypically increased echogenicity of portal triads as can be seen in hepatitis. Prominent appearing spleen. Tiny gallstones without evidence of cholecystitis. These results will be called to the ordering clinician or representative by the Radiologist Assistant, and communication documented in the PACS or zVision Dashboard. Electronically Signed   By: Inge Rise M.D.   On: 09/11/2016 12:42    Labs:  CBC:  Recent Labs  09/27/16 2105 09/28/16 0435  WBC 15.5* 15.9*  HGB 7.7* 8.3*  HCT 23.6* 25.7*  PLT 373 331    COAGS:  Recent Labs  09/28/16 0155  INR 1.38  APTT 40*    BMP:  Recent Labs  09/27/16 2105 09/28/16 0435  NA 133* 134*  K 4.0 4.1  CL 100* 102  CO2 25 23  GLUCOSE 179* 196*  BUN 17 16  CALCIUM 8.6* 8.3*  CREATININE 1.04 1.04  GFRNONAA >60 >60  GFRAA >60 >60    LIVER FUNCTION TESTS:  Recent Labs  09/27/16 2105  BILITOT 0.6  AST 47*  ALT 43  ALKPHOS  158*  PROT 6.8  ALBUMIN 2.5*    TUMOR MARKERS: No results for input(s): AFPTM, CEA, CA199, CHROMGRNA in the last 8760 hours.  Assessment and Plan: 71 y.o. male with PMH sig for DM,prostate cancer (prostatectomy 2012) who was admitted yesterday with worsening fatigue, weakness, weight loss, sweats, fever, nausea, leukocytosis and elevated LFT's. He recently completed course of OP anitbiotic therapy. Prior US abd noted large mass lesion in left hepatic lobe worrisome for malignancy. Subsequent MRI abd on 09/18/16 revealed  1. 9 x 7 x 10 mm mass largely replaces the lateral segment of the left liver and extends inferiorly. Lesion has a large central area of nonenhancement likely related to degeneration or necrosis. Several satellite nodules are evident. Imaging features remain highly concerning for neoplasm. Given the extension below the liver, abscess is considered less likely. 2. 3 cm retrocaval lymph node/soft tissue nodule, concerning for metastatic disease.  He was scheduled to follow up with [facility] but presented to ED yesterday with above symptoms.Request  now received for image guided biopsy/aspiration/possible drainage of the above noted left hepatic lobe cystic mass. Imaging studies have been reviewed by Dr. Earleen Newport. Risks and benefits discussed with the patient including, but not limited to bleeding, infection, damage to adjacent structures or low yield requiring additional tests.All of the patient's questions were answered, patient is agreeable to proceed.Consent signed and in chart. Procedure tent planned for 6/12.     Thank you for this interesting consult.  I greatly enjoyed meeting Billy Horn and look forward to participating in their care.  A copy of this report was sent to the requesting provider on this date.  Electronically Signed: D. Rowe Robert, PA-C 09/28/2016, 6:01 PM   I spent a total of 40 minutes  in face to face in clinical consultation, greater than 50% of which  was counseling/coordinating care for image guided biopsy/aspiration/possible drainage of hepatic cystic mass

## 2016-09-29 ENCOUNTER — Inpatient Hospital Stay (HOSPITAL_COMMUNITY): Payer: Medicare HMO

## 2016-09-29 DIAGNOSIS — Z8546 Personal history of malignant neoplasm of prostate: Secondary | ICD-10-CM

## 2016-09-29 DIAGNOSIS — Z801 Family history of malignant neoplasm of trachea, bronchus and lung: Secondary | ICD-10-CM

## 2016-09-29 DIAGNOSIS — Z9689 Presence of other specified functional implants: Secondary | ICD-10-CM

## 2016-09-29 DIAGNOSIS — E119 Type 2 diabetes mellitus without complications: Secondary | ICD-10-CM

## 2016-09-29 DIAGNOSIS — Z794 Long term (current) use of insulin: Secondary | ICD-10-CM

## 2016-09-29 DIAGNOSIS — Z9889 Other specified postprocedural states: Secondary | ICD-10-CM

## 2016-09-29 LAB — CBC WITH DIFFERENTIAL/PLATELET
Basophils Absolute: 0 10*3/uL (ref 0.0–0.1)
Basophils Relative: 0 %
EOS ABS: 0 10*3/uL (ref 0.0–0.7)
Eosinophils Relative: 0 %
HEMATOCRIT: 23.2 % — AB (ref 39.0–52.0)
HEMOGLOBIN: 7.7 g/dL — AB (ref 13.0–17.0)
LYMPHS ABS: 1.2 10*3/uL (ref 0.7–4.0)
LYMPHS PCT: 8 %
MCH: 27.6 pg (ref 26.0–34.0)
MCHC: 33.2 g/dL (ref 30.0–36.0)
MCV: 83.2 fL (ref 78.0–100.0)
Monocytes Absolute: 1.5 10*3/uL — ABNORMAL HIGH (ref 0.1–1.0)
Monocytes Relative: 11 %
NEUTROS PCT: 81 %
Neutro Abs: 11.5 10*3/uL — ABNORMAL HIGH (ref 1.7–7.7)
Platelets: 310 10*3/uL (ref 150–400)
RBC: 2.79 MIL/uL — AB (ref 4.22–5.81)
RDW: 13.4 % (ref 11.5–15.5)
WBC: 14.2 10*3/uL — AB (ref 4.0–10.5)

## 2016-09-29 LAB — URINE CULTURE

## 2016-09-29 LAB — RETICULOCYTES
RBC.: 2.79 MIL/uL — ABNORMAL LOW (ref 4.22–5.81)
RETIC CT PCT: 1.4 % (ref 0.4–3.1)
Retic Count, Absolute: 39.1 10*3/uL (ref 19.0–186.0)

## 2016-09-29 LAB — HEPATITIS PANEL, ACUTE
HCV Ab: <0.1 s/co ratio (ref 0.0–0.9)
HEP B S AG: NEGATIVE
Hep A IgM: NEGATIVE
Hep B C IgM: NEGATIVE

## 2016-09-29 LAB — COMPREHENSIVE METABOLIC PANEL
ALBUMIN: 2.2 g/dL — AB (ref 3.5–5.0)
ALT: 47 U/L (ref 17–63)
AST: 44 U/L — AB (ref 15–41)
Alkaline Phosphatase: 136 U/L — ABNORMAL HIGH (ref 38–126)
Anion gap: 9 (ref 5–15)
BILIRUBIN TOTAL: 0.8 mg/dL (ref 0.3–1.2)
BUN: 13 mg/dL (ref 6–20)
CO2: 23 mmol/L (ref 22–32)
CREATININE: 0.99 mg/dL (ref 0.61–1.24)
Calcium: 8.1 mg/dL — ABNORMAL LOW (ref 8.9–10.3)
Chloride: 101 mmol/L (ref 101–111)
GFR calc Af Amer: >60 mL/min (ref >60)
GLUCOSE: 129 mg/dL — AB (ref 65–99)
Potassium: 3.8 mmol/L (ref 3.5–5.1)
Sodium: 133 mmol/L — ABNORMAL LOW (ref 135–145)
TOTAL PROTEIN: 5.9 g/dL — AB (ref 6.5–8.1)

## 2016-09-29 LAB — GLUCOSE, CAPILLARY
GLUCOSE-CAPILLARY: 145 mg/dL — AB (ref 65–99)
Glucose-Capillary: 110 mg/dL — ABNORMAL HIGH (ref 65–99)
Glucose-Capillary: 94 mg/dL (ref 65–99)
Glucose-Capillary: 118 mg/dL — ABNORMAL HIGH (ref 65–99)

## 2016-09-29 LAB — HEMOGLOBIN A1C
HEMOGLOBIN A1C: 7.4 % — AB (ref 4.8–5.6)
MEAN PLASMA GLUCOSE: 166 mg/dL

## 2016-09-29 LAB — IRON AND TIBC
Iron: 9 ug/dL — ABNORMAL LOW (ref 45–182)
SATURATION RATIOS: 6 % — AB (ref 17.9–39.5)
TIBC: 154 ug/dL — AB (ref 250–450)
UIBC: 145 ug/dL

## 2016-09-29 LAB — PROTIME-INR
INR: 1.35
PROTHROMBIN TIME: 16.8 s — AB (ref 11.4–15.2)

## 2016-09-29 LAB — AFP TUMOR MARKER: AFP TUMOR MARKER: 1 ng/mL (ref 0.0–8.3)

## 2016-09-29 LAB — FOLATE: Folate: 31.2 ng/mL (ref >5.9)

## 2016-09-29 LAB — PREPARE RBC (CROSSMATCH)

## 2016-09-29 LAB — FERRITIN: FERRITIN: 482 ng/mL — AB (ref 24–336)

## 2016-09-29 LAB — VITAMIN B12: VITAMIN B 12: 831 pg/mL (ref 180–914)

## 2016-09-29 MED ORDER — FENTANYL CITRATE (PF) 100 MCG/2ML IJ SOLN
INTRAMUSCULAR | Status: AC
  Filled 2016-09-29: qty 2

## 2016-09-29 MED ORDER — ALUM & MAG HYDROXIDE-SIMETH 200-200-20 MG/5ML PO SUSP
30.0000 mL | ORAL | Status: DC | PRN
  Filled 2016-09-29: qty 30

## 2016-09-29 MED ORDER — SODIUM CHLORIDE 0.9 % IV SOLN
Freq: Once | INTRAVENOUS | Status: AC
  Administered 2016-09-29: 19:00:00 via INTRAVENOUS

## 2016-09-29 MED ORDER — MIDAZOLAM HCL 2 MG/2ML IJ SOLN
INTRAMUSCULAR | Status: AC
  Filled 2016-09-29: qty 6

## 2016-09-29 MED ORDER — MIDAZOLAM HCL 2 MG/2ML IJ SOLN
INTRAMUSCULAR | Status: AC | PRN
  Administered 2016-09-29: 1 mg via INTRAVENOUS
  Administered 2016-09-29: 0.5 mg via INTRAVENOUS
  Administered 2016-09-29 (×2): 1 mg via INTRAVENOUS
  Administered 2016-09-29: 0.5 mg via INTRAVENOUS

## 2016-09-29 MED ORDER — FENTANYL CITRATE (PF) 100 MCG/2ML IJ SOLN
INTRAMUSCULAR | Status: AC | PRN
  Administered 2016-09-29 (×2): 50 ug via INTRAVENOUS

## 2016-09-29 NOTE — Consult Note (Signed)
[facility]    Date of Admission:  09/27/2016           Day day 3 piperacillin tazobactam       Reason for Consult: Liver abscess    Referring Physician: Dr. Niel Hummer  Primary Care Physician: Dr. Donnie Coffin  Principal Problem:   Liver abscess Active Problems:   Diabetes mellitus type 2, uncontrolled ([place])   Sepsis ([place])   Anemia   . aspirin EC  81 mg Oral QPC breakfast  . docusate sodium  100 mg Oral BID  . fentaNYL      . ferrous sulfate  325 mg Oral BID WC  . insulin aspart  0-15 Units Subcutaneous TID WC  . insulin aspart  0-5 Units Subcutaneous QHS  . midazolam      . psyllium  1 packet Oral QPC breakfast    Recommendations: 1. Continue piperacillin tazobactam pending abscess Gram stain and cultures   Assessment: He has a large liver abscess. I will continue piperacillin tazobactam pending final cultures. Will probably need IV antibiotic therapy and I will make a decision on PICC placement tomorrow morning. I generally do not pursue a diagnostic workup looking for a source of infection. There is some evidence that Klebsiella liver abscesses may be associated with colon cancer. I would not do any further diagnostic testing at this point. I will follow-up in the morning.    HPI: Billy Horn is a 71 y.o. male who began to develop high fevers about 1 month ago. He also developed profound fatigue. Initial testing was unrevealing. He did not have much improvement on an 8 day course of oral amoxicillin. He finally underwent abdominal MRI which appeared to show a large mass in his left liver leading to admission 3 days ago. Today he underwent needle aspiration which revealed purulent fluid compatible with a liver abscess. He's undergone multiple screening colonoscopies, the last about 4 years ago, all of which were negative.   Review of Systems: Review of Systems  Constitutional: Positive for chills, fever, malaise/fatigue and  weight loss. Negative for diaphoresis.  Gastrointestinal: Negative for abdominal pain, diarrhea, nausea and vomiting.    Past Medical History:  Diagnosis Date  . Cancer [facility]) 2012   prostate cancer  . Diabetes mellitus    controlled with diet and exercise    Social History  Substance Use Topics  . Smoking status: Never Smoker  . Smokeless tobacco: Never Used  . Alcohol use Yes     Comment: occas wine    Family History  Problem Relation Age of Onset  . Lung cancer Mother 1   No Known Allergies  OBJECTIVE: Blood pressure (!) 153/73, pulse 88, temperature 98.8 F (37.1 C), temperature source Oral, resp. rate 18, height 6' (1.829 m), weight 168 lb 3.4 oz (76.3 kg), SpO2 98 %.  Physical Exam  Constitutional: He is oriented to person, place, and time.  He is alert, pleasant and in no distress.  Cardiovascular: Normal rate and regular rhythm.   No murmur heard. Pulmonary/Chest: Effort normal.  Abdominal: Soft. He exhibits no mass. There is no tenderness.  Dark brown fluid in his midline, upper abdominal drain.  Neurological: He is alert and oriented to person, place, and time.  Skin: No rash noted.  Psychiatric: Mood and affect normal.    Lab Results Lab Results  Component Value Date   WBC 14.2 (H) 09/29/2016   HGB 7.7 (L)  09/29/2016   HCT 23.2 (L) 09/29/2016   MCV 83.2 09/29/2016   PLT 310 09/29/2016    Lab Results  Component Value Date   CREATININE 0.99 09/29/2016   BUN 13 09/29/2016   NA 133 (L) 09/29/2016   K 3.8 09/29/2016   CL 101 09/29/2016   CO2 23 09/29/2016    Lab Results  Component Value Date   ALT 47 09/29/2016   AST 44 (H) 09/29/2016   ALKPHOS 136 (H) 09/29/2016   BILITOT 0.8 09/29/2016     Microbiology: Recent Results (from the past 240 hour(s))  Blood Culture (routine x 2)     Status: None (Preliminary result)   Collection Time: 09/27/16  9:05 PM  Result Value Ref Range Status   Specimen Description BLOOD BLOOD RIGHT FOREARM  Final    Special Requests   Final    BOTTLES DRAWN AEROBIC AND ANAEROBIC Blood Culture adequate volume   Culture   Final    NO GROWTH 1 DAY Performed at [facility], [place] [address], [place], [place] [address]    Report Status PENDING  Incomplete  Blood Culture (routine x 2)     Status: None (Preliminary result)   Collection Time: 09/27/16  9:35 PM  Result Value Ref Range Status   Specimen Description BLOOD RIGHT ANTECUBITAL  Final   Special Requests   Final    BOTTLES DRAWN AEROBIC AND ANAEROBIC Blood Culture adequate volume   Culture   Final    NO GROWTH 1 DAY Performed at [facility], [place] [address], [place], [place] [address]    Report Status PENDING  Incomplete  Culture, Urine     Status: Abnormal   Collection Time: 09/27/16 10:09 PM  Result Value Ref Range Status   Specimen Description URINE, CLEAN CATCH  Final   Special Requests NONE  Final   Culture MULTIPLE SPECIES PRESENT, SUGGEST RECOLLECTION (A)  Final   Report Status 09/29/2016 FINAL  Final    Michel Bickers, MD [facility] [phone] pager   [phone] cell 09/29/2016, 3:52 PM

## 2016-09-29 NOTE — Sedation Documentation (Signed)
Patient is resting comfortably. 

## 2016-09-29 NOTE — Progress Notes (Addendum)
PROGRESS NOTE    Billy Horn  IWP:[id number] DOB: November 27, 1945 DOA: 09/27/2016 PCP: Alroy Dust, L.Marlou Sa, MD   Brief Narrative: Billy Horn is a 71 y.o. male with medical history significant of remote prostate CA and diabetes (not taking medications despite A1c of 7 - prefers diet and exercise) with newly recognized 9 x 7 x 10 cm lass largely replacing the lateral segment of the left liver and extending inferiorly with a large central area of probable necrosis, with several satellite nodules highly concerning for neoplasm and with 3 cm retrocaval LN concerning for metastatic disease (diagnosed by MRI 6/1).  He has been "on about a three week siege with fatigue and high temperatures."  Saw PCP and fever was 103.  He was started on  antibiotics (Ampicillin).  He was then scheduled for a CT and then MRI a week ago Friday.  Results last week showed a tumor on his liver.  He is awaiting additional testing and to meet with Dr. Barry Dienes in all probability for surgery.  Occasionally checks temperatures at home, 102 today.  He has extreme fatigue.  Today it was the worst of his 3 weeks and he just felt like he had to come in.  He is unable to complete normal day-to-day tasks.  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  He has to lie down after being up 30 minutes, just exhausted.  No SOB.  No pain.  Weight loss, maybe 20 pounds in the last 3 weeks.  Dr. Alroy Dust thinks the tumor is cancerous but he needs more testing before surgery.  H/o prostate CA surgery in 2012.  [place] and broke his tibia the next year with surgery (2014).  Other than that, he reports no real medical issues.    Assessment & Plan:   Principal Problem:   Sepsis ([place]) Active Problems:   Diabetes mellitus type 2, uncontrolled (HCC)   Liver mass   Anemia   Liver mass, fever; Liver abscess;  MRI ; Hepatobiliary: 9.1 x 7.0 x 10.2 cm mass is identified in the lateral segment left liver and extending  inferiorly. Lesion has an irregular large central area of nonenhancement with areas of peripheral rim enhancement and altered perfusion through much of the lateral segment left liver. Multiple small satellite lesions are associated. -Blood culture pending.  -IV zosyn to cover for infection.  -Surgery consulted. Recommend IR biopsy, evaluation. Sample was send for culture, discussed with Dr Pascal Lux no need for cytology, and sample material was purulent unlikely sample could be use for cytology/  -Alfa-fetoprotein negative, Hepatitis panel negative.  -Underwent CT guide placement drainage catheter  By IR 6-12. -ID consulted, will defer to ID ordering CT abdomen and pelvis.   2-Anemia;  Iron deficiency  anemia.  On oral iron/  Will transfuse one unit PRBC.   Severe Malnutrition in contest of acute illness.  On supplement.   Sepsis;  Related to liver abscess.  IV fluids.  IV antibiotics.  Follow blood culture. pending  Malnutrition;  Nutrition consulted.   DM;  SSI.   Hyponatremia; IV fluids.   DVT prophylaxis: scd.  Code Status: dnr Family Communication: care discussed with patient.  Disposition Plan: remain in the hospital.   Consultants:   Surgery    Procedures:      Antimicrobials:   IV zosyn 6-10   Subjective: He is feeling the same, tired, fatigue.  Denies pain, no diarrhea.    Objective: Vitals:  09/29/16 1245 09/29/16 1250 09/29/16 1255 09/29/16 1257  BP: 131/80 137/66 132/67 129/65  Pulse: 84 85 85 86  Resp: (!) 22 (!) 22 (!) 21 (!) 25  Temp:      TempSrc:      SpO2: 97% 98% 98% 98%  Weight:      Height:        Intake/Output Summary (Last 24 hours) at 09/29/16 1310 Last data filed at 09/29/16 1100  Gross per 24 hour  Intake          1401.25 ml  Output              525 ml  Net           876.25 ml   Filed Weights   09/28/16 0149  Weight: 76.3 kg (168 lb 3.4 oz)    Examination:  General exam: No acute distress.  Respiratory system:  CTA, normal respiratory effort.  Cardiovascular system: Si , S 2 RRR Gastrointestinal system: BS present, soft, nt Central nervous system: alert and oriented, non focal.  Extremities: Symmetric power.  Skin: No rashes, lesions or ulcers     Data Reviewed: I have personally reviewed following labs and imaging studies  CBC:  Recent Labs Lab 09/27/16 2105 09/28/16 0435 09/29/16 0418  WBC 15.5* 15.9* 14.2*  NEUTROABS 12.5*  --  11.5*  HGB 7.7* 8.3* 7.7*  HCT 23.6* 25.7* 23.2*  MCV 81.4 82.1 83.2  PLT 373 331 854   Basic Metabolic Panel:  Recent Labs Lab 09/27/16 2105 09/28/16 0435 09/29/16 0418  NA 133* 134* 133*  K 4.0 4.1 3.8  CL 100* 102 101  CO2 25 23 23   GLUCOSE 179* 196* 129*  BUN 17 16 13   CREATININE 1.04 1.04 0.99  CALCIUM 8.6* 8.3* 8.1*   GFR: Estimated Creatinine Clearance: 73.9 mL/min (by C-G formula based on SCr of 0.99 mg/dL). Liver Function Tests:  Recent Labs Lab 09/27/16 2105 09/29/16 0418  AST 47* 44*  ALT 43 47  ALKPHOS 158* 136*  BILITOT 0.6 0.8  PROT 6.8 5.9*  ALBUMIN 2.5* 2.2*   No results for input(s): LIPASE, AMYLASE in the last 168 hours. No results for input(s): AMMONIA in the last 168 hours. Coagulation Profile:  Recent Labs Lab 09/28/16 0155 09/29/16 0418  INR 1.38 1.35   Cardiac Enzymes: No results for input(s): CKTOTAL, CKMB, CKMBINDEX, TROPONINI in the last 168 hours. BNP (last 3 results) No results for input(s): PROBNP in the last 8760 hours. HbA1C:  Recent Labs  09/28/16 0155  HGBA1C 7.4*   CBG:  Recent Labs Lab 09/28/16 0801 09/28/16 1400 09/28/16 1733 09/28/16 2237 09/29/16 0752  GLUCAP 164* 113* 103* 132* 110*   Lipid Profile: No results for input(s): CHOL, HDL, LDLCALC, TRIG, CHOLHDL, LDLDIRECT in the last 72 hours. Thyroid Function Tests: No results for input(s): TSH, T4TOTAL, FREET4, T3FREE, THYROIDAB in the last 72 hours. Anemia Panel:  Recent Labs  09/29/16 0418  VITAMINB12 831    FOLATE 31.2  FERRITIN 482*  TIBC 154*  IRON 9*  RETICCTPCT 1.4   Sepsis Labs:  Recent Labs Lab 09/27/16 2115 09/28/16 0155  PROCALCITON  --  0.86  LATICACIDVEN 0.70  --     Recent Results (from the past 240 hour(s))  Culture, Urine     Status: Abnormal   Collection Time: 09/27/16 10:09 PM  Result Value Ref Range Status   Specimen Description URINE, CLEAN CATCH  Final   Special Requests NONE  Final   Culture  MULTIPLE SPECIES PRESENT, SUGGEST RECOLLECTION (A)  Final   Report Status 09/29/2016 FINAL  Final         Radiology Studies: Dg Chest 2 View  Result Date: 09/27/2016 CLINICAL DATA:  Subacute onset of worsening generalized weakness. Recently discovered liver mass. Initial encounter. EXAM: CHEST  2 VIEW COMPARISON:  Chest radiograph performed 07/25/2012 FINDINGS: The lungs are well-aerated and clear. There is no evidence of focal opacification, pleural effusion or pneumothorax. The heart is borderline normal in size. No acute osseous abnormalities are seen. IMPRESSION: No acute cardiopulmonary process seen. Electronically Signed   By: Garald Balding M.D.   On: 09/27/2016 21:33        Scheduled Meds: . aspirin EC  81 mg Oral QPC breakfast  . docusate sodium  100 mg Oral BID  . fentaNYL      . ferrous sulfate  325 mg Oral BID WC  . insulin aspart  0-15 Units Subcutaneous TID WC  . insulin aspart  0-5 Units Subcutaneous QHS  . midazolam      . psyllium  1 packet Oral QPC breakfast   Continuous Infusions: . sodium chloride 75 mL/hr at 09/29/16 1100  . sodium chloride    . piperacillin-tazobactam (ZOSYN)  IV Stopped (09/29/16 1022)     LOS: 1 day    Time spent: 35 minutes.     Elmarie Shiley, MD [facility] Pager [phone]  If 7PM-7AM, please contact night-coverage [email or web address] Password [email or web address] 09/29/2016, 1:10 PM

## 2016-09-29 NOTE — Progress Notes (Signed)
    CC:  Liver mass with fever  Subjective: He just feels washed out, waiting for the IR procedure later this AM.    Objective: Vital signs in last 24 hours: Temp:  [98 F (36.7 C)-99 F (37.2 C)] 98 F (36.7 C) (06/12 0457) Pulse Rate:  [68-94] 78 (06/12 0457) Resp:  [16-20] 16 (06/12 0457) BP: (137-152)/(65-73) 137/65 (06/12 0457) SpO2:  [97 %-99 %] 98 % (06/12 0457) Last BM Date: 09/28/16 NPO 1900 IV 375 urine recorded BM x 1 Afebrile, last 24 hours, VSS CMP OK , alk Phos up some 136 WBC 14,2 better, H/H is down 7.7/23.2 CXR - OK    Intake/Output from previous day: 06/11 0701 - 06/12 0700 In: 1868.8 [I.V.:1818.8; IV Piggyback:50] Out: 375 [Urine:375] Intake/Output this shift: No intake/output data recorded.  General appearance: alert, cooperative and no distress Resp: clear to auscultation bilaterally GI: soft, non-tender; bowel sounds normal; no masses,  no organomegaly  Lab Results:   Recent Labs  09/28/16 0435 09/29/16 0418  WBC 15.9* 14.2*  HGB 8.3* 7.7*  HCT 25.7* 23.2*  PLT 331 310    BMET  Recent Labs  09/28/16 0435 09/29/16 0418  NA 134* 133*  K 4.1 3.8  CL 102 101  CO2 23 23  GLUCOSE 196* 129*  BUN 16 13  CREATININE 1.04 0.99  CALCIUM 8.3* 8.1*   PT/INR  Recent Labs  09/28/16 0155 09/29/16 0418  LABPROT 17.1* 16.8*  INR 1.38 1.35     Recent Labs Lab 09/27/16 2105 09/29/16 0418  AST 47* 44*  ALT 43 47  ALKPHOS 158* 136*  BILITOT 0.6 0.8  PROT 6.8 5.9*  ALBUMIN 2.5* 2.2*     Lipase  No results found for: LIPASE   Medications: . aspirin EC  81 mg Oral QPC breakfast  . docusate sodium  100 mg Oral BID  . ferrous sulfate  325 mg Oral BID WC  . insulin aspart  0-15 Units Subcutaneous TID WC  . insulin aspart  0-5 Units Subcutaneous QHS  . psyllium  1 packet Oral QPC breakfast   . sodium chloride 75 mL/hr at 09/28/16 0146  . sodium chloride    . piperacillin-tazobactam (ZOSYN)  IV 3.375 g (09/29/16 0622)    Anti-infectives    Start     Dose/Rate Route Frequency Ordered Stop   09/28/16 0600  piperacillin-tazobactam (ZOSYN) IVPB 3.375 g     3.375 g 12.5 mL/hr over 240 Minutes Intravenous Every 8 hours 09/28/16 0159     09/27/16 2315  piperacillin-tazobactam (ZOSYN) IVPB 3.375 g     3.375 g 100 mL/hr over 30 Minutes Intravenous  Once 09/27/16 2309 09/28/16 0010     Assessment/Plan Liver Mass 9 x 7 x 10 mm, left lobe Fever Weight loss Anemia - HBG 7.7 Hx of prostate cancer with radical prostatectomy, - Dr. Alinda Money, negative PSA hx Type II diabetes - diet/exercise controlled. FEN:  IV fluids/NPO ID:  Zosyn 09/27/16 =>> day 3 DVT: SCD   Plan:  IR for Bx today,  We will follow.  Note from Dr. Pascal Lux, IR drain into left liver lobe abscess with 90 ml of purulent material drained, samples to lab for culture.      LOS: 1 day    Laqueisha Catalina 09/29/2016 [phone]

## 2016-09-29 NOTE — Procedures (Signed)
Pre procedural Dx: Hepatic mass Post procedural Dx: Hepatic abscess  Technically successful CT guided placed of a 10 Fr drainage catheter placement into abscess within the left lobe of the liver yielding 90 cc of purulent material.    All aspirated samples sent to the laboratory for analysis.    EBL: Minimal  Complications: None immediate  Ronny Bacon, MD Pager #: [phone]

## 2016-09-30 ENCOUNTER — Inpatient Hospital Stay (HOSPITAL_COMMUNITY): Payer: Medicare HMO

## 2016-09-30 DIAGNOSIS — E088 Diabetes mellitus due to underlying condition with unspecified complications: Secondary | ICD-10-CM

## 2016-09-30 LAB — GLUCOSE, CAPILLARY
GLUCOSE-CAPILLARY: 111 mg/dL — AB (ref 65–99)
GLUCOSE-CAPILLARY: 176 mg/dL — AB (ref 65–99)
Glucose-Capillary: 110 mg/dL — ABNORMAL HIGH (ref 65–99)
Glucose-Capillary: 118 mg/dL — ABNORMAL HIGH (ref 65–99)
Glucose-Capillary: 111 mg/dL — ABNORMAL HIGH (ref 65–99)

## 2016-09-30 LAB — COMPREHENSIVE METABOLIC PANEL
ALBUMIN: 2.4 g/dL — AB (ref 3.5–5.0)
ALK PHOS: 166 U/L — AB (ref 38–126)
ALT: 42 U/L (ref 17–63)
ANION GAP: 9 (ref 5–15)
AST: 42 U/L — ABNORMAL HIGH (ref 15–41)
BILIRUBIN TOTAL: 1.4 mg/dL — AB (ref 0.3–1.2)
BUN: 15 mg/dL (ref 6–20)
CALCIUM: 8.4 mg/dL — AB (ref 8.9–10.3)
CO2: 22 mmol/L (ref 22–32)
Chloride: 103 mmol/L (ref 101–111)
Creatinine, Ser: 1.03 mg/dL (ref 0.61–1.24)
GFR calc non Af Amer: >60 mL/min (ref >60)
GLUCOSE: 114 mg/dL — AB (ref 65–99)
POTASSIUM: 4.6 mmol/L (ref 3.5–5.1)
Sodium: 134 mmol/L — ABNORMAL LOW (ref 135–145)
TOTAL PROTEIN: 6.6 g/dL (ref 6.5–8.1)

## 2016-09-30 LAB — CBC
HEMATOCRIT: 30.6 % — AB (ref 39.0–52.0)
Hemoglobin: 10.5 g/dL — ABNORMAL LOW (ref 13.0–17.0)
MCH: 28 pg (ref 26.0–34.0)
MCHC: 34.3 g/dL (ref 30.0–36.0)
MCV: 81.6 fL (ref 78.0–100.0)
Platelets: 302 10*3/uL (ref 150–400)
RBC: 3.75 MIL/uL — ABNORMAL LOW (ref 4.22–5.81)
RDW: 13.5 % (ref 11.5–15.5)
WBC: 12.2 10*3/uL — ABNORMAL HIGH (ref 4.0–10.5)

## 2016-09-30 LAB — TYPE AND SCREEN
ABO/RH(D): AB POS
ANTIBODY SCREEN: NEGATIVE
Unit division: 0

## 2016-09-30 LAB — BPAM RBC
BLOOD PRODUCT EXPIRATION DATE: 201806222359
ISSUE DATE / TIME: 201806121535
Unit Type and Rh: 6200

## 2016-09-30 MED ORDER — IOPAMIDOL (ISOVUE-300) INJECTION 61%
INTRAVENOUS | Status: AC
  Filled 2016-09-30: qty 100

## 2016-09-30 MED ORDER — SODIUM CHLORIDE 0.9% FLUSH: 10.0000 mL | INTRAVENOUS | Status: DC | PRN

## 2016-09-30 MED ORDER — SODIUM CHLORIDE 0.9% FLUSH
10.0000 mL | Freq: Two times a day (BID) | INTRAVENOUS | Status: DC
  Administered 2016-10-01: 20 mL
  Administered 2016-10-01 – 2016-10-03 (×4): 10 mL

## 2016-09-30 MED ORDER — SODIUM CHLORIDE 0.9 % IV SOLN
1.0000 g | INTRAVENOUS | Status: DC
  Administered 2016-09-30 – 2016-10-04 (×5): 1 g via INTRAVENOUS
  Filled 2016-09-30 (×5): qty 1

## 2016-09-30 MED ORDER — IOPAMIDOL (ISOVUE-300) INJECTION 61%: 15.0000 mL | Freq: Two times a day (BID) | INTRAVENOUS | Status: DC | PRN

## 2016-09-30 MED ORDER — IOPAMIDOL (ISOVUE-300) INJECTION 61%
100.0000 mL | Freq: Once | INTRAVENOUS | Status: AC | PRN
  Administered 2016-09-30: 100 mL via INTRAVENOUS

## 2016-09-30 MED ORDER — IOPAMIDOL (ISOVUE-300) INJECTION 61%
INTRAVENOUS | Status: AC
  Administered 2016-09-30: 16:00:00
  Filled 2016-09-30: qty 30

## 2016-09-30 NOTE — Progress Notes (Signed)
PROGRESS NOTE  Billy Horn AGT:[id number] DOB: [birth year] DOA: 09/27/2016 PCP: Alroy Dust, L.Marlou Sa, MD  HPI/Recap of past 62 hours: 71 year old male with past history of prostate CA and diet-controlled diabetes who had been diagnosed 10 days prior with a very large liver mass suspected to be tumor showing central area of necrosis and had been having several weeks of fatigue and high fever.  This was treated for a week with amoxicillin with no relief. The shortness of breath or pain only weight loss and worsening fatigue. Came to emergency room on 6/10 and found to be in sepsis attributed to liver mass.  Started on broad-spectrum antibiotics. Patient self doing well. He feels much better today, more energy. No pain.  Assessment/Plan: Principal Problem:   Liver abscess/mass: Patient admitted to hospitalist service. General surgery consulted who referred patient to interventional radiology. Patient went for a IR drain of left liver lobe abscess and over 90 cc of fluid taken. Infectious disease consulted as well. In the meantime, alpha-fetoprotein level and hepatitis panel both negative. Plan at this point pending cultures is to continue with Zosyn. White blood cell count continues to trend downward. We'll go ahead and check CT scan for completeness sake to rule out diverticulitis Active Problems:   Diabetes mellitus type 2, diet controlled: Patient on any home medications. A1c at 7.4   Sepsis [facility]): Patient met sepsis criteria on admission given tachycardia, leukocytosis with liver source. Improved. Sepsis stabilized   Anemia   Code Status: Full code   Family Communication: Left message for family   Disposition Plan: At this discharge in a few days once cultures back    Consultants:  Infectious disease  General surgery  Interventional radiology   Procedures:  Status post hepatic percutaneous drain placed 6/12   Antimicrobials:  IV Zosyn   DVT prophylaxis:   SCDs   Objective: Vitals:   09/29/16 1845 09/29/16 2127 09/30/16 0514 09/30/16 1403  BP: (!) 162/82 (!) 143/73 123/71 137/71  Pulse: 88 91 67 66  Resp: 18 (!) _0 Temp: 98.4 F (36.9 C) (!) 100.6 F (38.1 C) 98.3 F (36.8 C) 98.4 F (36.9 C)  TempSrc: Oral Oral Oral Oral  SpO2: 98% 96% 98% 98%  Weight:      Height:        Intake/Output Summary (Last 24 hours) at 09/30/16 1440 Last data filed at 09/30/16 1031  Gross per 24 hour  Intake              600 ml  Output              260 ml  Net              340 ml   Filed Weights   09/28/16 0149  Weight: 76.3 kg (168 lb 3.4 oz)    Exam:   General:  Alert and oriented 3, no acute distress   HEENT: Normocephalic and atraumatic, mucous members are moist  Cardiovascular: Regular rate and rhythm, S1-S2   Respiratory: Clear to auscultation bilaterally no labored breathing,  Abdomen: Soft, nontender, nondistended, hypoactive bowel sounds, percutaneous drain in place   Musculoskeletal: No clubbing or cyanosis or edema   Skin: No skin breaks, tears or lesions   Psychiatry: Patient is appropriate, no evidence of psychoses    Data Reviewed: CBC:  Recent Labs Lab 09/27/16 2105 09/28/16 0435 09/29/16 0418 09/30/16 0402  WBC 15.5* 15.9* 14.2* 12.2*  NEUTROABS 12.5*  --  11.5*  --  HGB 7.7* 8.3* 7.7* 10.5*  HCT 23.6* 25.7* 23.2* 30.6*  MCV 81.4 82.1 83.2 81.6  PLT 373 331 310 916   Basic Metabolic Panel:  Recent Labs Lab 09/27/16 2105 09/28/16 0435 09/29/16 0418 09/30/16 0402  NA 133* 134* 133* 134*  K 4.0 4.1 3.8 4.6  CL 100* 102 101 103  CO2 _0 GLUCOSE 179* 196* 129* 114*  BUN _1 CREATININE 1.04 1.04 0.99 1.03  CALCIUM 8.6* 8.3* 8.1* 8.4*   GFR: Estimated Creatinine Clearance: 71 mL/min (by C-G formula based on SCr of 1.03 mg/dL). Liver Function Tests:  Recent Labs Lab 09/27/16 2105 09/29/16 0418 09/30/16 0402  AST 47* 44* 42*  ALT 43 47 42  ALKPHOS 158* 136* 166*   BILITOT 0.6 0.8 1.4*  PROT 6.8 5.9* 6.6  ALBUMIN 2.5* 2.2* 2.4*   No results for input(s): LIPASE, AMYLASE in the last 168 hours. No results for input(s): AMMONIA in the last 168 hours. Coagulation Profile:  Recent Labs Lab 09/28/16 0155 09/29/16 0418  INR 1.38 1.35   Cardiac Enzymes: No results for input(s): CKTOTAL, CKMB, CKMBINDEX, TROPONINI in the last 168 hours. BNP (last 3 results) No results for input(s): PROBNP in the last 8760 hours. HbA1C:  Recent Labs  09/28/16 0155  HGBA1C 7.4*   CBG:  Recent Labs Lab 09/29/16 1656 09/29/16 2136 09/30/16 0517 09/30/16 0755 09/30/16 1207  GLUCAP 145* 118* 110* 111* 176*   Lipid Profile: No results for input(s): CHOL, HDL, LDLCALC, TRIG, CHOLHDL, LDLDIRECT in the last 72 hours. Thyroid Function Tests: No results for input(s): TSH, T4TOTAL, FREET4, T3FREE, THYROIDAB in the last 72 hours. Anemia Panel:  Recent Labs  09/29/16 0418  VITAMINB12 831  FOLATE 31.2  FERRITIN 482*  TIBC 154*  IRON 9*  RETICCTPCT 1.4   Urine analysis:    Component Value Date/Time   COLORURINE YELLOW 09/27/2016 2209   APPEARANCEUR CLEAR 09/27/2016 2209   LABSPEC 1.009 09/27/2016 2209   PHURINE 5.0 09/27/2016 2209   GLUCOSEU NEGATIVE 09/27/2016 2209   HGBUR NEGATIVE 09/27/2016 2209   BILIRUBINUR NEGATIVE 09/27/2016 2209   KETONESUR NEGATIVE 09/27/2016 2209   PROTEINUR NEGATIVE 09/27/2016 2209   NITRITE NEGATIVE 09/27/2016 2209   LEUKOCYTESUR NEGATIVE 09/27/2016 2209   Sepsis Labs: _2 (procalcitonin:4,lacticidven:4)  ) Recent Results (from the past 240 hour(s))  Blood Culture (routine x 2)     Status: None (Preliminary result)   Collection Time: 09/27/16  9:05 PM  Result Value Ref Range Status   Specimen Description BLOOD BLOOD RIGHT FOREARM  Final   Special Requests   Final    BOTTLES DRAWN AEROBIC AND ANAEROBIC Blood Culture adequate volume   Culture   Final    NO GROWTH 2 DAYS Performed at [facility], [place] [address] [place], [place] [address]    Report Status PENDING  Incomplete  Blood Culture (routine x 2)     Status: None (Preliminary result)   Collection Time: 09/27/16  9:35 PM  Result Value Ref Range Status   Specimen Description BLOOD RIGHT ANTECUBITAL  Final   Special Requests   Final    BOTTLES DRAWN AEROBIC AND ANAEROBIC Blood Culture adequate volume   Culture   Final    NO GROWTH 2 DAYS Performed at [facility], [place] [address], [place], [place] [address]    Report Status PENDING  Incomplete  Culture, Urine     Status: Abnormal   Collection Time: 09/27/16 10:09  PM  Result Value Ref Range Status   Specimen Description URINE, CLEAN CATCH  Final   Special Requests NONE  Final   Culture MULTIPLE SPECIES PRESENT, SUGGEST RECOLLECTION (A)  Final   Report Status 09/29/2016 FINAL  Final  Aerobic/Anaerobic Culture (surgical/deep wound)     Status: None (Preliminary result)   Collection Time: 09/29/16  1:10 PM  Result Value Ref Range Status   Specimen Description ABSCESS HEPATIC DRAIN  Final   Special Requests Normal  Final   Gram Stain   Final    ABUNDANT WBC PRESENT,BOTH PMN AND MONONUCLEAR ABUNDANT GRAM POSITIVE COCCI IN CHAINS FEW GRAM VARIABLE ROD    Culture   Final    MODERATE GRAM NEGATIVE RODS IDENTIFICATION AND SUSCEPTIBILITIES TO FOLLOW Performed at [facility], [place] [address] [place], [place] [address]    Report Status PENDING  Incomplete      Studies: No results found.  Scheduled Meds: . aspirin EC  81 mg Oral QPC breakfast  . docusate sodium  100 mg Oral BID  . ferrous sulfate  325 mg Oral BID WC  . insulin aspart  0-15 Units Subcutaneous TID WC  . insulin aspart  0-5 Units Subcutaneous QHS  . psyllium  1 packet Oral QPC breakfast    Continuous Infusions: . sodium chloride 75 mL/hr at 09/29/16 1100  . ertapenem 1 g (09/30/16 1351)     LOS: 2 days     Annita Brod, MD [facility] Pager [phone]  If 7PM-7AM,  please contact night-coverage [email or web address] Password [email or web address] 09/30/2016, 2:40 PM

## 2016-09-30 NOTE — Progress Notes (Signed)
Peripherally Inserted Central Catheter/Midline Placement  The IV Nurse has discussed with the patient and/or persons authorized to consent for the patient, the purpose of this procedure and the potential benefits and risks involved with this procedure.  The benefits include less needle sticks, lab draws from the catheter, and the patient may be discharged home with the catheter. Risks include, but not limited to, infection, bleeding, blood clot (thrombus formation), and puncture of an artery; nerve damage and irregular heartbeat and possibility to perform a PICC exchange if needed/ordered by physician.  Alternatives to this procedure were also discussed.  Bard Power PICC patient education guide, fact sheet on infection prevention and patient information card has been provided to patient /or left at bedside.    PICC/Midline Placement Documentation        Billy Horn 09/30/2016, 3:43 PM

## 2016-09-30 NOTE — Progress Notes (Signed)
Patient ID: Billy Horn, male   DOB: May 02, 1945, 71 y.o.   MRN: [id number]          [facility] for Infectious Disease  Date of Admission:  09/27/2016           Day 4 piperacillin tazobactam  Principal Problem:   Liver abscess Active Problems:   Diabetes mellitus type 2, uncontrolled (HCC)   Sepsis (HCC)   Anemia   . aspirin EC  81 mg Oral QPC breakfast  . docusate sodium  100 mg Oral BID  . ferrous sulfate  325 mg Oral BID WC  . insulin aspart  0-15 Units Subcutaneous TID WC  . insulin aspart  0-5 Units Subcutaneous QHS  . psyllium  1 packet Oral QPC breakfast    SUBJECTIVE: He is beginning to feel a little bit better. He is still having night sweats but he believes that his fevers and chills are improving. He feels like he has more strength and his appetite seems to be coming back.  Review of Systems: Review of Systems  Constitutional: Positive for diaphoresis, fever and weight loss. Negative for chills and malaise/fatigue.  HENT: Negative for sore throat.   Respiratory: Negative for cough, sputum production and shortness of breath.   Cardiovascular: Negative for chest pain.  Gastrointestinal: Negative for abdominal pain, diarrhea, heartburn, nausea and vomiting.  Genitourinary: Negative for dysuria and frequency.  Musculoskeletal: Negative for joint pain and myalgias.  Skin: Negative for rash.  Neurological: Negative for dizziness and headaches.    Past Medical History:  Diagnosis Date  . Cancer [facility]) 2012   prostate cancer  . Diabetes mellitus    controlled with diet and exercise    Social History  Substance Use Topics  . Smoking status: Never Smoker  . Smokeless tobacco: Never Used  . Alcohol use Yes     Comment: occas wine    Family History  Problem Relation Age of Onset  . Lung cancer Mother 41   No Known Allergies  OBJECTIVE: Vitals:   09/29/16 1537 09/29/16 1845 09/29/16 2127 09/30/16 0514  BP: (!) 153/73 (!) 162/82 (!) 143/73 123/71    Pulse: 88 88 91 67  Resp: 18 18 (!) 24 20  Temp: 98.8 F (37.1 C) 98.4 F (36.9 C) (!) 100.6 F (38.1 C) 98.3 F (36.8 C)  TempSrc: Oral Oral Oral Oral  SpO2: 98% 98% 96% 98%  Weight:      Height:       Body mass index is 22.81 kg/m.  Physical Exam  Constitutional: He is oriented to person, place, and time.  He is smiling and in good spirits. He has just finished his entire breakfast.  HENT:  Mouth/Throat: No oropharyngeal exudate.  Eyes: Conjunctivae are normal.  Cardiovascular: Normal rate and regular rhythm.   No murmur heard. Pulmonary/Chest: Effort normal and breath sounds normal.  Abdominal: Soft. He exhibits no mass. There is no tenderness.  Ms. hepatic drain bulb has been emptied twice since it was placed yesterday. Both times it was completely full.  Musculoskeletal: Normal range of motion.  Neurological: He is alert and oriented to person, place, and time.  Skin: No rash noted.  Psychiatric: Mood and affect normal.    Lab Results Lab Results  Component Value Date   WBC 12.2 (H) 09/30/2016   HGB 10.5 (L) 09/30/2016   HCT 30.6 (L) 09/30/2016   MCV 81.6 09/30/2016   PLT 302 09/30/2016    Lab Results  Component Value Date  CREATININE 1.03 09/30/2016   BUN 15 09/30/2016   NA 134 (L) 09/30/2016   K 4.6 09/30/2016   CL 103 09/30/2016   CO2 22 09/30/2016    Lab Results  Component Value Date   ALT 42 09/30/2016   AST 42 (H) 09/30/2016   ALKPHOS 166 (H) 09/30/2016   BILITOT 1.4 (H) 09/30/2016     Microbiology: Recent Results (from the past 240 hour(s))  Blood Culture (routine x 2)     Status: None (Preliminary result)   Collection Time: 09/27/16  9:05 PM  Result Value Ref Range Status   Specimen Description BLOOD BLOOD RIGHT FOREARM  Final   Special Requests   Final    BOTTLES DRAWN AEROBIC AND ANAEROBIC Blood Culture adequate volume   Culture   Final    NO GROWTH 1 DAY Performed at [facility], [place] [address] [place], [place] [address]     Report Status PENDING  Incomplete  Blood Culture (routine x 2)     Status: None (Preliminary result)   Collection Time: 09/27/16  9:35 PM  Result Value Ref Range Status   Specimen Description BLOOD RIGHT ANTECUBITAL  Final   Special Requests   Final    BOTTLES DRAWN AEROBIC AND ANAEROBIC Blood Culture adequate volume   Culture   Final    NO GROWTH 1 DAY Performed at [facility], [place] [address] [place], [place] [address]    Report Status PENDING  Incomplete  Culture, Urine     Status: Abnormal   Collection Time: 09/27/16 10:09 PM  Result Value Ref Range Status   Specimen Description URINE, CLEAN CATCH  Final   Special Requests NONE  Final   Culture MULTIPLE SPECIES PRESENT, SUGGEST RECOLLECTION (A)  Final   Report Status 09/29/2016 FINAL  Final  Aerobic/Anaerobic Culture (surgical/deep wound)     Status: None (Preliminary result)   Collection Time: 09/29/16  1:10 PM  Result Value Ref Range Status   Specimen Description ABSCESS HEPATIC DRAIN  Final   Special Requests Normal  Final   Gram Stain   Final    ABUNDANT WBC PRESENT,BOTH PMN AND MONONUCLEAR ABUNDANT GRAM POSITIVE COCCI IN CHAINS FEW GRAM VARIABLE ROD    Culture   Final    MODERATE GRAM NEGATIVE RODS IDENTIFICATION AND SUSCEPTIBILITIES TO FOLLOW Performed at [facility], [place] [address] [place], [place] [address]    Report Status PENDING  Incomplete     ASSESSMENT: His liver abscess Gram stain shows gram-positive cocci in chains and Gram variable rods compatible with the typical polymicrobial, mixed aerobic and anaerobic abscess, probably of gut origin. I will go ahead and ask for a PICC to be placed in anticipation of several weeks of IV antibiotics. I will change him to once daily ertapenem.  PLAN: 1. Change piperacillin tazobactam to ertapenem 1 g IV daily 2. PICC placement 3. Monitor drain output  Diagnosis: Liver abscess  Culture Result: Gram-positive cocci in chains and Gram variable rods  on Gram stain. Cultures pending  No Known Allergies  Discharge antibiotics: Per pharmacy protocol ertapenem 1 g IV daily  Duration: 4 weeks End Date: 10/27/2016  [facility] Care Per Protocol:  Labs weekly while on IV antibiotics: _x_ CBC with differential _x_ BMP __ CMP __ CRP __ ESR __ Vancomycin trough  __ Please pull PIC at completion of IV antibiotics _x_ Please leave PIC in place until doctor has seen patient or been notified  Fax weekly labs to [phone]  Clinic Follow Up Appt: Follow-up with me at [facility] 10/27/2016   Michel Bickers, [place] for [place] [phone] pager   [phone] cell 09/30/2016, 10:02 AM

## 2016-09-30 NOTE — Progress Notes (Signed)
    CC: Liver mass with fever   Subjective: He feels much better this AM, was able to eat last PM and sleep last PM.    Objective: Vital signs in last 24 hours: Temp:  [98.3 F (36.8 C)-100.6 F (38.1 C)] 98.3 F (36.8 C) (06/13 0514) Pulse Rate:  [67-91] 67 (06/13 0514) Resp:  [16-25] 20 (06/13 0514) BP: (123-162)/(65-82) 123/71 (06/13 0514) SpO2:  [19 %-99 %] 98 % (06/13 0514) Last BM Date: 09/28/16 NPO Drainage no recorded  Urine 400 recorded TM 100.6 WBC trending down CMP stable Intake/Output from previous day: 06/12 0701 - 06/13 0700 In: 360 [Blood:360] Out: 400 [Urine:400] Intake/Output this shift: No intake/output data recorded.  General appearance: alert, cooperative and no distress Resp: clear to auscultation bilaterally GI: soft, non-tender; bowel sounds normal; no masses,  no organomegaly and drainage is serosanguinous, he notes they emptied a whole bulb earlier this AM.  He is not tender nor distended.    Lab Results:   Recent Labs  09/29/16 0418 09/30/16 0402  WBC 14.2* 12.2*  HGB 7.7* 10.5*  HCT 23.2* 30.6*  PLT 310 302    BMET  Recent Labs  09/29/16 0418 09/30/16 0402  NA 133* 134*  K 3.8 4.6  CL 101 103  CO2 23 22  GLUCOSE 129* 114*  BUN 13 15  CREATININE 0.99 1.03  CALCIUM 8.1* 8.4*   PT/INR  Recent Labs  09/28/16 0155 09/29/16 0418  LABPROT 17.1* 16.8*  INR 1.38 1.35     Recent Labs Lab 09/27/16 2105 09/29/16 0418 09/30/16 0402  AST 47* 44* 42*  ALT 43 47 42  ALKPHOS 158* 136* 166*  BILITOT 0.6 0.8 1.4*  PROT 6.8 5.9* 6.6  ALBUMIN 2.5* 2.2* 2.4*     Lipase  No results found for: LIPASE   Medications: . aspirin EC  81 mg Oral QPC breakfast  . docusate sodium  100 mg Oral BID  . ferrous sulfate  325 mg Oral BID WC  . insulin aspart  0-15 Units Subcutaneous TID WC  . insulin aspart  0-5 Units Subcutaneous QHS  . psyllium  1 packet Oral QPC breakfast    Assessment/Plan Liver Mass 9 x 7 x 10 mm, left  lobe IR drain left liver abscess - 90 ml 09/29/16 Fever Weight loss Anemia - HBG 7.7 Hx of prostate cancer with radical prostatectomy, - Dr. Alinda Money, negative PSA hx Type II diabetes - diet/exercise controlled. FEN:  IV fluids/Regular diet ID:  Zosyn 09/27/16 =>> day 4 - Dr. Michel Bickers ID following DVT: SCD  Plan:  IR drain, ID is following, back on a diet.  Will follow with you.  Start to mobilize.  He can have DVT prophylaxis from our standpoint when OK with IR.      LOS: 2 days    Billy Horn 09/30/2016 [phone]

## 2016-09-30 NOTE — Progress Notes (Signed)
PHARMACY CONSULT NOTE FOR:  OUTPATIENT  PARENTERAL ANTIBIOTIC THERAPY (OPAT)  Indication: Liver abscess Regimen: Invanz 1gm IV q24h End date: 10/27/16  IV antibiotic discharge orders are pended. To discharging provider:  please sign these orders via discharge navigator,  Select New Orders & click on the button choice - Manage This Unsigned Work.     Thank you for allowing pharmacy to be a part of this patient's care.  Biagio Borg 09/30/2016, 10:16 AM

## 2016-09-30 NOTE — Progress Notes (Signed)
Referring Physician(s): Martin,M  Supervising Physician: Sandi Mariscal  Patient Status:  [facility] - In-pt  Chief Complaint:  Hepatic abscess  Subjective:  Pt feeling much better since hepatic drain placed yesterday; denies worsening abd pain,N/V; eating ok  Allergies: Patient has no known allergies.  Medications: Prior to Admission medications   Medication Sig Start Date End Date Taking? Authorizing Provider  acetaminophen (TYLENOL) 325 MG tablet Take 1-2 tablets (325-650 mg total) by mouth every 4 (four) hours as needed. Patient taking differently: Take 325-650 mg by mouth every 4 (four) hours as needed for mild pain, moderate pain or headache.  08/02/12  Yes Love, Ivan Anchors, PA-C  aspirin EC 81 MG tablet Take 81 mg by mouth daily after breakfast.   Yes [provider]  Calcium Carbonate (CALCIUM-CARB 600 PO) Take 1 tablet by mouth daily after breakfast.   Yes [provider]  meloxicam (MOBIC) 15 MG tablet Take 15 mg by mouth daily after breakfast. with food 08/18/16  Yes [provider]  Multiple Vitamin (MULTIVITAMIN WITH MINERALS) TABS Take 1 tablet by mouth daily after breakfast.    Yes [provider]  psyllium (METAMUCIL) 0.52 G capsule Take 1.04 g by mouth daily after breakfast.    Yes [provider]  ferrous sulfate 325 (65 FE) MG tablet Take 1 tablet (325 mg total) by mouth 2 (two) times daily with a meal. For anemia Patient not taking: Reported on 09/27/2016 08/02/12   Love, Ivan Anchors, PA-C  losartan (COZAAR) 50 MG tablet Take 1 tablet (50 mg total) by mouth daily. Patient not taking: Reported on 09/27/2016 08/02/12   Love, Ivan Anchors, PA-C  metFORMIN (GLUCOPHAGE) 500 MG tablet Take 1 tablet (500 mg total) by mouth daily with breakfast. For diabetes Patient not taking: Reported on 09/27/2016 08/02/12   Bary Leriche, PA-C     Vital Signs: BP 137/71 (BP Location: Left Arm)   Pulse 66   Temp 98.4 F (36.9 C) (Oral)   Resp 20   Ht  6' (1.829 m)   Wt 168 lb 3.4 oz (76.3 kg)   SpO2 98%   BMI 22.81 kg/m   Physical Exam hepatic drain intact, insertion site ok, NT, output about 15-20 cc turbid, bloody fluid in JP bulb; abd soft,ND  Imaging: Dg Chest 2 View  Result Date: 09/27/2016 CLINICAL DATA:  Subacute onset of worsening generalized weakness. Recently discovered liver mass. Initial encounter. EXAM: CHEST  2 VIEW COMPARISON:  Chest radiograph performed 07/25/2012 FINDINGS: The lungs are well-aerated and clear. There is no evidence of focal opacification, pleural effusion or pneumothorax. The heart is borderline normal in size. No acute osseous abnormalities are seen. IMPRESSION: No acute cardiopulmonary process seen. Electronically Signed   By: Garald Balding M.D.   On: 09/27/2016 21:33   [place] Abscess Drain  Result Date: 09/29/2016 INDICATION: Remote history of prostate cancer, now with indeterminate hepatic mass, potentially representative of a hepatic abscess. Please perform ultrasound-guided biopsy and/or drainage catheter placement. EXAM: ULTRASOUND GUIDED ABSCESS DRAINAGE COMPARISON:  Abdominal MRI - 09/18/2016; abdominal ultrasound - 09/11/2016 MEDICATIONS: The patient is currently admitted to the hospital and receiving intravenous antibiotics. The antibiotics were administered within an appropriate time frame prior to the initiation of the procedure. ANESTHESIA/SEDATION: Moderate (conscious) sedation was employed during this procedure. A total of Versed 4 mg and Fentanyl 100 mcg was administered intravenously. Moderate Sedation Time: 20 minutes. The patient's level of consciousness and vital signs were monitored continuously by radiology nursing throughout  the procedure under my direct supervision. CONTRAST:  None COMPLICATIONS: None immediate. PROCEDURE: Informed written consent was obtained from the patient after a discussion of the risks, benefits and alternatives to treatment. Ultrasound scanning was performed of the right  upper abdominal quadrant demonstrating the serpiginous hypoechoic hepatic lesion seen on preceding abdominal MRI with dominant ill-defined component measured approximately 8.4 x 6.2 cm (image 8). Fluid was noted to be swirling within the central aspect of this ill-defined serpiginous hepatic mass raising the concern this structure indeed is representative of a hepatic abscess. The midline of the upper abdomen is prepped and draped in usual sterile fashion. The procedure was planned. A timeout was performed prior to the initiation of the procedure. The overlying soft tissues were anesthetized with 1% lidocaine with epinephrine. Appropriate trajectory was planned with the use of a 22 gauge spinal needle. An 18 gauge trocar needle was advanced into the abscess/fluid collection with aspiration yielding purulent material. As such, decision was made to place a percutaneous drainage catheter. A short Amplatz super stiff wire was coiled within the collection. Appropriate positioning was confirmed with ultrasound guidance. The tract was serially dilated allowing placement of a 10 [place] all-purpose drainage catheter. Multiple ultrasound images were saved for procedural documentation purposes. Following percutaneous drainage catheter placement, approximately 90 cc of purulent, slightly blood tinged fluid was aspirated. The tube was connected to a JP bulb and sutured in place. A dressing was placed. The patient tolerated the procedure well without immediate post procedural complication. IMPRESSION: Successful ultrasound guided placement of a 10 [place] all purpose drain catheter into the hepatic abscess with aspiration of 90 cc of purulent, slightly blood tinged fluid. Samples were sent to the laboratory as requested by the ordering clinical team. Above findings were discussed with Dr. Tyrell Antonio at the time of procedure completion. PLAN: - would recommend formal ID consult. - would recommend contrast-enhanced CT of the abdomen  and pelvis to evaluate for a potential neither source of infection (such as a low-grade diverticulitis) as well as to ensure there are no additional potentially drainable hepatic abscesses. Electronically Signed   By: Sandi Mariscal M.D.   On: 09/29/2016 16:07    Labs:  CBC:  Recent Labs  09/27/16 2105 09/28/16 0435 09/29/16 0418 09/30/16 0402  WBC 15.5* 15.9* 14.2* 12.2*  HGB 7.7* 8.3* 7.7* 10.5*  HCT 23.6* 25.7* 23.2* 30.6*  PLT 373 331 310 302    COAGS:  Recent Labs  09/28/16 0155 09/29/16 0418  INR 1.38 1.35  APTT 40*  --     BMP:  Recent Labs  09/27/16 2105 09/28/16 0435 09/29/16 0418 09/30/16 0402  NA 133* 134* 133* 134*  K 4.0 4.1 3.8 4.6  CL 100* 102 101 103  CO2 25 23 23 22   GLUCOSE 179* 196* 129* 114*  BUN 17 16 13 15   CALCIUM 8.6* 8.3* 8.1* 8.4*  CREATININE 1.04 1.04 0.99 1.03  GFRNONAA >60 >60 >60 >60  GFRAA >60 >60 >60 >60    LIVER FUNCTION TESTS:  Recent Labs  09/27/16 2105 09/29/16 0418 09/30/16 0402  BILITOT 0.6 0.8 1.4*  AST 47* 44* 42*  ALT 43 47 42  ALKPHOS 158* 136* 166*  PROT 6.8 5.9* 6.6  ALBUMIN 2.5* 2.2* 2.4*    Assessment and Plan: S/p drainage of hepatic abscess 6/12; AF; WBC 12.2(14.2), hgb 10.5(7.7), creat nl; t bili 1.4; gm neg rods on prelim drain fluid cx; cont drain irrigation, output monitoring; antbx per ID; as per Dr. Yehuda Budd  note would recommend contrast-enhanced CT of the abdomen and pelvis to evaluate for a potential source of infection (such as a low-grade diverticulitis) as well as to ensure there are noadditional potentially drainable hepatic abscesses.   Electronically Signed: D. Rowe Robert, PA-C 09/30/2016, 2:24 PM   I spent a total of 15 minutes at the the patient's bedside AND on the patient's hospital floor or unit, greater than 50% of which was counseling/coordinating care for hepatic abscess drain    Patient ID: [name], male   DOB: 1945-11-18, 71 y.o.   MRN: [id number]

## 2016-09-30 NOTE — Progress Notes (Signed)
CM consult for home IV abx. This CM met with pt at bedside to offer choice for home health services and [facility] chosen. [facility] infusion rep alerted of referral. Will need HHRN order at discharge. Marney Doctor RN,BSN,NCM [phone]

## 2016-09-30 NOTE — Progress Notes (Signed)
[place] pt for [facility] this hospital admission.  [facility] will be providing HHRN and Home infusion pharmacy team for home IV ABX at DC.  [facility] hospital team will follow to support DC when ordered.  If patient discharges after hours, please call [phone].   Larry Sierras 09/30/2016, 12:30 PM

## 2016-10-01 DIAGNOSIS — B9620 Unspecified Escherichia coli [E. coli] as the cause of diseases classified elsewhere: Secondary | ICD-10-CM

## 2016-10-01 DIAGNOSIS — K750 Abscess of liver: Secondary | ICD-10-CM

## 2016-10-01 DIAGNOSIS — K5732 Diverticulitis of large intestine without perforation or abscess without bleeding: Secondary | ICD-10-CM

## 2016-10-01 DIAGNOSIS — A419 Sepsis, unspecified organism: Principal | ICD-10-CM

## 2016-10-01 DIAGNOSIS — Z95828 Presence of other vascular implants and grafts: Secondary | ICD-10-CM

## 2016-10-01 DIAGNOSIS — E1165 Type 2 diabetes mellitus with hyperglycemia: Secondary | ICD-10-CM

## 2016-10-01 DIAGNOSIS — B9689 Other specified bacterial agents as the cause of diseases classified elsewhere: Secondary | ICD-10-CM

## 2016-10-01 DIAGNOSIS — R160 Hepatomegaly, not elsewhere classified: Secondary | ICD-10-CM

## 2016-10-01 LAB — URINALYSIS, ROUTINE W REFLEX MICROSCOPIC
BILIRUBIN URINE: NEGATIVE
Glucose, UA: NEGATIVE mg/dL
Hgb urine dipstick: NEGATIVE
Ketones, ur: NEGATIVE mg/dL
LEUKOCYTES UA: NEGATIVE
Nitrite: NEGATIVE
PH: 6 (ref 5.0–8.0)
PROTEIN: NEGATIVE mg/dL
Specific Gravity, Urine: 1.013 (ref 1.005–1.030)

## 2016-10-01 LAB — COMPREHENSIVE METABOLIC PANEL
ALK PHOS: 135 U/L — AB (ref 38–126)
ALT: 29 U/L (ref 17–63)
ANION GAP: 7 (ref 5–15)
AST: 21 U/L (ref 15–41)
Albumin: 2 g/dL — ABNORMAL LOW (ref 3.5–5.0)
BUN: 9 mg/dL (ref 6–20)
CALCIUM: 8.1 mg/dL — AB (ref 8.9–10.3)
CO2: 25 mmol/L (ref 22–32)
CREATININE: 0.86 mg/dL (ref 0.61–1.24)
Chloride: 103 mmol/L (ref 101–111)
Glucose, Bld: 207 mg/dL — ABNORMAL HIGH (ref 65–99)
Potassium: 3.5 mmol/L (ref 3.5–5.1)
SODIUM: 135 mmol/L (ref 135–145)
TOTAL PROTEIN: 5.9 g/dL — AB (ref 6.5–8.1)
Total Bilirubin: 0.3 mg/dL (ref 0.3–1.2)

## 2016-10-01 LAB — CBC
HCT: 25.8 % — ABNORMAL LOW (ref 39.0–52.0)
Hemoglobin: 8.6 g/dL — ABNORMAL LOW (ref 13.0–17.0)
MCH: 27.6 pg (ref 26.0–34.0)
MCHC: 33.3 g/dL (ref 30.0–36.0)
MCV: 82.7 fL (ref 78.0–100.0)
Platelets: 346 10*3/uL (ref 150–400)
RBC: 3.12 MIL/uL — ABNORMAL LOW (ref 4.22–5.81)
RDW: 13.7 % (ref 11.5–15.5)
WBC: 8.8 10*3/uL (ref 4.0–10.5)

## 2016-10-01 LAB — GLUCOSE, CAPILLARY
GLUCOSE-CAPILLARY: 132 mg/dL — AB (ref 65–99)
GLUCOSE-CAPILLARY: 196 mg/dL — AB (ref 65–99)
Glucose-Capillary: 86 mg/dL (ref 65–99)
Glucose-Capillary: 109 mg/dL — ABNORMAL HIGH (ref 65–99)

## 2016-10-01 LAB — CEA: CEA: 1.7 ng/mL (ref 0.0–4.7)

## 2016-10-01 NOTE — Progress Notes (Signed)
CC:  Liver mass with fever  Subjective: CT obtained last PM showing the liver changes and a 5.8 x 3.9 cm contained perforation.  He has no abdominal pain and has never had abdominal pain during this course.  He has a remote hx of diverticulitis in the 80's, treated with abx as an OP.  He remembers those were very painful.  Objective: Vital signs in last 24 hours: Temp:  [98.4 F (36.9 C)-98.7 F (37.1 C)] 98.7 F (37.1 C) (06/14 0615) Pulse Rate:  [66-79] 71 (06/14 0615) Resp:  [20] 20 (06/14 0615) BP: (127-137)/(70-101) 127/70 (06/14 0615) SpO2:  [96 %-98 %] 96 % (06/14 0615) Last BM Date: 09/28/16 360 PO 675 IV 650 urine recorded Drain 10 TM 100.6 yesterday, VSS NO labs CT scan last PM:  Large complex centrally cystic mass again noted within the left hepatic lobe, measuring approximately 9.3 x 7.6 x 7.7 cm. Scattered air and fluid within the mass tracks into adjacent portions of the left hepatic lobe, raising concern for infection of an underlying hepatic metastasis, given the patient's symptoms. Associated pigtail catheter is unremarkable in appearance. 2. Acute diverticulitis noted at the mid sigmoid colon, with diffuse wall thickening and surrounding soft tissue inflammation. Trace free fluid within the pelvis. Associated contained bowel perforation noted, with an abscess containing contrast, fluid and air just superior to the bladder, measuring 5.8 x 3.9 cm. Diffuse surrounding soft tissue inflammation, with diffuse wall thickening at the dome of the bladder. 3. Mild diffuse wall thickening along the third segment of the duodenum. Would correlate for any evidence of duodenitis. 4. Scattered diverticulosis along the descending and sigmoid colon. 5. Scattered aortic atherosclerosis. 6. Cholelithiasis.  Gallbladder otherwise grossly unremarkable. 7. Scattered bilateral renal cysts, with mild left renal scarring. Nonspecific 1.6 cm isodense lesion noted at the  anterior aspect of the right kidney. This likely reflects a proteinaceous cyst, given appearance on recent prior MRI. 8. Distention of the distal tip of the appendix to 1.0 cm maximal diameter, without definite evidence of appendicitis. Contrast noted filling the remainder of the appendix.     Intake/Output from previous day: 06/13 0701 - 06/14 0700 In: 1040 [P.O.:360; IV Piggyback:675] Out: 670 [Urine:650; Drains:10] Intake/Output this shift: Total I/O In: -  Out: 200 [Urine:200]  General appearance: alert, cooperative and no distress Resp: clear to auscultation bilaterally GI: soft, non-tender; bowel sounds normal; no masses,  no organomegaly and drainage from JP down, what is in the tubing is serous, the JP empty, but what is in bulb is still serosanguinous like yesterday.  Lab Results:   Recent Labs  09/29/16 0418 09/30/16 0402  WBC 14.2* 12.2*  HGB 7.7* 10.5*  HCT 23.2* 30.6*  PLT 310 302    BMET  Recent Labs  09/29/16 0418 09/30/16 0402  NA 133* 134*  K 3.8 4.6  CL 101 103  CO2 23 22  GLUCOSE 129* 114*  BUN 13 15  CREATININE 0.99 1.03  CALCIUM 8.1* 8.4*   PT/INR  Recent Labs  09/29/16 0418  LABPROT 16.8*  INR 1.35     Recent Labs Lab 09/27/16 2105 09/29/16 0418 09/30/16 0402  AST 47* 44* 42*  ALT 43 47 42  ALKPHOS 158* 136* 166*  BILITOT 0.6 0.8 1.4*  PROT 6.8 5.9* 6.6  ALBUMIN 2.5* 2.2* 2.4*     Lipase  No results found for: LIPASE   Medications: . aspirin EC  81 mg Oral QPC breakfast  . docusate sodium  100 mg Oral BID  . ferrous sulfate  325 mg Oral BID WC  . insulin aspart  0-15 Units Subcutaneous TID WC  . insulin aspart  0-5 Units Subcutaneous QHS  . psyllium  1 packet Oral QPC breakfast  . sodium chloride flush  10-40 mL Intracatheter Q12H   . sodium chloride 75 mL/hr at 09/29/16 1100  . ertapenem Stopped (09/30/16 1548)   Anti-infectives    Start     Dose/Rate Route Frequency Ordered Stop   09/30/16 1400  ertapenem  (INVANZ) 1 g in sodium chloride 0.9 % 50 mL IVPB     1 g 100 mL/hr over 30 Minutes Intravenous Every 24 hours 09/30/16 1012     09/28/16 0600  piperacillin-tazobactam (ZOSYN) IVPB 3.375 g  Status:  Discontinued     3.375 g 12.5 mL/hr over 240 Minutes Intravenous Every 8 hours 09/28/16 0159 09/30/16 1012   09/27/16 2315  piperacillin-tazobactam (ZOSYN) IVPB 3.375 g     3.375 g 100 mL/hr over 30 Minutes Intravenous  Once 09/27/16 2309 09/28/16 0010      Assessment/Plan Liver Mass 9 x 7 x 10 mm, left lobe/MR 09/18/16 IR drain left liver abscess - 90 ml 09/29/16 CT 6/13 =>  Sigmoid diverticulitis with 5.8 x 3.9 cm abscess  Fever Weight loss Anemia - HBG 7.7 Hx of prostate cancer with radical prostatectomy, - Dr. Alinda Money, negative PSA hx Type II diabetes - diet/exercise controlled. FEN: IV fluids/Regular diet ID: Zosyn 09/27/16 =>>day 4 - Dr. Michel Bickers ID following converted to [facility]  6/13 => day 2 DVT: SCD  Plan:  He had oatmeal for breakfast this AM. I have reviewed with IR, they cannot drain the diverticular abscess, and note it is right on the bladder.  I am going to make him NPO recheck labs and UA.  Continue antibiotics.  Increase IV fluids.      LOS: 3 days    Gaylyn Berish 10/01/2016 [phone]

## 2016-10-01 NOTE — Progress Notes (Signed)
Referring Physician(s): Martin,M  Supervising Physician: Dr. Barbie Banner  Patient Status:  [facility] - In-pt  Chief Complaint:  Hepatic abscess  Subjective:  Had CT last pm, reviewed by Dr. Barbie Banner Diverticulitis with contained abscess between bowel and bladder. No colovesical sxs at this time. Abscess does appear to have air and contrast in it, indicating a fairly sizeable hole in the bowel wall. Pt seen resting in bed, denies much of any pain.  Allergies: Patient has no known allergies.  Medications:  Current Facility-Administered Medications:  .  0.9 %  sodium chloride infusion, , Intravenous, Continuous, Earnstine Regal, PA-C, Last Rate: 75 mL/hr at 09/29/16 1100 .  acetaminophen (TYLENOL) tablet 650 mg, 650 mg, Oral, Q6H PRN, 650 mg at 09/28/16 0153 **OR** acetaminophen (TYLENOL) suppository 650 mg, 650 mg, Rectal, Q6H PRN, Karmen Bongo, MD .  alum & mag hydroxide-simeth (MAALOX/MYLANTA) 200-200-20 MG/5ML suspension 30 mL, 30 mL, Oral, Q4H PRN, Regalado, Belkys A, MD .  aspirin EC tablet 81 mg, 81 mg, Oral, QPC breakfast, Karmen Bongo, MD, 81 mg at 10/01/16 1000 .  docusate sodium (COLACE) capsule 100 mg, 100 mg, Oral, BID, Karmen Bongo, MD, 100 mg at 10/01/16 1000 .  ertapenem (INVANZ) 1 g in sodium chloride 0.9 % 50 mL IVPB, 1 g, Intravenous, Q24H, Michel Bickers, MD, Stopped at 09/30/16 1548 .  ferrous sulfate tablet 325 mg, 325 mg, Oral, BID WC, Karmen Bongo, MD, 325 mg at 10/01/16 1000 .  insulin aspart (novoLOG) injection 0-15 Units, 0-15 Units, Subcutaneous, TID WC, Karmen Bongo, MD, 2 Units at 10/01/16 1006 .  insulin aspart (novoLOG) injection 0-5 Units, 0-5 Units, Subcutaneous, QHS, Karmen Bongo, MD .  iopamidol (ISOVUE-300) 61 % injection 15 mL, 15 mL, Oral, BID PRN, Annita Brod, MD .  ondansetron (ZOFRAN) tablet 4 mg, 4 mg, Oral, Q6H PRN **OR** ondansetron (ZOFRAN) injection 4 mg, 4 mg, Intravenous, Q6H PRN, Karmen Bongo, MD .  psyllium  (HYDROCIL/METAMUCIL) packet 1 packet, 1 packet, Oral, QPC breakfast, Karmen Bongo, MD, 1 packet at 10/01/16 1001 .  sodium chloride flush (NS) 0.9 % injection 10-40 mL, 10-40 mL, Intracatheter, Q12H, Michel Bickers, MD .  sodium chloride flush (NS) 0.9 % injection 10-40 mL, 10-40 mL, Intracatheter, PRN, Michel Bickers, MD    Vital Signs: BP 127/70 (BP Location: Left Arm)   Pulse 71   Temp 98.7 F (37.1 C) (Oral)   Resp 20   Ht 6' (1.829 m)   Wt 168 lb 3.4 oz (76.3 kg)   SpO2 96%   BMI 22.81 kg/m   Physical Exam  Hepatin drain intact, hazy dark yellow output in tubing/bulb Abd otherwise soft, NT  Imaging: Dg Chest 2 View  Result Date: 09/27/2016 CLINICAL DATA:  Subacute onset of worsening generalized weakness. Recently discovered liver mass. Initial encounter. EXAM: CHEST  2 VIEW COMPARISON:  Chest radiograph performed 07/25/2012 FINDINGS: The lungs are well-aerated and clear. There is no evidence of focal opacification, pleural effusion or pneumothorax. The heart is borderline normal in size. No acute osseous abnormalities are seen. IMPRESSION: No acute cardiopulmonary process seen. Electronically Signed   By: Garald Balding M.D.   On: 09/27/2016 21:33   [place] Abscess Drain  Result Date: 09/29/2016 INDICATION: Remote history of prostate cancer, now with indeterminate hepatic mass, potentially representative of a hepatic abscess. Please perform ultrasound-guided biopsy and/or drainage catheter placement. EXAM: ULTRASOUND GUIDED ABSCESS DRAINAGE COMPARISON:  Abdominal MRI - 09/18/2016; abdominal ultrasound - 09/11/2016 MEDICATIONS: The patient is currently admitted to the hospital  and receiving intravenous antibiotics. The antibiotics were administered within an appropriate time frame prior to the initiation of the procedure. ANESTHESIA/SEDATION: Moderate (conscious) sedation was employed during this procedure. A total of Versed 4 mg and Fentanyl 100 mcg was administered intravenously.  Moderate Sedation Time: 20 minutes. The patient's level of consciousness and vital signs were monitored continuously by radiology nursing throughout the procedure under my direct supervision. CONTRAST:  None COMPLICATIONS: None immediate. PROCEDURE: Informed written consent was obtained from the patient after a discussion of the risks, benefits and alternatives to treatment. Ultrasound scanning was performed of the right upper abdominal quadrant demonstrating the serpiginous hypoechoic hepatic lesion seen on preceding abdominal MRI with dominant ill-defined component measured approximately 8.4 x 6.2 cm (image 8). Fluid was noted to be swirling within the central aspect of this ill-defined serpiginous hepatic mass raising the concern this structure indeed is representative of a hepatic abscess. The midline of the upper abdomen is prepped and draped in usual sterile fashion. The procedure was planned. A timeout was performed prior to the initiation of the procedure. The overlying soft tissues were anesthetized with 1% lidocaine with epinephrine. Appropriate trajectory was planned with the use of a 22 gauge spinal needle. An 18 gauge trocar needle was advanced into the abscess/fluid collection with aspiration yielding purulent material. As such, decision was made to place a percutaneous drainage catheter. A short Amplatz super stiff wire was coiled within the collection. Appropriate positioning was confirmed with ultrasound guidance. The tract was serially dilated allowing placement of a 10 [place] all-purpose drainage catheter. Multiple ultrasound images were saved for procedural documentation purposes. Following percutaneous drainage catheter placement, approximately 90 cc of purulent, slightly blood tinged fluid was aspirated. The tube was connected to a JP bulb and sutured in place. A dressing was placed. The patient tolerated the procedure well without immediate post procedural complication. IMPRESSION: Successful  ultrasound guided placement of a 10 [place] all purpose drain catheter into the hepatic abscess with aspiration of 90 cc of purulent, slightly blood tinged fluid. Samples were sent to the laboratory as requested by the ordering clinical team. Above findings were discussed with Dr. Tyrell Antonio at the time of procedure completion. PLAN: - would recommend formal ID consult. - would recommend contrast-enhanced CT of the abdomen and pelvis to evaluate for a potential neither source of infection (such as a low-grade diverticulitis) as well as to ensure there are no additional potentially drainable hepatic abscesses. Electronically Signed   By: Sandi Mariscal M.D.   On: 09/29/2016 16:07   Ct Abdomen Pelvis W Contrast  Result Date: 09/30/2016 CLINICAL DATA:  Recently diagnosed with very large liver mass. Subacute onset of generalized fatigue and high fever. Initial encounter. EXAM: CT ABDOMEN AND PELVIS WITH CONTRAST TECHNIQUE: Multidetector CT imaging of the abdomen and pelvis was performed using the standard protocol following bolus administration of intravenous contrast. CONTRAST:  175mL ISOVUE-300 IOPAMIDOL (ISOVUE-300) INJECTION 61% COMPARISON:  MRI of the abdomen performed 09/18/2016 FINDINGS: Lower chest: Minimal atelectasis is noted at the lung bases. The visualized portions of the mediastinum are unremarkable. Hepatobiliary: A large complex centrally cystic mass is again noted within the left hepatic lobe, measuring approximately 9.3 x 7.6 x 7.7 cm. Scattered fluid and air are noted within the mass, with underlying extension of fluid into adjacent parts of the left hepatic lobe. This raises concern for infection of a hepatic metastasis, given the patient's symptoms. The associated pigtail catheter is grossly unremarkable in appearance. Stones are noted dependently within the  gallbladder. The gallbladder is otherwise grossly unremarkable. The common bile duct remains normal in caliber. Pancreas: The pancreas is within  normal limits. Spleen: The spleen is unremarkable in appearance. Adrenals/Urinary Tract: The adrenal glands are unremarkable in appearance. Scattered bilateral renal cysts are seen, with mild left renal scarring. A nonspecific 1.6 cm isodense lesion is noted at the anterior aspect of the right kidney. Nonspecific perinephric stranding is noted bilaterally. There is no evidence of hydronephrosis. A nonobstructing 8 mm stone is noted at the lower pole of the left kidney. Scattered calcification is noted about left renal cysts. No obstructing ureteral stones are seen. Stomach/Bowel: There is distention of the distal tip of the appendix to 1.0 cm in maximal diameter, though contrast is seen filling the remainder of the appendix, without definite evidence of appendicitis. There appears to be mild diffuse wall thickening along the third segment of the duodenum. Would correlate for any evidence of duodenitis. The remaining small bowel is unremarkable in appearance. The stomach is grossly unremarkable in appearance. The colon is largely filled with contrast. Scattered diverticulosis is noted along the descending and sigmoid colon. Diffuse wall thickening is noted at the mid sigmoid colon, with surrounding soft tissue inflammation, concerning for acute diverticulitis. There is apparent contained perforation noted at the mid sigmoid colon, with an abscess containing contrast, fluid and air seated superior to the bladder, measuring 5.8 x 3.9 cm. Diffuse surrounding soft tissue inflammation is noted, with diffuse wall thickening at the dome of the bladder. Trace associated free fluid is seen within the pelvis. Vascular/Lymphatic: Scattered calcification is seen along the abdominal aorta and its branches. The abdominal aorta is otherwise grossly unremarkable. The inferior vena cava is grossly unremarkable. No retroperitoneal lymphadenopathy is seen. No pelvic sidewall lymphadenopathy is identified. Reproductive: Aside from  marked soft tissue inflammation at the dome of the bladder due to the adjacent abscess, the bladder is mildly distended and otherwise grossly unremarkable. The patient is status post prostatectomy. Other: No additional soft tissue abnormalities are seen. Musculoskeletal: No acute osseous abnormalities are identified. The visualized musculature is unremarkable in appearance. IMPRESSION: 1. Large complex centrally cystic mass again noted within the left hepatic lobe, measuring approximately 9.3 x 7.6 x 7.7 cm. Scattered air and fluid within the mass tracks into adjacent portions of the left hepatic lobe, raising concern for infection of an underlying hepatic metastasis, given the patient's symptoms. Associated pigtail catheter is unremarkable in appearance. 2. Acute diverticulitis noted at the mid sigmoid colon, with diffuse wall thickening and surrounding soft tissue inflammation. Trace free fluid within the pelvis. Associated contained bowel perforation noted, with an abscess containing contrast, fluid and air just superior to the bladder, measuring 5.8 x 3.9 cm. Diffuse surrounding soft tissue inflammation, with diffuse wall thickening at the dome of the bladder. 3. Mild diffuse wall thickening along the third segment of the duodenum. Would correlate for any evidence of duodenitis. 4. Scattered diverticulosis along the descending and sigmoid colon. 5. Scattered aortic atherosclerosis. 6. Cholelithiasis.  Gallbladder otherwise grossly unremarkable. 7. Scattered bilateral renal cysts, with mild left renal scarring. Nonspecific 1.6 cm isodense lesion noted at the anterior aspect of the right kidney. This likely reflects a proteinaceous cyst, given appearance on recent prior MRI. 8. Distention of the distal tip of the appendix to 1.0 cm maximal diameter, without definite evidence of appendicitis. Contrast noted filling the remainder of the appendix. These results were called by telephone at the time of interpretation  on 09/30/2016 at 11:08 pm to  Floyce Stakes RN on [id number], who verbally acknowledged these results. Electronically Signed   By: Garald Balding M.D.   On: 09/30/2016 23:14    Labs:  CBC:  Recent Labs  09/28/16 0435 09/29/16 0418 09/30/16 0402 10/01/16 1010  WBC 15.9* 14.2* 12.2* 8.8  HGB 8.3* 7.7* 10.5* 8.6*  HCT 25.7* 23.2* 30.6* 25.8*  PLT 331 310 302 346    COAGS:  Recent Labs  09/28/16 0155 09/29/16 0418  INR 1.38 1.35  APTT 40*  --     BMP:  Recent Labs  09/28/16 0435 09/29/16 0418 09/30/16 0402 10/01/16 1010  NA 134* 133* 134* 135  K 4.1 3.8 4.6 3.5  CL 102 101 103 103  CO2 23 23 22 25   GLUCOSE 196* 129* 114* 207*  BUN 16 13 15 9   CALCIUM 8.3* 8.1* 8.4* 8.1*  CREATININE 1.04 0.99 1.03 0.86  GFRNONAA >60 >60 >60 >60  GFRAA >60 >60 >60 >60    LIVER FUNCTION TESTS:  Recent Labs  09/27/16 2105 09/29/16 0418 09/30/16 0402 10/01/16 1010  BILITOT 0.6 0.8 1.4* 0.3  AST 47* 44* 42* 21  ALT 43 47 42 29  ALKPHOS 158* 136* 166* 135*  PROT 6.8 5.9* 6.6 5.9*  ALBUMIN 2.5* 2.2* 2.4* 2.0*    Assessment and Plan: S/p drainage of hepatic abscess 6/12; AF; WBC 12.2(14.2), hgb 10.5(7.7), creat nl; t bili 1.4; gm neg rods on prelim drain fluid cx; cont drain irrigation, output monitoring; antbx per ID;  CT reviewed, new diverticulitis with contained perf/abscess. Unfortunately, this is not amenable to perc drainage at this time. Given what appears to be a sizeable hole in the bowel on CT, we have concern that a drain would not effectively manage this. D/w surgical team. IR will continue to follow   Electronically Signed: Ascencion Dike, PA-C 10/01/2016, 1:51 PM   I spent a total of 15 minutes at the the patient's bedside AND on the patient's hospital floor or unit, greater than 50% of which was counseling/coordinating care for hepatic abscess drain    Patient ID: Billy Horn, male   DOB: [birth year], 71 y.o.   MRN: [id number]

## 2016-10-01 NOTE — Progress Notes (Signed)
Nutrition Follow-up  DOCUMENTATION CODES:   Severe malnutrition in context of acute illness/injury  INTERVENTION:   Diet advancement per MD Will order [employer] Instant Breakfast once diet is advanced and tolerated  RD will continue to monitor for plan  NUTRITION DIAGNOSIS:   Malnutrition (Severe) related to acute illness, nausea, vomiting as evidenced by percent weight loss, energy intake < or equal to 50% for > or equal to 5 days.  Ongoing.  GOAL:   Patient will meet greater than or equal to 90% of their needs  Not meeting, NPO.  MONITOR:   Diet advancement, Labs, Weight trends, I & O's  ASSESSMENT:   71 y.o. male with medical history significant of remote prostate CA and diabetes (not taking medications despite A1c of 7 - prefers diet and exercise) with newly recognized 9 x 7 x 10 cm lass largely replacing the lateral segment of the left liver and extending inferiorly with a large central area of probable necrosis, with several satellite nodules highly concerning for neoplasm and with 3 cm retrocaval LN concerning for metastatic disease (diagnosed by MRI 6/1).  He has been "on about a three week siege with fatigue and high temperatures."  Saw PCP and fever was 103.  He was started on  antibiotics (Ampicillin).  He was then scheduled for a CT and then MRI a week ago Friday.  Results last week showed a tumor on his liver.  He is awaiting additional testing and to meet with Dr. Barry Dienes in all probability for surgery.  Occasionally checks temperatures at home, 102 today.  He has extreme fatigue.  Today it was the worst of his 3 weeks and he just felt like he had to come in.  He is unable to complete normal day-to-day tasks.  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  He has to lie down after being up 30 minutes, just exhausted.  No SOB.  No pain.  Weight loss, maybe 20 pounds in the last 3 weeks.  Dr. Alroy Dust thinks the tumor is cancerous but he  needs more testing before surgery.  Since initial assessment 6/11 (see below), pt's diet was advanced to regular. Pt was not consuming much, 0% meal completions most of the time. Did eat 100% of breakfast 6/13.  Per surgery note, CT scan has shown perforation. Pt with h/o diverticulitis. Pt will be NPO at this time.  Will order supplement upon diet advancement.  Medications: Colace capsule BID, Ferrous sulfate tablet BID  Labs reviewed:  CBGs: 111-132  6/11: -Patient in room with no family at bedside. Pt reports he has had poor intake and appetite since 5/15. Pt has been drinking liquids but has not been able to tolerate solid food well. Pt has tried to drink [employer] drinks at home during this time. Currently pt is NPO. Pt eager for water or ice chips. -Pt reports UBW is 188 lb, last weighed this in May. Pt has lost 11% of his UBW in 4 weeks which is significant for time frame. No depletion noted in face, unable to perform full NFPE at this time. Will attempt at follow-up.  Diet Order:  Diet NPO time specified  Skin:  Reviewed, no issues  Last BM:  6/11  Height:   Ht Readings from Last 1 Encounters:  09/28/16 6' (1.829 m)    Weight:   Wt Readings from Last 1 Encounters:  09/28/16 168 lb 3.4 oz (76.3 kg)  Ideal Body Weight:  80.9 kg  BMI:  Body mass index is 22.81 kg/m.  Estimated Nutritional Needs:   Kcal:  1900-2100  Protein:  85-95g  Fluid:  2L/day  EDUCATION NEEDS:   No education needs identified at this time  Clayton Bibles, MS, RD, LDN Pager: [phone] After Hours Pager: [phone]

## 2016-10-01 NOTE — Progress Notes (Addendum)
[facility]  PROGRESS NOTE  Billy Horn PTW:[id number] DOB: 28-Dec-1945 DOA: 09/27/2016 PCP: Alroy Dust, L.Marlou Sa, MD   Brief HPI:    71 year old male with past history of prostate CA and diet-controlled diabetes who had been diagnosed 10 days prior with a very large liver mass suspected to be tumor showing central area of necrosis and had been having several weeks of fatigue and high fever.  This was treated for a week with amoxicillin with no relief. The shortness of breath or pain only weight loss and worsening fatigue. Came to emergency room on 6/10 and found to be in sepsis attributed to liver mass.  Started on broad-spectrum antibiotics.     Subjective   Patient seen and examined, denies abdominal pain. No nausea or vomiting. CT abdomen resulted which shows acute sigmoid diverticulitis with contained perforation.   Assessment/Plan:     1. Liver abscess/mass- patient is status post percutaneous drain placement to left lower lobe abscess. Alpha-fetoprotein level and hepatitis panel are negative. Abscess culture showed moderate Escherichia coli, sensitive to Zosyn. Zosyn was changed to ertapenem as per ID. 2. Acute sigmoid diverticulitis with contained perforation- CT abdomen pelvis showed acute sigmoid diverticulitis with contained perforation, general surgery is  following. I discussed with surgery PA. Will await their recommendations. Continue Invanz 3. Diabetes mellitus- Patient is not on home medications. Hemoglobin A1c is 7.4. 4. Sepsis- resolved, patient met criteria for sepsis on admission given tachycardia, leukocytosis, abscess in the liver.    DVT prophylaxis: SCDs  Code Status: Full code  Family Communication: No family at bedside   Disposition Plan: Pending improvement in liver abscess, surgical evaluation of diverticulitis   Consultants:  Gen. Surgery  Infection disease  Interventional radiology   Procedures:  Status post hepatic percutaneous drain  placed 09/29/2016  Continuous infusions . sodium chloride 75 mL/hr at 09/29/16 1100  . ertapenem Stopped (09/30/16 1548)      Antibiotics:   Anti-infectives    Start     Dose/Rate Route Frequency Ordered Stop   09/30/16 1400  ertapenem (INVANZ) 1 g in sodium chloride 0.9 % 50 mL IVPB     1 g 100 mL/hr over 30 Minutes Intravenous Every 24 hours 09/30/16 1012     09/28/16 0600  piperacillin-tazobactam (ZOSYN) IVPB 3.375 g  Status:  Discontinued     3.375 g 12.5 mL/hr over 240 Minutes Intravenous Every 8 hours 09/28/16 0159 09/30/16 1012   09/27/16 2315  piperacillin-tazobactam (ZOSYN) IVPB 3.375 g     3.375 g 100 mL/hr over 30 Minutes Intravenous  Once 09/27/16 2309 09/28/16 0010       Objective   Vitals:   09/30/16 0514 09/30/16 1403 09/30/16 2103 10/01/16 0615  BP: 123/71 137/71 (!) 137/101 127/70  Pulse: 67 66 79 71  Resp: _0 Temp: 98.3 F (36.8 C) 98.4 F (36.9 C) 98.4 F (36.9 C) 98.7 F (37.1 C)  TempSrc: Oral Oral Oral Oral  SpO2: 98% 98% 98% 96%  Weight:      Height:        Intake/Output Summary (Last 24 hours) at 10/01/16 1403 Last data filed at 10/01/16 1145  Gross per 24 hour  Intake             1150 ml  Output             1035 ml  Net              115 ml   [employer]  09/28/16 0149  Weight: 76.3 kg (168 lb 3.4 oz)     Physical Examination:   Physical Exam: Eyes: No icterus, extraocular muscles intact  Mouth: Oral mucosa is moist, no lesions on palate,  Neck: Supple, no deformities, masses, or tenderness Lungs: Normal respiratory effort, bilateral clear to auscultation, no crackles or wheezes.  Heart: Regular rate and rhythm, S1 and S2 normal, no murmurs, rubs auscultated Abdomen: BS normoactive,soft,nondistended,non-tender to palpation,no organomegaly Extremities: No pretibial edema, no erythema, no cyanosis, no clubbing Neuro : Alert and oriented to time, place and person, No focal deficits  Skin: No rashes seen on  exam     Data Reviewed: I have personally reviewed following labs and imaging studies  CBG:  Recent Labs Lab 09/30/16 1207 09/30/16 1743 09/30/16 2203 10/01/16 0729 10/01/16 1202  GLUCAP 176* 118* 111* 132* 196*    CBC:  Recent Labs Lab 09/27/16 2105 09/28/16 0435 09/29/16 0418 09/30/16 0402 10/01/16 1010  WBC 15.5* 15.9* 14.2* 12.2* 8.8  NEUTROABS 12.5*  --  11.5*  --   --   HGB 7.7* 8.3* 7.7* 10.5* 8.6*  HCT 23.6* 25.7* 23.2* 30.6* 25.8*  MCV 81.4 82.1 83.2 81.6 82.7  PLT 373 331 310 302 062    Basic Metabolic Panel:  Recent Labs Lab 09/27/16 2105 09/28/16 0435 09/29/16 0418 09/30/16 0402 10/01/16 1010  NA 133* 134* 133* 134* 135  K 4.0 4.1 3.8 4.6 3.5  CL 100* 102 101 103 103  CO2 _0 GLUCOSE 179* 196* 129* 114* 207*  BUN _1 CREATININE 1.04 1.04 0.99 1.03 0.86  CALCIUM 8.6* 8.3* 8.1* 8.4* 8.1*    Recent Results (from the past 240 hour(s))  Blood Culture (routine x 2)     Status: None (Preliminary result)   Collection Time: 09/27/16  9:05 PM  Result Value Ref Range Status   Specimen Description BLOOD BLOOD RIGHT FOREARM  Final   Special Requests   Final    BOTTLES DRAWN AEROBIC AND ANAEROBIC Blood Culture adequate volume   Culture   Final    NO GROWTH 2 DAYS Performed at [facility], [address], [place], [place] [address]    Report Status PENDING  Incomplete  Blood Culture (routine x 2)     Status: None (Preliminary result)   Collection Time: 09/27/16  9:35 PM  Result Value Ref Range Status   Specimen Description BLOOD RIGHT ANTECUBITAL  Final   Special Requests   Final    BOTTLES DRAWN AEROBIC AND ANAEROBIC Blood Culture adequate volume   Culture   Final    NO GROWTH 2 DAYS Performed at [facility], [place] [address] [place], [place] [address]    Report Status PENDING  Incomplete  Culture, Urine     Status: Abnormal   Collection Time: 09/27/16 10:09 PM  Result Value Ref Range Status   Specimen  Description URINE, CLEAN CATCH  Final   Special Requests NONE  Final   Culture MULTIPLE SPECIES PRESENT, SUGGEST RECOLLECTION (A)  Final   Report Status 09/29/2016 FINAL  Final  Aerobic/Anaerobic Culture (surgical/deep wound)     Status: None (Preliminary result)   Collection Time: 09/29/16  1:10 PM  Result Value Ref Range Status   Specimen Description ABSCESS HEPATIC DRAIN  Final   Special Requests Normal  Final   Gram Stain   Final    ABUNDANT WBC PRESENT,BOTH PMN AND MONONUCLEAR ABUNDANT GRAM POSITIVE COCCI IN  CHAINS FEW GRAM VARIABLE ROD Performed at [facility], [place] [address], [place], [place] [address]    Culture MODERATE ESCHERICHIA COLI  Final   Report Status PENDING  Incomplete   Organism ID, Bacteria ESCHERICHIA COLI  Final      Susceptibility   Escherichia coli - MIC*    AMPICILLIN >=32 RESISTANT Resistant     CEFAZOLIN 8 SENSITIVE Sensitive     CEFEPIME <=1 SENSITIVE Sensitive     CEFTAZIDIME <=1 SENSITIVE Sensitive     CEFTRIAXONE <=1 SENSITIVE Sensitive     CIPROFLOXACIN <=0.25 SENSITIVE Sensitive     GENTAMICIN <=1 SENSITIVE Sensitive     IMIPENEM <=0.25 SENSITIVE Sensitive     TRIMETH/SULFA <=20 SENSITIVE Sensitive     AMPICILLIN/SULBACTAM >=32 RESISTANT Resistant     PIP/TAZO <=4 SENSITIVE Sensitive     Extended ESBL NEGATIVE Sensitive     * MODERATE ESCHERICHIA COLI     Liver Function Tests:  Recent Labs Lab 09/27/16 2105 09/29/16 0418 09/30/16 0402 10/01/16 1010  AST 47* 44* 42* 21  ALT 43 47 42 29  ALKPHOS 158* 136* 166* 135*  BILITOT 0.6 0.8 1.4* 0.3  PROT 6.8 5.9* 6.6 5.9*  ALBUMIN 2.5* 2.2* 2.4* 2.0*   No results for input(s): LIPASE, AMYLASE in the last 168 hours. No results for input(s): AMMONIA in the last 168 hours.  Cardiac Enzymes: No results for input(s): CKTOTAL, CKMB, CKMBINDEX, TROPONINI in the last 168 hours. BNP (last 3 results) No results for input(s): BNP in the last 8760 hours.  ProBNP (last 3 results) No results  for input(s): PROBNP in the last 8760 hours.    Studies: Ct Abdomen Pelvis W Contrast  Result Date: 09/30/2016 CLINICAL DATA:  Recently diagnosed with very large liver mass. Subacute onset of generalized fatigue and high fever. Initial encounter. EXAM: CT ABDOMEN AND PELVIS WITH CONTRAST TECHNIQUE: Multidetector CT imaging of the abdomen and pelvis was performed using the standard protocol following bolus administration of intravenous contrast. CONTRAST:  16m ISOVUE-300 IOPAMIDOL (ISOVUE-300) INJECTION 61% COMPARISON:  MRI of the abdomen performed 09/18/2016 FINDINGS: Lower chest: Minimal atelectasis is noted at the lung bases. The visualized portions of the mediastinum are unremarkable. Hepatobiliary: A large complex centrally cystic mass is again noted within the left hepatic lobe, measuring approximately 9.3 x 7.6 x 7.7 cm. Scattered fluid and air are noted within the mass, with underlying extension of fluid into adjacent parts of the left hepatic lobe. This raises concern for infection of a hepatic metastasis, given the patient's symptoms. The associated pigtail catheter is grossly unremarkable in appearance. Stones are noted dependently within the gallbladder. The gallbladder is otherwise grossly unremarkable. The common bile duct remains normal in caliber. Pancreas: The pancreas is within normal limits. Spleen: The spleen is unremarkable in appearance. Adrenals/Urinary Tract: The adrenal glands are unremarkable in appearance. Scattered bilateral renal cysts are seen, with mild left renal scarring. A nonspecific 1.6 cm isodense lesion is noted at the anterior aspect of the right kidney. Nonspecific perinephric stranding is noted bilaterally. There is no evidence of hydronephrosis. A nonobstructing 8 mm stone is noted at the lower pole of the left kidney. Scattered calcification is noted about left renal cysts. No obstructing ureteral stones are seen. Stomach/Bowel: There is distention of the distal tip  of the appendix to 1.0 cm in maximal diameter, though contrast is seen filling the remainder of the appendix, without definite evidence of appendicitis. There appears to be mild diffuse wall thickening along the  third segment of the duodenum. Would correlate for any evidence of duodenitis. The remaining small bowel is unremarkable in appearance. The stomach is grossly unremarkable in appearance. The colon is largely filled with contrast. Scattered diverticulosis is noted along the descending and sigmoid colon. Diffuse wall thickening is noted at the mid sigmoid colon, with surrounding soft tissue inflammation, concerning for acute diverticulitis. There is apparent contained perforation noted at the mid sigmoid colon, with an abscess containing contrast, fluid and air seated superior to the bladder, measuring 5.8 x 3.9 cm. Diffuse surrounding soft tissue inflammation is noted, with diffuse wall thickening at the dome of the bladder. Trace associated free fluid is seen within the pelvis. Vascular/Lymphatic: Scattered calcification is seen along the abdominal aorta and its branches. The abdominal aorta is otherwise grossly unremarkable. The inferior vena cava is grossly unremarkable. No retroperitoneal lymphadenopathy is seen. No pelvic sidewall lymphadenopathy is identified. Reproductive: Aside from marked soft tissue inflammation at the dome of the bladder due to the adjacent abscess, the bladder is mildly distended and otherwise grossly unremarkable. The patient is status post prostatectomy. Other: No additional soft tissue abnormalities are seen. Musculoskeletal: No acute osseous abnormalities are identified. The visualized musculature is unremarkable in appearance. IMPRESSION: 1. Large complex centrally cystic mass again noted within the left hepatic lobe, measuring approximately 9.3 x 7.6 x 7.7 cm. Scattered air and fluid within the mass tracks into adjacent portions of the left hepatic lobe, raising concern for  infection of an underlying hepatic metastasis, given the patient's symptoms. Associated pigtail catheter is unremarkable in appearance. 2. Acute diverticulitis noted at the mid sigmoid colon, with diffuse wall thickening and surrounding soft tissue inflammation. Trace free fluid within the pelvis. Associated contained bowel perforation noted, with an abscess containing contrast, fluid and air just superior to the bladder, measuring 5.8 x 3.9 cm. Diffuse surrounding soft tissue inflammation, with diffuse wall thickening at the dome of the bladder. 3. Mild diffuse wall thickening along the third segment of the duodenum. Would correlate for any evidence of duodenitis. 4. Scattered diverticulosis along the descending and sigmoid colon. 5. Scattered aortic atherosclerosis. 6. Cholelithiasis.  Gallbladder otherwise grossly unremarkable. 7. Scattered bilateral renal cysts, with mild left renal scarring. Nonspecific 1.6 cm isodense lesion noted at the anterior aspect of the right kidney. This likely reflects a proteinaceous cyst, given appearance on recent prior MRI. 8. Distention of the distal tip of the appendix to 1.0 cm maximal diameter, without definite evidence of appendicitis. Contrast noted filling the remainder of the appendix. These results were called by telephone at the time of interpretation on 09/30/2016 at 11:08 pm to [place] on [id number], who verbally acknowledged these results. Electronically Signed   By: Garald Balding M.D.   On: 09/30/2016 23:14    Scheduled Meds: . aspirin EC  81 mg Oral QPC breakfast  . docusate sodium  100 mg Oral BID  . ferrous sulfate  325 mg Oral BID WC  . insulin aspart  0-15 Units Subcutaneous TID WC  . insulin aspart  0-5 Units Subcutaneous QHS  . psyllium  1 packet Oral QPC breakfast  . sodium chloride flush  10-40 mL Intracatheter Q12H      Time spent: 20 min  [facility] Pager [phone]. If 7PM-7AM, please contact night-coverage at  [email or web address], Office  [phone]  password [email or web address] 10/01/2016, 2:03 PM  LOS: 3 days

## 2016-10-01 NOTE — Progress Notes (Signed)
Patient ID: Billy Horn, male   DOB: [birth year], 71 y.o.   MRN: [id number]          [facility] for Infectious Disease  Date of Admission:  09/27/2016   Total days of antibiotics 5        Day 2 Ertapenem         Principal Problem:   Liver abscess Active Problems:   Diabetes mellitus type 2, uncontrolled ([place])   Sepsis ([place])   Anemia   Sigmoid diverticulitis   . aspirin EC  81 mg Oral QPC breakfast  . docusate sodium  100 mg Oral BID  . ferrous sulfate  325 mg Oral BID WC  . insulin aspart  0-15 Units Subcutaneous TID WC  . insulin aspart  0-5 Units Subcutaneous QHS  . psyllium  1 packet Oral QPC breakfast  . sodium chloride flush  10-40 mL Intracatheter Q12H    SUBJECTIVE: He is still weak but feeling better.  Review of Systems: Review of Systems  Constitutional: Positive for malaise/fatigue and weight loss. Negative for chills, diaphoresis and fever.  Gastrointestinal: Negative for abdominal pain, constipation, diarrhea, nausea and vomiting.  Musculoskeletal: Negative for myalgias.    Past Medical History:  Diagnosis Date  . Cancer [facility]) 2012   prostate cancer  . Diabetes mellitus    controlled with diet and exercise    Social History  Substance Use Topics  . Smoking status: Never Smoker  . Smokeless tobacco: Never Used  . Alcohol use Yes     Comment: occas wine    Family History  Problem Relation Age of Onset  . Lung cancer Mother 33   No Known Allergies  OBJECTIVE: Vitals:   09/30/16 0514 09/30/16 1403 09/30/16 2103 10/01/16 0615  BP: 123/71 137/71 (!) 137/101 127/70  Pulse: 67 66 79 71  Resp: 20 20 20 20   Temp: 98.3 F (36.8 C) 98.4 F (36.9 C) 98.4 F (36.9 C) 98.7 F (37.1 C)  TempSrc: Oral Oral Oral Oral  SpO2: 98% 98% 98% 96%  Weight:      Height:       Body mass index is 22.81 kg/m.  Physical Exam  Constitutional: He is oriented to person, place, and time.  He is alert, talkative and in good spirits.  Abdominal: Soft.  There is no tenderness.  No hepatic drain output today.  Neurological: He is alert and oriented to person, place, and time.  Skin:  New right arm PICC.  Psychiatric: Mood and affect normal.    Lab Results Lab Results  Component Value Date   WBC 8.8 10/01/2016   HGB 8.6 (L) 10/01/2016   HCT 25.8 (L) 10/01/2016   MCV 82.7 10/01/2016   PLT 346 10/01/2016    Lab Results  Component Value Date   CREATININE 0.86 10/01/2016   BUN 9 10/01/2016   NA 135 10/01/2016   K 3.5 10/01/2016   CL 103 10/01/2016   CO2 25 10/01/2016    Lab Results  Component Value Date   ALT 29 10/01/2016   AST 21 10/01/2016   ALKPHOS 135 (H) 10/01/2016   BILITOT 0.3 10/01/2016     Microbiology: Recent Results (from the past 240 hour(s))  Blood Culture (routine x 2)     Status: None (Preliminary result)   Collection Time: 09/27/16  9:05 PM  Result Value Ref Range Status   Specimen Description BLOOD BLOOD RIGHT FOREARM  Final   Special Requests   Final    BOTTLES  DRAWN AEROBIC AND ANAEROBIC Blood Culture adequate volume   Culture   Final    NO GROWTH 3 DAYS Performed at [facility], [place] [address], [place], [place] [address]    Report Status PENDING  Incomplete  Blood Culture (routine x 2)     Status: None (Preliminary result)   Collection Time: 09/27/16  9:35 PM  Result Value Ref Range Status   Specimen Description BLOOD RIGHT ANTECUBITAL  Final   Special Requests   Final    BOTTLES DRAWN AEROBIC AND ANAEROBIC Blood Culture adequate volume   Culture   Final    NO GROWTH 3 DAYS Performed at [facility], [place] [address], [place], [place] [address]    Report Status PENDING  Incomplete  Culture, Urine     Status: Abnormal   Collection Time: 09/27/16 10:09 PM  Result Value Ref Range Status   Specimen Description URINE, CLEAN CATCH  Final   Special Requests NONE  Final   Culture MULTIPLE SPECIES PRESENT, SUGGEST RECOLLECTION (A)  Final   Report Status 09/29/2016 FINAL  Final    Aerobic/Anaerobic Culture (surgical/deep wound)     Status: None (Preliminary result)   Collection Time: 09/29/16  1:10 PM  Result Value Ref Range Status   Specimen Description ABSCESS HEPATIC DRAIN  Final   Special Requests Normal  Final   Gram Stain   Final    ABUNDANT WBC PRESENT,BOTH PMN AND MONONUCLEAR ABUNDANT GRAM POSITIVE COCCI IN CHAINS FEW GRAM VARIABLE ROD Performed at [facility], [place] [address] [place], [place] [address]    Culture MODERATE ESCHERICHIA COLI  Final   Report Status PENDING  Incomplete   Organism ID, Bacteria ESCHERICHIA COLI  Final      Susceptibility   Escherichia coli - MIC*    AMPICILLIN >=32 RESISTANT Resistant     CEFAZOLIN 8 SENSITIVE Sensitive     CEFEPIME <=1 SENSITIVE Sensitive     CEFTAZIDIME <=1 SENSITIVE Sensitive     CEFTRIAXONE <=1 SENSITIVE Sensitive     CIPROFLOXACIN <=0.25 SENSITIVE Sensitive     GENTAMICIN <=1 SENSITIVE Sensitive     IMIPENEM <=0.25 SENSITIVE Sensitive     TRIMETH/SULFA <=20 SENSITIVE Sensitive     AMPICILLIN/SULBACTAM >=32 RESISTANT Resistant     PIP/TAZO <=4 SENSITIVE Sensitive     Extended ESBL NEGATIVE Sensitive     * MODERATE ESCHERICHIA COLI     ASSESSMENT: A CT scan repeated yesterday did show some evidence of sigmoid diverticulitis. That is the probable source for his liver abscess. I will treat both with IV ertapenem. Abscess Gram stain shows mixed bacterial flora. Culture has grown a sensitive Escherichia coli so far.   PLAN: 1. Continue ertapenem  Michel Bickers, MD [facility] [phone] pager   [phone] cell 10/01/2016, 4:19 PM

## 2016-10-01 NOTE — Progress Notes (Signed)
CT result of abdomen and pelvic was called by Dr. Garald Balding. Patient appeared stable vital sign within patient range. No c/o of pain or respiratory distress. See result in chart for details. Dr. [place] Pink will f/u on CT result.

## 2016-10-02 LAB — CBC
HCT: 23 % — ABNORMAL LOW (ref 39.0–52.0)
HEMOGLOBIN: 7.7 g/dL — AB (ref 13.0–17.0)
MCH: 27.8 pg (ref 26.0–34.0)
MCHC: 33.5 g/dL (ref 30.0–36.0)
MCV: 83 fL (ref 78.0–100.0)
PLATELETS: 288 10*3/uL (ref 150–400)
RBC: 2.77 MIL/uL — ABNORMAL LOW (ref 4.22–5.81)
RDW: 13.8 % (ref 11.5–15.5)
WBC: 6.7 10*3/uL (ref 4.0–10.5)

## 2016-10-02 LAB — GLUCOSE, CAPILLARY
GLUCOSE-CAPILLARY: 114 mg/dL — AB (ref 65–99)
Glucose-Capillary: 103 mg/dL — ABNORMAL HIGH (ref 65–99)
Glucose-Capillary: 107 mg/dL — ABNORMAL HIGH (ref 65–99)
Glucose-Capillary: 110 mg/dL — ABNORMAL HIGH (ref 65–99)

## 2016-10-02 LAB — COMPREHENSIVE METABOLIC PANEL
ALK PHOS: 110 U/L (ref 38–126)
ALT: 21 U/L (ref 17–63)
ANION GAP: 7 (ref 5–15)
AST: 16 U/L (ref 15–41)
Albumin: 1.8 g/dL — ABNORMAL LOW (ref 3.5–5.0)
BUN: 6 mg/dL (ref 6–20)
CALCIUM: 7.6 mg/dL — AB (ref 8.9–10.3)
CO2: 24 mmol/L (ref 22–32)
Chloride: 106 mmol/L (ref 101–111)
Creatinine, Ser: 0.76 mg/dL (ref 0.61–1.24)
Glucose, Bld: 106 mg/dL — ABNORMAL HIGH (ref 65–99)
Potassium: 3.3 mmol/L — ABNORMAL LOW (ref 3.5–5.1)
Sodium: 137 mmol/L (ref 135–145)
Total Bilirubin: 0.4 mg/dL (ref 0.3–1.2)
Total Protein: 5.2 g/dL — ABNORMAL LOW (ref 6.5–8.1)

## 2016-10-02 LAB — PREALBUMIN

## 2016-10-02 NOTE — Progress Notes (Signed)
CC:  Liver mass with fever  Subjective: Still having night sweats and very weak.  Tolerating clears well.  No pain on abdominal exam   Objective: Vital signs in last 24 hours: Temp:  [98.4 F (36.9 C)-98.5 F (36.9 C)] 98.5 F (36.9 C) (06/15 0520) Pulse Rate:  [64-73] 64 (06/15 0520) Resp:  [18] 18 (06/15 0520) BP: (159-165)/(68-81) 165/68 (06/15 0520) SpO2:  [100 %] 100 % (06/15 0520) Last BM Date: 10/01/16 590 PO Urine 1150 Afebrile, VSS Prealbumin is less than 5, K+ 3.3 H/H down, WBC is normal Intake/Output from previous day: 06/14 0701 - 06/15 0700 In: 590 [P.O.:590] Out: 1150 [Urine:1150] Intake/Output this shift: Total I/O In: -  Out: 200 [Urine:200]  General appearance: alert, cooperative and no distress Resp: clear to auscultation bilaterally GI: soft, non-tender; bowel sounds normal; no masses,  no organomegaly and Drainage today is cloudy, more purulent fluid.  Lab Results:   Recent Labs  10/01/16 1010 10/02/16 0531  WBC 8.8 6.7  HGB 8.6* 7.7*  HCT 25.8* 23.0*  PLT 346 288    BMET  Recent Labs  10/01/16 1010 10/02/16 0531  NA 135 137  K 3.5 3.3*  CL 103 106  CO2 25 24  GLUCOSE 207* 106*  BUN 9 6  CREATININE 0.86 0.76  CALCIUM 8.1* 7.6*   PT/INR No results for input(s): LABPROT, INR in the last 72 hours.   Recent Labs Lab 09/27/16 2105 09/29/16 0418 09/30/16 0402 10/01/16 1010 10/02/16 0531  AST 47* 44* 42* 21 16  ALT 43 47 42 29 21  ALKPHOS 158* 136* 166* 135* 110  BILITOT 0.6 0.8 1.4* 0.3 0.4  PROT 6.8 5.9* 6.6 5.9* 5.2*  ALBUMIN 2.5* 2.2* 2.4* 2.0* 1.8*     Lipase  No results found for: LIPASE   Medications: . aspirin EC  81 mg Oral QPC breakfast  . docusate sodium  100 mg Oral BID  . ferrous sulfate  325 mg Oral BID WC  . insulin aspart  0-15 Units Subcutaneous TID WC  . insulin aspart  0-5 Units Subcutaneous QHS  . psyllium  1 packet Oral QPC breakfast  . sodium chloride flush  10-40 mL Intracatheter Q12H    . sodium chloride 1,000 mL (10/02/16 0337)  . ertapenem Stopped (10/01/16 1825)   Anti-infectives    Start     Dose/Rate Route Frequency Ordered Stop   09/30/16 1400  ertapenem (INVANZ) 1 g in sodium chloride 0.9 % 50 mL IVPB     1 g 100 mL/hr over 30 Minutes Intravenous Every 24 hours 09/30/16 1012     09/28/16 0600  piperacillin-tazobactam (ZOSYN) IVPB 3.375 g  Status:  Discontinued     3.375 g 12.5 mL/hr over 240 Minutes Intravenous Every 8 hours 09/28/16 0159 09/30/16 1012   09/27/16 2315  piperacillin-tazobactam (ZOSYN) IVPB 3.375 g     3.375 g 100 mL/hr over 30 Minutes Intravenous  Once 09/27/16 2309 09/28/16 0010      Assessment/Plan Liver Mass 9 x 7 x 10 mm, left lobe/MR 09/18/16 IR drain left liver abscess - 90 ml 09/29/16 CT 6/13 =>  Sigmoid diverticulitis with 5.8 x 3.9 cm abscess  Fever Weight loss Anemia - HBG 7.7 Hx of prostate cancer with radical prostatectomy, - Dr. Alinda Money, negative PSA hx Type II diabetes - diet/exercise controlled. FEN: IV fluids/Regular diet ID: Zosyn 09/27/16 =>>day 4 - Dr. Michel Bickers ID following converted to [facility]  6/13 => day 3 DVT: SCD  Plan:  Continue medical/ ID management.        LOS: 4 days    Billy Horn 10/02/2016 [phone]

## 2016-10-02 NOTE — Progress Notes (Signed)
Patient ID: Billy Horn, male   DOB: 1945-07-25, 71 y.o.   MRN: [id number]          [facility]  Date of Admission:  09/27/2016   Total days of antibiotics 6        Day 3 ertapenem         Principal Problem:   Liver abscess Active Problems:   Diabetes mellitus type 2, uncontrolled ([place])   Sepsis ([place])   Anemia   Sigmoid diverticulitis   . aspirin EC  81 mg Oral QPC breakfast  . docusate sodium  100 mg Oral BID  . ferrous sulfate  325 mg Oral BID WC  . insulin aspart  0-15 Units Subcutaneous TID WC  . insulin aspart  0-5 Units Subcutaneous QHS  . psyllium  1 packet Oral QPC breakfast  . sodium chloride flush  10-40 mL Intracatheter Q12H    SUBJECTIVE: He feels about the same as he did yesterday but overall is much better than he was up on admission.  Review of Systems: Review of Systems  Constitutional: Positive for malaise/fatigue and weight loss. Negative for chills, diaphoresis and fever.  Gastrointestinal: Negative for abdominal pain, constipation, diarrhea, nausea and vomiting.  Musculoskeletal: Negative for myalgias.    Past Medical History:  Diagnosis Date  . Cancer [facility]) 2012   prostate cancer  . Diabetes mellitus    controlled with diet and exercise    Social History  Substance Use Topics  . Smoking status: Never Smoker  . Smokeless tobacco: Never Used  . Alcohol use Yes     Comment: occas wine    Family History  Problem Relation Age of Onset  . Lung cancer Mother 75   No Known Allergies  OBJECTIVE: Vitals:   09/30/16 2103 10/01/16 0615 10/01/16 2138 10/02/16 0520  BP: (!) 137/101 127/70 (!) 159/81 (!) 165/68  Pulse: 79 71 73 64  Resp: 20 20 18 18   Temp: 98.4 F (36.9 C) 98.7 F (37.1 C) 98.4 F (36.9 C) 98.5 F (36.9 C)  TempSrc: Oral Oral Oral Oral  SpO2: 98% 96% 100% 100%  Weight:      Height:       Body mass index is 22.81 kg/m.  Physical Exam  Constitutional: He is oriented to person, place, and  time.  He is resting quietly in bed.  Abdominal: Soft. There is no tenderness.  No hepatic drain output recorded today. There is thicker and more purulent fluid in the drain.  Neurological: He is alert and oriented to person, place, and time.  Skin:  New right arm PICC.  Psychiatric: Mood and affect normal.    Lab Results Lab Results  Component Value Date   WBC 6.7 10/02/2016   HGB 7.7 (L) 10/02/2016   HCT 23.0 (L) 10/02/2016   MCV 83.0 10/02/2016   PLT 288 10/02/2016    Lab Results  Component Value Date   CREATININE 0.76 10/02/2016   BUN 6 10/02/2016   NA 137 10/02/2016   K 3.3 (L) 10/02/2016   CL 106 10/02/2016   CO2 24 10/02/2016    Lab Results  Component Value Date   ALT 21 10/02/2016   AST 16 10/02/2016   ALKPHOS 110 10/02/2016   BILITOT 0.4 10/02/2016     Microbiology: Recent Results (from the past 240 hour(s))  Blood Culture (routine x 2)     Status: None (Preliminary result)   Collection Time: 09/27/16  9:05 PM  Result Value  Ref Range Status   Specimen Description BLOOD BLOOD RIGHT FOREARM  Final   Special Requests   Final    BOTTLES DRAWN AEROBIC AND ANAEROBIC Blood Culture adequate volume   Culture   Final    NO GROWTH 3 DAYS Performed at [facility], [address], [place], [place] [address]    Report Status PENDING  Incomplete  Blood Culture (routine x 2)     Status: None (Preliminary result)   Collection Time: 09/27/16  9:35 PM  Result Value Ref Range Status   Specimen Description BLOOD RIGHT ANTECUBITAL  Final   Special Requests   Final    BOTTLES DRAWN AEROBIC AND ANAEROBIC Blood Culture adequate volume   Culture   Final    NO GROWTH 3 DAYS Performed at [facility], [place] [address], [place], [place] [address]    Report Status PENDING  Incomplete  Culture, Urine     Status: Abnormal   Collection Time: 09/27/16 10:09 PM  Result Value Ref Range Status   Specimen Description URINE, CLEAN CATCH  Final   Special Requests NONE   Final   Culture MULTIPLE SPECIES PRESENT, SUGGEST RECOLLECTION (A)  Final   Report Status 09/29/2016 FINAL  Final  Aerobic/Anaerobic Culture (surgical/deep wound)     Status: None (Preliminary result)   Collection Time: 09/29/16  1:10 PM  Result Value Ref Range Status   Specimen Description ABSCESS HEPATIC DRAIN  Final   Special Requests Normal  Final   Gram Stain   Final    ABUNDANT WBC PRESENT,BOTH PMN AND MONONUCLEAR ABUNDANT GRAM POSITIVE COCCI IN CHAINS FEW GRAM VARIABLE ROD    Culture   Final    MODERATE ESCHERICHIA COLI CULTURE REINCUBATED FOR BETTER GROWTH Performed at [facility], [place] [address] [place], [place] [address]    Report Status PENDING  Incomplete   Organism ID, Bacteria ESCHERICHIA COLI  Final      Susceptibility   Escherichia coli - MIC*    AMPICILLIN >=32 RESISTANT Resistant     CEFAZOLIN 8 SENSITIVE Sensitive     CEFEPIME <=1 SENSITIVE Sensitive     CEFTAZIDIME <=1 SENSITIVE Sensitive     CEFTRIAXONE <=1 SENSITIVE Sensitive     CIPROFLOXACIN <=0.25 SENSITIVE Sensitive     GENTAMICIN <=1 SENSITIVE Sensitive     IMIPENEM <=0.25 SENSITIVE Sensitive     TRIMETH/SULFA <=20 SENSITIVE Sensitive     AMPICILLIN/SULBACTAM >=32 RESISTANT Resistant     PIP/TAZO <=4 SENSITIVE Sensitive     Extended ESBL NEGATIVE Sensitive     * MODERATE ESCHERICHIA COLI     ASSESSMENT: He has sigmoid diverticulitis and a polymicrobial liver abscess. I plan on continuing ertapenem for at least 4 weeks total.  PLAN: 1. Continue ertapenem 2. Please call Dr. Talbot Grumbling [phone]) for any infectious disease questions this weekend  Michel Bickers, MD [facility] [place] [phone] pager   [phone] cell 10/02/2016, 10:46 AM

## 2016-10-02 NOTE — Progress Notes (Signed)
Referring Physician(s): Martin,M  Supervising Physician: Sandi Mariscal  Patient Status:  [facility]  Chief Complaint:  Hepatic abscess  Subjective: Pt doing ok; denies worsening abd pain,N/V; no BM; urinating ok; has ambulated; still with some sweats at night   Allergies: Patient has no known allergies.  Medications: Prior to Admission medications   Medication Sig Start Date End Date Taking? Authorizing Provider  acetaminophen (TYLENOL) 325 MG tablet Take 1-2 tablets (325-650 mg total) by mouth every 4 (four) hours as needed. Patient taking differently: Take 325-650 mg by mouth every 4 (four) hours as needed for mild pain, moderate pain or headache.  08/02/12  Yes Love, Ivan Anchors, PA-C  aspirin EC 81 MG tablet Take 81 mg by mouth daily after breakfast.   Yes [provider]  Calcium Carbonate (CALCIUM-CARB 600 PO) Take 1 tablet by mouth daily after breakfast.   Yes [provider]  meloxicam (MOBIC) 15 MG tablet Take 15 mg by mouth daily after breakfast. with food 08/18/16  Yes [provider]  Multiple Vitamin (MULTIVITAMIN WITH MINERALS) TABS Take 1 tablet by mouth daily after breakfast.    Yes [provider]  psyllium (METAMUCIL) 0.52 G capsule Take 1.04 g by mouth daily after breakfast.    Yes [provider]  ferrous sulfate 325 (65 FE) MG tablet Take 1 tablet (325 mg total) by mouth 2 (two) times daily with a meal. For anemia Patient not taking: Reported on 09/27/2016 08/02/12   Love, Ivan Anchors, PA-C  losartan (COZAAR) 50 MG tablet Take 1 tablet (50 mg total) by mouth daily. Patient not taking: Reported on 09/27/2016 08/02/12   Love, Ivan Anchors, PA-C  metFORMIN (GLUCOPHAGE) 500 MG tablet Take 1 tablet (500 mg total) by mouth daily with breakfast. For diabetes Patient not taking: Reported on 09/27/2016 08/02/12   Bary Leriche, PA-C     Vital Signs: BP (!) 156/82 (BP Location: Left Arm)   Pulse 73   Temp 98.6 F (37 C) (Oral)    Resp 16   Ht 6' (1.829 m)   Wt 168 lb 3.4 oz (76.3 kg)   SpO2 100%   BMI 22.81 kg/m   Physical Exam hepatic drain intact, output not sig; insertion site ok, not sig tender; cx- e coli/viridans strept  Imaging: [place] Abscess Drain  Result Date: 09/29/2016 INDICATION: Remote history of prostate cancer, now with indeterminate hepatic mass, potentially representative of a hepatic abscess. Please perform ultrasound-guided biopsy and/or drainage catheter placement. EXAM: ULTRASOUND GUIDED ABSCESS DRAINAGE COMPARISON:  Abdominal MRI - 09/18/2016; abdominal ultrasound - 09/11/2016 MEDICATIONS: The patient is currently admitted to the hospital and receiving intravenous antibiotics. The antibiotics were administered within an appropriate time frame prior to the initiation of the procedure. ANESTHESIA/SEDATION: Moderate (conscious) sedation was employed during this procedure. A total of Versed 4 mg and Fentanyl 100 mcg was administered intravenously. Moderate Sedation Time: 20 minutes. The patient's level of consciousness and vital signs were monitored continuously by radiology nursing throughout the procedure under my direct supervision. CONTRAST:  None COMPLICATIONS: None immediate. PROCEDURE: Informed written consent was obtained from the patient after a discussion of the risks, benefits and alternatives to treatment. Ultrasound scanning was performed of the right upper abdominal quadrant demonstrating the serpiginous hypoechoic hepatic lesion seen on preceding abdominal MRI with dominant ill-defined component measured approximately 8.4 x 6.2 cm (image 8). Fluid was noted to be swirling within the central aspect of this ill-defined serpiginous hepatic mass raising the concern this  structure indeed is representative of a hepatic abscess. The midline of the upper abdomen is prepped and draped in usual sterile fashion. The procedure was planned. A timeout was performed prior to the initiation of the procedure. The  overlying soft tissues were anesthetized with 1% lidocaine with epinephrine. Appropriate trajectory was planned with the use of a 22 gauge spinal needle. An 18 gauge trocar needle was advanced into the abscess/fluid collection with aspiration yielding purulent material. As such, decision was made to place a percutaneous drainage catheter. A short Amplatz super stiff wire was coiled within the collection. Appropriate positioning was confirmed with ultrasound guidance. The tract was serially dilated allowing placement of a 10 [place] all-purpose drainage catheter. Multiple ultrasound images were saved for procedural documentation purposes. Following percutaneous drainage catheter placement, approximately 90 cc of purulent, slightly blood tinged fluid was aspirated. The tube was connected to a JP bulb and sutured in place. A dressing was placed. The patient tolerated the procedure well without immediate post procedural complication. IMPRESSION: Successful ultrasound guided placement of a 10 [place] all purpose drain catheter into the hepatic abscess with aspiration of 90 cc of purulent, slightly blood tinged fluid. Samples were sent to the laboratory as requested by the ordering clinical team. Above findings were discussed with Dr. Tyrell Antonio at the time of procedure completion. PLAN: - would recommend formal ID consult. - would recommend contrast-enhanced CT of the abdomen and pelvis to evaluate for a potential neither source of infection (such as a low-grade diverticulitis) as well as to ensure there are no additional potentially drainable hepatic abscesses. Electronically Signed   By: Sandi Mariscal M.D.   On: 09/29/2016 16:07   Ct Abdomen Pelvis W Contrast  Result Date: 09/30/2016 CLINICAL DATA:  Recently diagnosed with very large liver mass. Subacute onset of generalized fatigue and high fever. Initial encounter. EXAM: CT ABDOMEN AND PELVIS WITH CONTRAST TECHNIQUE: Multidetector CT imaging of the abdomen and pelvis  was performed using the standard protocol following bolus administration of intravenous contrast. CONTRAST:  156mL ISOVUE-300 IOPAMIDOL (ISOVUE-300) INJECTION 61% COMPARISON:  MRI of the abdomen performed 09/18/2016 FINDINGS: Lower chest: Minimal atelectasis is noted at the lung bases. The visualized portions of the mediastinum are unremarkable. Hepatobiliary: A large complex centrally cystic mass is again noted within the left hepatic lobe, measuring approximately 9.3 x 7.6 x 7.7 cm. Scattered fluid and air are noted within the mass, with underlying extension of fluid into adjacent parts of the left hepatic lobe. This raises concern for infection of a hepatic metastasis, given the patient's symptoms. The associated pigtail catheter is grossly unremarkable in appearance. Stones are noted dependently within the gallbladder. The gallbladder is otherwise grossly unremarkable. The common bile duct remains normal in caliber. Pancreas: The pancreas is within normal limits. Spleen: The spleen is unremarkable in appearance. Adrenals/Urinary Tract: The adrenal glands are unremarkable in appearance. Scattered bilateral renal cysts are seen, with mild left renal scarring. A nonspecific 1.6 cm isodense lesion is noted at the anterior aspect of the right kidney. Nonspecific perinephric stranding is noted bilaterally. There is no evidence of hydronephrosis. A nonobstructing 8 mm stone is noted at the lower pole of the left kidney. Scattered calcification is noted about left renal cysts. No obstructing ureteral stones are seen. Stomach/Bowel: There is distention of the distal tip of the appendix to 1.0 cm in maximal diameter, though contrast is seen filling the remainder of the appendix, without definite evidence of appendicitis. There appears to be mild diffuse wall thickening along  the third segment of the duodenum. Would correlate for any evidence of duodenitis. The remaining small bowel is unremarkable in appearance. The  stomach is grossly unremarkable in appearance. The colon is largely filled with contrast. Scattered diverticulosis is noted along the descending and sigmoid colon. Diffuse wall thickening is noted at the mid sigmoid colon, with surrounding soft tissue inflammation, concerning for acute diverticulitis. There is apparent contained perforation noted at the mid sigmoid colon, with an abscess containing contrast, fluid and air seated superior to the bladder, measuring 5.8 x 3.9 cm. Diffuse surrounding soft tissue inflammation is noted, with diffuse wall thickening at the dome of the bladder. Trace associated free fluid is seen within the pelvis. Vascular/Lymphatic: Scattered calcification is seen along the abdominal aorta and its branches. The abdominal aorta is otherwise grossly unremarkable. The inferior vena cava is grossly unremarkable. No retroperitoneal lymphadenopathy is seen. No pelvic sidewall lymphadenopathy is identified. Reproductive: Aside from marked soft tissue inflammation at the dome of the bladder due to the adjacent abscess, the bladder is mildly distended and otherwise grossly unremarkable. The patient is status post prostatectomy. Other: No additional soft tissue abnormalities are seen. Musculoskeletal: No acute osseous abnormalities are identified. The visualized musculature is unremarkable in appearance. IMPRESSION: 1. Large complex centrally cystic mass again noted within the left hepatic lobe, measuring approximately 9.3 x 7.6 x 7.7 cm. Scattered air and fluid within the mass tracks into adjacent portions of the left hepatic lobe, raising concern for infection of an underlying hepatic metastasis, given the patient's symptoms. Associated pigtail catheter is unremarkable in appearance. 2. Acute diverticulitis noted at the mid sigmoid colon, with diffuse wall thickening and surrounding soft tissue inflammation. Trace free fluid within the pelvis. Associated contained bowel perforation noted, with an  abscess containing contrast, fluid and air just superior to the bladder, measuring 5.8 x 3.9 cm. Diffuse surrounding soft tissue inflammation, with diffuse wall thickening at the dome of the bladder. 3. Mild diffuse wall thickening along the third segment of the duodenum. Would correlate for any evidence of duodenitis. 4. Scattered diverticulosis along the descending and sigmoid colon. 5. Scattered aortic atherosclerosis. 6. Cholelithiasis.  Gallbladder otherwise grossly unremarkable. 7. Scattered bilateral renal cysts, with mild left renal scarring. Nonspecific 1.6 cm isodense lesion noted at the anterior aspect of the right kidney. This likely reflects a proteinaceous cyst, given appearance on recent prior MRI. 8. Distention of the distal tip of the appendix to 1.0 cm maximal diameter, without definite evidence of appendicitis. Contrast noted filling the remainder of the appendix. These results were called by telephone at the time of interpretation on 09/30/2016 at 11:08 pm to [place] on [id number], who verbally acknowledged these results. Electronically Signed   By: Garald Balding M.D.   On: 09/30/2016 23:14    Labs:  CBC:  Recent Labs  09/29/16 0418 09/30/16 0402 10/01/16 1010 10/02/16 0531  WBC 14.2* 12.2* 8.8 6.7  HGB 7.7* 10.5* 8.6* 7.7*  HCT 23.2* 30.6* 25.8* 23.0*  PLT 310 302 346 288    COAGS:  Recent Labs  09/28/16 0155 09/29/16 0418  INR 1.38 1.35  APTT 40*  --     BMP:  Recent Labs  09/29/16 0418 09/30/16 0402 10/01/16 1010 10/02/16 0531  NA 133* 134* 135 137  K 3.8 4.6 3.5 3.3*  CL 101 103 103 106  CO2 23 22 25 24   GLUCOSE 129* 114* 207* 106*  BUN 13 15 9 6   CALCIUM 8.1* 8.4* 8.1* 7.6*  CREATININE 0.99  1.03 0.86 0.76  GFRNONAA >60 >60 >60 >60  GFRAA >60 >60 >60 >60    LIVER FUNCTION TESTS:  Recent Labs  09/29/16 0418 09/30/16 0402 10/01/16 1010 10/02/16 0531  BILITOT 0.8 1.4* 0.3 0.4  AST 44* 42* 21 16  ALT 47 42 29 21  ALKPHOS 136* 166* 135* 110   PROT 5.9* 6.6 5.9* 5.2*  ALBUMIN 2.2* 2.4* 2.0* 1.8*    Assessment and Plan: S/p hepatic abscess drain 6/12; AF; WBC nl; hgb 7.7; drain fluid cx- e coli/viridans strept- antbx per ID; cont drain irrigation;  CT A/P 6/13 with sigmoid diverticulitis with assoc contained bowel perf superior to bladder- not amenable to perc drain at this time(reviewed by Dr. Pascal Lux); other plans as per [facility]/MEDICINE teams.  Electronically Signed: D. Rowe Robert, PA-C 10/02/2016, 4:01 PM   I spent a total of 15 minutes at the the patient's bedside AND on the patient's hospital floor or unit, greater than 50% of which was counseling/coordinating care for hepatic abscess drain    Patient ID: [name], male   DOB: [birth year], 71 y.o.   MRN: [id number]

## 2016-10-02 NOTE — Progress Notes (Signed)
[facility]  PROGRESS NOTE  Billy Horn XFG:[id number] DOB: 1945/12/14 DOA: 09/27/2016 PCP: Alroy Dust, L.Marlou Sa, MD   Brief HPI:    71 year old male with past history of prostate CA and diet-controlled diabetes who had been diagnosed 10 days prior with a very large liver mass suspected to be tumor showing central area of necrosis and had been having several weeks of fatigue and high fever.  This was treated for a week with amoxicillin with no relief. The shortness of breath or pain only weight loss and worsening fatigue. Came to emergency room on 6/10 and found to be in sepsis attributed to liver mass.  Started on broad-spectrum antibiotics.     Subjective   Patient denies abdominal pain, no nausea vomiting. IR could not place a drain yesterday for diverticulitis with contained abscess as it was between bowel and bladder.   Assessment/Plan:     1. Liver abscess/mass- patient is status post percutaneous drain placement to left lower lobe abscess. Alpha-fetoprotein level and hepatitis panel are negative. Abscess culture showed moderate Escherichia coli, sensitive to Zosyn. Zosyn was changed to ertapenem as per ID.ID recommends total of 4 weeks of ertapenem. Blood cultures 2 abdomen negative since 09/27/2016. We will insert PICC line. 2. Acute sigmoid diverticulitis with contained perforation- CT abdomen pelvis showed acute sigmoid diverticulitis with contained perforation, general surgery is  following.  Continue Invanz 3. Diabetes mellitus- Patient is not on home medications. Hemoglobin A1c is 7.4. Continue sliding scale insulin with NovoLog. 4. Sepsis- resolved, patient met criteria for sepsis on admission given tachycardia, leukocytosis, abscess in the liver.    DVT prophylaxis: SCDs  Code Status: Full code  Family Communication: No family at bedside   Disposition Plan: Pending improvement in liver abscess, surgical evaluation of diverticulitis   Consultants:  Gen.  Surgery  Infection disease  Interventional radiology   Procedures:  Status post hepatic percutaneous drain placed 09/29/2016  Continuous infusions . sodium chloride 1,000 mL (10/02/16 0337)  . ertapenem Stopped (10/01/16 1825)      Antibiotics:   Anti-infectives    Start     Dose/Rate Route Frequency Ordered Stop   09/30/16 1400  ertapenem (INVANZ) 1 g in sodium chloride 0.9 % 50 mL IVPB     1 g 100 mL/hr over 30 Minutes Intravenous Every 24 hours 09/30/16 1012     09/28/16 0600  piperacillin-tazobactam (ZOSYN) IVPB 3.375 g  Status:  Discontinued     3.375 g 12.5 mL/hr over 240 Minutes Intravenous Every 8 hours 09/28/16 0159 09/30/16 1012   09/27/16 2315  piperacillin-tazobactam (ZOSYN) IVPB 3.375 g     3.375 g 100 mL/hr over 30 Minutes Intravenous  Once 09/27/16 2309 09/28/16 0010       Objective   Vitals:   09/30/16 2103 10/01/16 0615 10/01/16 2138 10/02/16 0520  BP: (!) 137/101 127/70 (!) 159/81 (!) 165/68  Pulse: 79 71 73 64  Resp: '20 20 18 18  '$ Temp: 98.4 F (36.9 C) 98.7 F (37.1 C) 98.4 F (36.9 C) 98.5 F (36.9 C)  TempSrc: Oral Oral Oral Oral  SpO2: 98% 96% 100% 100%  Weight:      Height:        Intake/Output Summary (Last 24 hours) at 10/02/16 1220 Last data filed at 10/02/16 1100  Gross per 24 hour  Intake              240 ml  Output             1425  ml  Net            -1185 ml   Filed Weights   09/28/16 0149  Weight: 76.3 kg (168 lb 3.4 oz)     Physical Examination:   Physical Exam: Eyes: No icterus, extraocular muscles intact  Mouth: Oral mucosa is moist, no lesions on palate,  Neck: Supple, no deformities, masses, or tenderness Lungs: Normal respiratory effort, bilateral clear to auscultation, no crackles or wheezes.  Heart: Regular rate and rhythm, S1 and S2 normal, no murmurs, rubs auscultated Abdomen: BS normoactive,soft,nondistended,non-tender to palpation,no organomegaly Extremities: No pretibial edema, no erythema, no  cyanosis, no clubbing Neuro : Alert and oriented to time, place and person, No focal deficits  Skin: No rashes seen on exam     Data Reviewed: I have personally reviewed following labs and imaging studies  CBG:  Recent Labs Lab 10/01/16 1202 10/01/16 1706 10/01/16 2114 10/02/16 0736 10/02/16 1138  GLUCAP 196* 86 109* 114* 103*    CBC:  Recent Labs Lab 09/27/16 2105 09/28/16 0435 09/29/16 0418 09/30/16 0402 10/01/16 1010 10/02/16 0531  WBC 15.5* 15.9* 14.2* 12.2* 8.8 6.7  NEUTROABS 12.5*  --  11.5*  --   --   --   HGB 7.7* 8.3* 7.7* 10.5* 8.6* 7.7*  HCT 23.6* 25.7* 23.2* 30.6* 25.8* 23.0*  MCV 81.4 82.1 83.2 81.6 82.7 83.0  PLT 373 331 310 302 346 244    Basic Metabolic Panel:  Recent Labs Lab 09/28/16 0435 09/29/16 0418 09/30/16 0402 10/01/16 1010 10/02/16 0531  NA 134* 133* 134* 135 137  K 4.1 3.8 4.6 3.5 3.3*  CL 102 101 103 103 106  CO2 '23 23 22 25 24  '$ GLUCOSE 196* 129* 114* 207* 106*  BUN '16 13 15 9 6  '$ CREATININE 1.04 0.99 1.03 0.86 0.76  CALCIUM 8.3* 8.1* 8.4* 8.1* 7.6*    Recent Results (from the past 240 hour(s))  Blood Culture (routine x 2)     Status: None (Preliminary result)   Collection Time: 09/27/16  9:05 PM  Result Value Ref Range Status   Specimen Description BLOOD BLOOD RIGHT FOREARM  Final   Special Requests   Final    BOTTLES DRAWN AEROBIC AND ANAEROBIC Blood Culture adequate volume   Culture   Final    NO GROWTH 4 DAYS Performed at [facility], [address], [place], [place] [address]    Report Status PENDING  Incomplete  Blood Culture (routine x 2)     Status: None (Preliminary result)   Collection Time: 09/27/16  9:35 PM  Result Value Ref Range Status   Specimen Description BLOOD RIGHT ANTECUBITAL  Final   Special Requests   Final    BOTTLES DRAWN AEROBIC AND ANAEROBIC Blood Culture adequate volume   Culture   Final    NO GROWTH 4 DAYS Performed at [facility], [place] [address] [place], [place]  [address]    Report Status PENDING  Incomplete  Culture, Urine     Status: Abnormal   Collection Time: 09/27/16 10:09 PM  Result Value Ref Range Status   Specimen Description URINE, CLEAN CATCH  Final   Special Requests NONE  Final   Culture MULTIPLE SPECIES PRESENT, SUGGEST RECOLLECTION (A)  Final   Report Status 09/29/2016 FINAL  Final  Aerobic/Anaerobic Culture (surgical/deep wound)     Status: None (Preliminary result)   Collection Time: 09/29/16  1:10 PM  Result Value Ref Range Status   Specimen Description ABSCESS  HEPATIC DRAIN  Final   Special Requests Normal  Final   Gram Stain   Final    ABUNDANT WBC PRESENT,BOTH PMN AND MONONUCLEAR ABUNDANT GRAM POSITIVE COCCI IN CHAINS FEW GRAM VARIABLE ROD    Culture   Final    MODERATE ESCHERICHIA COLI ABUNDANT VIRIDANS STREPTOCOCCUS SUSCEPTIBILITIES TO FOLLOW Performed at [facility], [place] [address] [place],  [address]    Report Status PENDING  Incomplete   Organism ID, Bacteria ESCHERICHIA COLI  Final      Susceptibility   Escherichia coli - MIC*    AMPICILLIN >=32 RESISTANT Resistant     CEFAZOLIN 8 SENSITIVE Sensitive     CEFEPIME <=1 SENSITIVE Sensitive     CEFTAZIDIME <=1 SENSITIVE Sensitive     CEFTRIAXONE <=1 SENSITIVE Sensitive     CIPROFLOXACIN <=0.25 SENSITIVE Sensitive     GENTAMICIN <=1 SENSITIVE Sensitive     IMIPENEM <=0.25 SENSITIVE Sensitive     TRIMETH/SULFA <=20 SENSITIVE Sensitive     AMPICILLIN/SULBACTAM >=32 RESISTANT Resistant     PIP/TAZO <=4 SENSITIVE Sensitive     Extended ESBL NEGATIVE Sensitive     * MODERATE ESCHERICHIA COLI     Liver Function Tests:  Recent Labs Lab 09/27/16 2105 09/29/16 0418 09/30/16 0402 10/01/16 1010 10/02/16 0531  AST 47* 44* 42* 21 16  ALT 43 47 42 29 21  ALKPHOS 158* 136* 166* 135* 110  BILITOT 0.6 0.8 1.4* 0.3 0.4  PROT 6.8 5.9* 6.6 5.9* 5.2*  ALBUMIN 2.5* 2.2* 2.4* 2.0* 1.8*   No results for input(s): LIPASE, AMYLASE in the last 168 hours. No  results for input(s): AMMONIA in the last 168 hours.  Cardiac Enzymes: No results for input(s): CKTOTAL, CKMB, CKMBINDEX, TROPONINI in the last 168 hours. BNP (last 3 results) No results for input(s): BNP in the last 8760 hours.  ProBNP (last 3 results) No results for input(s): PROBNP in the last 8760 hours.    Studies: Ct Abdomen Pelvis W Contrast  Result Date: 09/30/2016 CLINICAL DATA:  Recently diagnosed with very large liver mass. Subacute onset of generalized fatigue and high fever. Initial encounter. EXAM: CT ABDOMEN AND PELVIS WITH CONTRAST TECHNIQUE: Multidetector CT imaging of the abdomen and pelvis was performed using the standard protocol following bolus administration of intravenous contrast. CONTRAST:  19m ISOVUE-300 IOPAMIDOL (ISOVUE-300) INJECTION 61% COMPARISON:  MRI of the abdomen performed 09/18/2016 FINDINGS: Lower chest: Minimal atelectasis is noted at the lung bases. The visualized portions of the mediastinum are unremarkable. Hepatobiliary: A large complex centrally cystic mass is again noted within the left hepatic lobe, measuring approximately 9.3 x 7.6 x 7.7 cm. Scattered fluid and air are noted within the mass, with underlying extension of fluid into adjacent parts of the left hepatic lobe. This raises concern for infection of a hepatic metastasis, given the patient's symptoms. The associated pigtail catheter is grossly unremarkable in appearance. Stones are noted dependently within the gallbladder. The gallbladder is otherwise grossly unremarkable. The common bile duct remains normal in caliber. Pancreas: The pancreas is within normal limits. Spleen: The spleen is unremarkable in appearance. Adrenals/Urinary Tract: The adrenal glands are unremarkable in appearance. Scattered bilateral renal cysts are seen, with mild left renal scarring. A nonspecific 1.6 cm isodense lesion is noted at the anterior aspect of the right kidney. Nonspecific perinephric stranding is noted  bilaterally. There is no evidence of hydronephrosis. A nonobstructing 8 mm stone is noted at the lower pole of the left kidney. Scattered calcification is noted about  left renal cysts. No obstructing ureteral stones are seen. Stomach/Bowel: There is distention of the distal tip of the appendix to 1.0 cm in maximal diameter, though contrast is seen filling the remainder of the appendix, without definite evidence of appendicitis. There appears to be mild diffuse wall thickening along the third segment of the duodenum. Would correlate for any evidence of duodenitis. The remaining small bowel is unremarkable in appearance. The stomach is grossly unremarkable in appearance. The colon is largely filled with contrast. Scattered diverticulosis is noted along the descending and sigmoid colon. Diffuse wall thickening is noted at the mid sigmoid colon, with surrounding soft tissue inflammation, concerning for acute diverticulitis. There is apparent contained perforation noted at the mid sigmoid colon, with an abscess containing contrast, fluid and air seated superior to the bladder, measuring 5.8 x 3.9 cm. Diffuse surrounding soft tissue inflammation is noted, with diffuse wall thickening at the dome of the bladder. Trace associated free fluid is seen within the pelvis. Vascular/Lymphatic: Scattered calcification is seen along the abdominal aorta and its branches. The abdominal aorta is otherwise grossly unremarkable. The inferior vena cava is grossly unremarkable. No retroperitoneal lymphadenopathy is seen. No pelvic sidewall lymphadenopathy is identified. Reproductive: Aside from marked soft tissue inflammation at the dome of the bladder due to the adjacent abscess, the bladder is mildly distended and otherwise grossly unremarkable. The patient is status post prostatectomy. Other: No additional soft tissue abnormalities are seen. Musculoskeletal: No acute osseous abnormalities are identified. The visualized musculature is  unremarkable in appearance. IMPRESSION: 1. Large complex centrally cystic mass again noted within the left hepatic lobe, measuring approximately 9.3 x 7.6 x 7.7 cm. Scattered air and fluid within the mass tracks into adjacent portions of the left hepatic lobe, raising concern for infection of an underlying hepatic metastasis, given the patient's symptoms. Associated pigtail catheter is unremarkable in appearance. 2. Acute diverticulitis noted at the mid sigmoid colon, with diffuse wall thickening and surrounding soft tissue inflammation. Trace free fluid within the pelvis. Associated contained bowel perforation noted, with an abscess containing contrast, fluid and air just superior to the bladder, measuring 5.8 x 3.9 cm. Diffuse surrounding soft tissue inflammation, with diffuse wall thickening at the dome of the bladder. 3. Mild diffuse wall thickening along the third segment of the duodenum. Would correlate for any evidence of duodenitis. 4. Scattered diverticulosis along the descending and sigmoid colon. 5. Scattered aortic atherosclerosis. 6. Cholelithiasis.  Gallbladder otherwise grossly unremarkable. 7. Scattered bilateral renal cysts, with mild left renal scarring. Nonspecific 1.6 cm isodense lesion noted at the anterior aspect of the right kidney. This likely reflects a proteinaceous cyst, given appearance on recent prior MRI. 8. Distention of the distal tip of the appendix to 1.0 cm maximal diameter, without definite evidence of appendicitis. Contrast noted filling the remainder of the appendix. These results were called by telephone at the time of interpretation on 09/30/2016 at 11:08 pm to [place] on [id number], who verbally acknowledged these results. Electronically Signed   By: Garald Balding M.D.   On: 09/30/2016 23:14    Scheduled Meds: . aspirin EC  81 mg Oral QPC breakfast  . docusate sodium  100 mg Oral BID  . ferrous sulfate  325 mg Oral BID WC  . insulin aspart  0-15 Units Subcutaneous TID WC   . insulin aspart  0-5 Units Subcutaneous QHS  . psyllium  1 packet Oral QPC breakfast  . sodium chloride flush  10-40 mL Intracatheter Q12H  Time spent: 20 min  [facility] Pager [phone]. If 7PM-7AM, please contact night-coverage at [email or web address], Office  [phone]  password [email or web address] 10/02/2016, 12:20 PM  LOS: 4 days

## 2016-10-03 LAB — CBC
HCT: 27.8 % — ABNORMAL LOW (ref 39.0–52.0)
Hemoglobin: 9 g/dL — ABNORMAL LOW (ref 13.0–17.0)
MCH: 27 pg (ref 26.0–34.0)
MCHC: 32.4 g/dL (ref 30.0–36.0)
MCV: 83.5 fL (ref 78.0–100.0)
PLATELETS: 447 10*3/uL — AB (ref 150–400)
RBC: 3.33 MIL/uL — ABNORMAL LOW (ref 4.22–5.81)
RDW: 13.8 % (ref 11.5–15.5)
WBC: 10.5 10*3/uL (ref 4.0–10.5)

## 2016-10-03 LAB — GLUCOSE, CAPILLARY
GLUCOSE-CAPILLARY: 148 mg/dL — AB (ref 65–99)
Glucose-Capillary: 116 mg/dL — ABNORMAL HIGH (ref 65–99)
Glucose-Capillary: 108 mg/dL — ABNORMAL HIGH (ref 65–99)
Glucose-Capillary: 144 mg/dL — ABNORMAL HIGH (ref 65–99)

## 2016-10-03 LAB — CULTURE, BLOOD (ROUTINE X 2)
Culture: NO GROWTH
Culture: NO GROWTH
SPECIAL REQUESTS: ADEQUATE
Special Requests: ADEQUATE

## 2016-10-03 LAB — BASIC METABOLIC PANEL
Anion gap: 7 (ref 5–15)
BUN: 8 mg/dL (ref 6–20)
CALCIUM: 8.3 mg/dL — AB (ref 8.9–10.3)
CO2: 26 mmol/L (ref 22–32)
CREATININE: 0.8 mg/dL (ref 0.61–1.24)
Chloride: 104 mmol/L (ref 101–111)
GFR calc Af Amer: >60 mL/min (ref >60)
GLUCOSE: 91 mg/dL (ref 65–99)
POTASSIUM: 3.9 mmol/L (ref 3.5–5.1)
SODIUM: 137 mmol/L (ref 135–145)

## 2016-10-03 MED ORDER — POTASSIUM CHLORIDE CRYS ER 20 MEQ PO TBCR
40.0000 meq | EXTENDED_RELEASE_TABLET | Freq: Once | ORAL | Status: AC
  Administered 2016-10-03: 40 meq via ORAL
  Filled 2016-10-03: qty 2

## 2016-10-03 NOTE — Progress Notes (Signed)
[facility]  PROGRESS NOTE  GERHARDT Billy Horn IHK:[id number] DOB: [birth year] DOA: 09/27/2016 PCP: Alroy Dust, L.Marlou Sa, MD   Brief HPI:    71 year old male with past history of prostate CA and diet-controlled diabetes who had been diagnosed 10 days prior with a very large liver mass suspected to be tumor showing central area of necrosis and had been having several weeks of fatigue and high fever.  This was treated for a week with amoxicillin with no relief. The shortness of breath or pain only weight loss and worsening fatigue. Came to emergency room on 6/10 and found to be in sepsis attributed to liver mass.  Started on broad-spectrum antibiotics.  IR could not  drain  diverticulitis with contained abscess as it was between bowel and bladder.  Subjective   Patient seen and examined, denies abdominal pain. No nausea and vomiting. Tolerating by mouth diet.   Assessment/Plan:     1. Liver abscess/mass- patient is status post percutaneous drain placement to left lower lobe abscess. Alpha-fetoprotein level and hepatitis panel are negative. Abscess culture showed moderate Escherichia coli, sensitive to Zosyn. Zosyn was changed to ertapenem as per ID.ID recommends total of 4 weeks of ertapenem. Blood cultures 2 abdomen negative since 09/27/2016.  2. Acute sigmoid diverticulitis with contained perforation- CT abdomen pelvis showed acute sigmoid diverticulitis with contained perforation,IR could not drain the abscess as it was not amenable to percutaneous drainage. general surgery is  following.  Continue Invanz. He will need total 14 weeks of ertapenem. Patient's diet advance to full liquids. 3. Hypokalemia- will replace potassium and check BMP today. 4. Anemia-patient's last hemoglobin on 6:15 was 7.7. Repeat CBC today. Likely anemia from dilution. He is +7 L in the hospital. Recurrent on the fluids to [facility]. 5. Diabetes mellitus- Patient is not on home medications. Hemoglobin A1c is 7.4. Continue  sliding scale insulin with NovoLog.Blood glucose is 116, 110, 107. 6. Sepsis- resolved, patient met criteria for sepsis on admission given tachycardia, leukocytosis, abscess in the liver.    DVT prophylaxis: SCDs  Code Status: Full code  Family Communication: No family at bedside   Disposition Plan: Likely home in next 24 hours   Consultants:  Gen. Surgery  Infection disease  Interventional radiology   Procedures:  Status post hepatic percutaneous drain placed 09/29/2016  Continuous infusions . sodium chloride 100 mL/hr at 10/03/16 0932  . ertapenem Stopped (10/02/16 1558)      Antibiotics:   Anti-infectives    Start     Dose/Rate Route Frequency Ordered Stop   09/30/16 1400  ertapenem (INVANZ) 1 g in sodium chloride 0.9 % 50 mL IVPB     1 g 100 mL/hr over 30 Minutes Intravenous Every 24 hours 09/30/16 1012     09/28/16 0600  piperacillin-tazobactam (ZOSYN) IVPB 3.375 g  Status:  Discontinued     3.375 g 12.5 mL/hr over 240 Minutes Intravenous Every 8 hours 09/28/16 0159 09/30/16 1012   09/27/16 2315  piperacillin-tazobactam (ZOSYN) IVPB 3.375 g     3.375 g 100 mL/hr over 30 Minutes Intravenous  Once 09/27/16 2309 09/28/16 0010       Objective   Vitals:   10/02/16 0520 10/02/16 1342 10/02/16 2038 10/03/16 0520  BP: (!) 165/68 (!) 156/82 (!) 158/77 (!) 160/83  Pulse: 64 73 63 69  Resp: '18 16 16 16  '$ Temp: 98.5 F (36.9 C) 98.6 F (37 C) 98 F (36.7 C) 97.9 F (36.6 C)  TempSrc: Oral Oral Oral Oral  SpO2: 100% 100%  99% 100%  Weight:      Height:        Intake/Output Summary (Last 24 hours) at 10/03/16 1059 Last data filed at 10/03/16 1005  Gross per 24 hour  Intake          9390.83 ml  Output             2840 ml  Net          6550.83 ml   Filed Weights   09/28/16 0149  Weight: 76.3 kg (168 lb 3.4 oz)     Physical Examination:   Physical Exam: Eyes: No icterus, extraocular muscles intact  Mouth: Oral mucosa is moist, no lesions on  palate,  Neck: Supple, no deformities, masses, or tenderness Lungs: Normal respiratory effort, bilateral clear to auscultation, no crackles or wheezes.  Heart: Regular rate and rhythm, S1 and S2 normal, no murmurs, rubs auscultated Abdomen: BS normoactive,soft,nondistended,non-tender to palpation,no organomegaly Extremities: No pretibial edema, no erythema, no cyanosis, no clubbing Neuro : Alert and oriented to time, place and person, No focal deficits  Skin: No rashes seen on exam     Data Reviewed: I have personally reviewed following labs and imaging studies  CBG:  Recent Labs Lab 10/02/16 0736 10/02/16 1138 10/02/16 1717 10/02/16 2036 10/03/16 1000  GLUCAP 114* 103* 107* 110* 116*    CBC:  Recent Labs Lab 09/27/16 2105 09/28/16 0435 09/29/16 0418 09/30/16 0402 10/01/16 1010 10/02/16 0531  WBC 15.5* 15.9* 14.2* 12.2* 8.8 6.7  NEUTROABS 12.5*  --  11.5*  --   --   --   HGB 7.7* 8.3* 7.7* 10.5* 8.6* 7.7*  HCT 23.6* 25.7* 23.2* 30.6* 25.8* 23.0*  MCV 81.4 82.1 83.2 81.6 82.7 83.0  PLT 373 331 310 302 346 580    Basic Metabolic Panel:  Recent Labs Lab 09/28/16 0435 09/29/16 0418 09/30/16 0402 10/01/16 1010 10/02/16 0531  NA 134* 133* 134* 135 137  K 4.1 3.8 4.6 3.5 3.3*  CL 102 101 103 103 106  CO2 '23 23 22 25 24  '$ GLUCOSE 196* 129* 114* 207* 106*  BUN '16 13 15 9 6  '$ CREATININE 1.04 0.99 1.03 0.86 0.76  CALCIUM 8.3* 8.1* 8.4* 8.1* 7.6*    Recent Results (from the past 240 hour(s))  Blood Culture (routine x 2)     Status: None (Preliminary result)   Collection Time: 09/27/16  9:05 PM  Result Value Ref Range Status   Specimen Description BLOOD BLOOD RIGHT FOREARM  Final   Special Requests   Final    BOTTLES DRAWN AEROBIC AND ANAEROBIC Blood Culture adequate volume   Culture   Final    NO GROWTH 4 DAYS Performed at [facility], [address], [place], [place] [address]    Report Status PENDING  Incomplete  Blood Culture (routine x 2)      Status: None (Preliminary result)   Collection Time: 09/27/16  9:35 PM  Result Value Ref Range Status   Specimen Description BLOOD RIGHT ANTECUBITAL  Final   Special Requests   Final    BOTTLES DRAWN AEROBIC AND ANAEROBIC Blood Culture adequate volume   Culture   Final    NO GROWTH 4 DAYS Performed at [facility], [place] [address], [place], [place] [address]    Report Status PENDING  Incomplete  Culture, Urine     Status: Abnormal   Collection Time: 09/27/16 10:09 PM  Result Value Ref Range Status   Specimen Description URINE,  CLEAN CATCH  Final   Special Requests NONE  Final   Culture MULTIPLE SPECIES PRESENT, SUGGEST RECOLLECTION (A)  Final   Report Status 09/29/2016 FINAL  Final  Aerobic/Anaerobic Culture (surgical/deep wound)     Status: None (Preliminary result)   Collection Time: 09/29/16  1:10 PM  Result Value Ref Range Status   Specimen Description ABSCESS HEPATIC DRAIN  Final   Special Requests Normal  Final   Gram Stain   Final    ABUNDANT WBC PRESENT,BOTH PMN AND MONONUCLEAR ABUNDANT GRAM POSITIVE COCCI IN CHAINS FEW GRAM VARIABLE ROD    Culture   Final    MODERATE ESCHERICHIA COLI ABUNDANT VIRIDANS STREPTOCOCCUS SUSCEPTIBILITIES TO FOLLOW Performed at [facility], [place] [address] [place], [place] [address]    Report Status PENDING  Incomplete   Organism ID, Bacteria ESCHERICHIA COLI  Final      Susceptibility   Escherichia coli - MIC*    AMPICILLIN >=32 RESISTANT Resistant     CEFAZOLIN 8 SENSITIVE Sensitive     CEFEPIME <=1 SENSITIVE Sensitive     CEFTAZIDIME <=1 SENSITIVE Sensitive     CEFTRIAXONE <=1 SENSITIVE Sensitive     CIPROFLOXACIN <=0.25 SENSITIVE Sensitive     GENTAMICIN <=1 SENSITIVE Sensitive     IMIPENEM <=0.25 SENSITIVE Sensitive     TRIMETH/SULFA <=20 SENSITIVE Sensitive     AMPICILLIN/SULBACTAM >=32 RESISTANT Resistant     PIP/TAZO <=4 SENSITIVE Sensitive     Extended ESBL NEGATIVE Sensitive     * MODERATE ESCHERICHIA COLI      Liver Function Tests:  Recent Labs Lab 09/27/16 2105 09/29/16 0418 09/30/16 0402 10/01/16 1010 10/02/16 0531  AST 47* 44* 42* 21 16  ALT 43 47 42 29 21  ALKPHOS 158* 136* 166* 135* 110  BILITOT 0.6 0.8 1.4* 0.3 0.4  PROT 6.8 5.9* 6.6 5.9* 5.2*  ALBUMIN 2.5* 2.2* 2.4* 2.0* 1.8*   No results for input(s): LIPASE, AMYLASE in the last 168 hours. No results for input(s): AMMONIA in the last 168 hours.  Cardiac Enzymes: No results for input(s): CKTOTAL, CKMB, CKMBINDEX, TROPONINI in the last 168 hours. BNP (last 3 results) No results for input(s): BNP in the last 8760 hours.  ProBNP (last 3 results) No results for input(s): PROBNP in the last 8760 hours.    Studies: No results found.  Scheduled Meds: . aspirin EC  81 mg Oral QPC breakfast  . docusate sodium  100 mg Oral BID  . ferrous sulfate  325 mg Oral BID WC  . insulin aspart  0-15 Units Subcutaneous TID WC  . insulin aspart  0-5 Units Subcutaneous QHS  . psyllium  1 packet Oral QPC breakfast  . sodium chloride flush  10-40 mL Intracatheter Q12H      Time spent: 20 min  [facility] Pager [phone]. If 7PM-7AM, please contact night-coverage at [email or web address], Office  [phone]  password [email or web address] 10/03/2016, 10:59 AM  LOS: 5 days

## 2016-10-03 NOTE — Progress Notes (Signed)
CC:  Liver abscess  Subjective: S/p perc drain. He denies abd pain  Objective: Vital signs in last 24 hours: Temp:  [97.9 F (36.6 C)-98.6 F (37 C)] 97.9 F (36.6 C) (06/16 0520) Pulse Rate:  [63-73] 69 (06/16 0520) Resp:  [16] 16 (06/16 0520) BP: (156-160)/(77-83) 160/83 (06/16 0520) SpO2:  [99 %-100 %] 100 % (06/16 0520) Last BM Date: 10/01/16    Intake/Output from previous day: 06/15 0701 - 06/16 0700 In: 820 [P.O.:810] Out: 2985 [Urine:2950; Drains:35] Intake/Output this shift: No intake/output data recorded.  General appearance: alert, cooperative and no distress Resp: clear to auscultation bilaterally GI: soft, non-tender; bowel sounds normal; no masses Liver drain in placed  Lab Results:   Recent Labs  10/01/16 1010 10/02/16 0531  WBC 8.8 6.7  HGB 8.6* 7.7*  HCT 25.8* 23.0*  PLT 346 288    BMET  Recent Labs  10/01/16 1010 10/02/16 0531  NA 135 137  K 3.5 3.3*  CL 103 106  CO2 25 24  GLUCOSE 207* 106*  BUN 9 6  CREATININE 0.86 0.76  CALCIUM 8.1* 7.6*   PT/INR No results for input(s): LABPROT, INR in the last 72 hours.   Recent Labs Lab 09/27/16 2105 09/29/16 0418 09/30/16 0402 10/01/16 1010 10/02/16 0531  AST 47* 44* 42* 21 16  ALT 43 47 42 29 21  ALKPHOS 158* 136* 166* 135* 110  BILITOT 0.6 0.8 1.4* 0.3 0.4  PROT 6.8 5.9* 6.6 5.9* 5.2*  ALBUMIN 2.5* 2.2* 2.4* 2.0* 1.8*     Lipase  No results found for: LIPASE   Medications: . aspirin EC  81 mg Oral QPC breakfast  . docusate sodium  100 mg Oral BID  . ferrous sulfate  325 mg Oral BID WC  . insulin aspart  0-15 Units Subcutaneous TID WC  . insulin aspart  0-5 Units Subcutaneous QHS  . psyllium  1 packet Oral QPC breakfast  . sodium chloride flush  10-40 mL Intracatheter Q12H   . sodium chloride 100 mL/hr at 10/02/16 1242  . ertapenem Stopped (10/02/16 1558)   Anti-infectives    Start     Dose/Rate Route Frequency Ordered Stop   09/30/16 1400  ertapenem  (INVANZ) 1 g in sodium chloride 0.9 % 50 mL IVPB     1 g 100 mL/hr over 30 Minutes Intravenous Every 24 hours 09/30/16 1012     09/28/16 0600  piperacillin-tazobactam (ZOSYN) IVPB 3.375 g  Status:  Discontinued     3.375 g 12.5 mL/hr over 240 Minutes Intravenous Every 8 hours 09/28/16 0159 09/30/16 1012   09/27/16 2315  piperacillin-tazobactam (ZOSYN) IVPB 3.375 g     3.375 g 100 mL/hr over 30 Minutes Intravenous  Once 09/27/16 2309 09/28/16 0010      Assessment/Plan Liver Mass 9 x 7 x 10 mm, left lobe/MR 09/18/16 IR drain left liver abscess - 90 ml 09/29/16 CT 6/13 =>  Sigmoid diverticulitis with large diverticulum over the bladder dome  Fever Weight loss Anemia - HBG 7.7 Hx of prostate cancer with radical prostatectomy, - Dr. Alinda Money, negative PSA hx Type II diabetes - diet/exercise controlled. FEN: IV fluids/Regular diet ID: [email or web address] 09/27/16 =>>day 4 - Dr. Michel Bickers ID following converted to [facility]  6/13 => day 3 DVT: SCD  Plan:  He seems to be doing well after drain placement.  Colon is assumed source.  Will need f/u apt with Dr Oletta Lamas in 4-6 wks to discuss repeating his colonoscopy.  We have  recommended sigmoidectomy after that to prevent further infections.        LOS: 5 days    Belal Scallon C. 0/34/0352

## 2016-10-04 LAB — GLUCOSE, CAPILLARY
GLUCOSE-CAPILLARY: 116 mg/dL — AB (ref 65–99)
Glucose-Capillary: 162 mg/dL — ABNORMAL HIGH (ref 65–99)

## 2016-10-04 LAB — AEROBIC/ANAEROBIC CULTURE W GRAM STAIN (SURGICAL/DEEP WOUND): Special Requests: NORMAL

## 2016-10-04 LAB — AEROBIC/ANAEROBIC CULTURE (SURGICAL/DEEP WOUND)

## 2016-10-04 MED ORDER — ERTAPENEM IV (FOR PTA / DISCHARGE USE ONLY): 1.0000 g | INTRAVENOUS | 0 refills | Status: DC

## 2016-10-04 MED ORDER — DOCUSATE SODIUM 100 MG PO CAPS: 100.0000 mg | ORAL_CAPSULE | Freq: Two times a day (BID) | ORAL | 2 refills | Status: DC

## 2016-10-04 NOTE — Progress Notes (Signed)
May administer INVANCE at 1100  Today per Dr Darrick Meigs

## 2016-10-04 NOTE — Discharge Summary (Addendum)
Physician Discharge Summary  Billy Horn ZOX:[id number] DOB: 06/02/1945 DOA: 09/27/2016  PCP: Alroy Dust, L.Marlou Sa, MD  Admit date: 09/27/2016 Discharge date: 10/04/2016  Time spent: 35* minutes  Recommendations for Outpatient Follow-up:  1. Follow-up Gastroenterology in 2 weeks 2. Follow up PCP in one week-   Discharge Diagnoses:  Principal Problem:   Liver abscess Active Problems:   Diabetes mellitus type 2, uncontrolled ([place])   Sepsis ([place])   Anemia   Sigmoid diverticulitis   Discharge Condition: Stable  Diet recommendation: Regular diet  Filed Weights   09/28/16 0149  Weight: 76.3 kg (168 lb 3.4 oz)    History of present illness:  71 year old male with past history of prostate CA and diet-controlled diabetes who had been diagnosed 10 days prior with a very large liver mass suspected to be tumor showing central area of necrosis and had been having several weeks of fatigue and high fever. This was treated for a week with amoxicillin with no relief. The shortness of breath or pain only weight loss and worsening fatigue. Came to emergency room on 6/10 and found to be in sepsis attributed to liver mass. Started on broad-spectrum antibiotics.  IR could not  drain  diverticulitis with contained abscess as it was between bowel and bladder.  Hospital Course:  1. Liver abscess/mass- patient is status post percutaneous drain placement to left lower lobe abscess. Alpha-fetoprotein level and hepatitis panel are negative. Abscess culture showed moderate Escherichia coli, sensitive to Zosyn. Zosyn was changed to ertapenem as per ID.ID recommends total of 4 weeks of ertapenem. Blood cultures 2 abdomen negative since 09/27/2016. Home health has been changed to provide IV antibiotics. Stop date for antibiotics 10/28/2016. 2. Acute sigmoid diverticulitis with contained perforation- CT abdomen pelvis showed acute sigmoid diverticulitis with contained perforation,IR could not drain the abscess as  it was not amenable to percutaneous drainage. He denies pain, tolerating diet well .General surgery recommends follow-up with gastroenterology in 2-3 weeks for outpatient colonoscopy.  Continue Invanz.  patient advised for sigmoidectomy  in near future to prevent future attacks of diverticulitis. 3. Hypokalemia- replete. 4. Anemia-patient's last hemoglobin on 615 was 7.7. Repeat CBC yesterday showed hemoglobin of 9.0. Likely anemia from dilution.  5. Diabetes mellitus- Patient is not on home medications. Hemoglobin A1c is 7.4. Continue sliding scale insulin with NovoLog. continue metformin as outpatient.  6. Sepsis- resolved, patient met criteria for sepsis on admission given tachycardia, leukocytosis, abscess in the liver  Procedures:  CT-guided drainage of the liver abscess with percutaneous drain placement   Consultations:  IR  infectious disease  General surgery  Discharge Exam: Vitals:   10/03/16 2134 10/04/16 0557  BP: (!) 158/76 (!) 143/79  Pulse: 63 64  Resp: 18 20  Temp: 98.6 F (37 C) 98.4 F (36.9 C)    General: Appears in no acute distress Cardiovascular:  S1-S2, regular Respiratory: clear to auscultation bilaterally Abdomen- soft, nontender, no organomegaly  Discharge Instructions   Discharge Instructions    Diet - low sodium heart healthy    Complete by:  As directed    Home infusion instructions [facility] May follow [place] Dosing Protocol; May administer Cathflo as needed to maintain patency of vascular access device.; Flushing of vascular access device: per [facility] Protocol: 0.9% NaCl pre/post medica...    Complete by:  As directed    Instructions:  May follow [place] Dosing Protocol   Instructions:  May administer Cathflo as needed to maintain patency of vascular access device.  Instructions:  Flushing of vascular access device: per [facility] Protocol: 0.9% NaCl pre/post medication administration and prn patency; Heparin 100 u/ml, 47m for  implanted ports and Heparin 10u/ml, 534mfor all other central venous catheters.   Instructions:  May follow [facility] Anaphylaxis Protocol for First Dose Administration in the home: 0.9% NaCl at 25-50 ml/hr to maintain IV access for protocol meds. Epinephrine 0.3 ml IV/IM PRN and Benadryl 25-50 IV/IM PRN s/s of anaphylaxis.   Instructions:  [facility]fusion Coordinator (RN) to assist per patient IV care needs in the home PRN.   Increase activity slowly    Complete by:  As directed      Current Discharge Medication List    START taking these medications   Details  docusate sodium (COLACE) 100 MG capsule Take 1 capsule (100 mg total) by mouth 2 (two) times daily. Qty: 60 capsule, Refills: 2    ertapenem (INVANZ) IVPB Inject 1 g into the vein daily. Indication:  Liver abscess Last Day of Therapy:  10/27/16 Labs - Once weekly:  CBC/D and BMP, Labs - Every other week:  ESR and CRP Qty: 27 Units, Refills: 0      CONTINUE these medications which have NOT CHANGED   Details  acetaminophen (TYLENOL) 325 MG tablet Take 1-2 tablets (325-650 mg total) by mouth every 4 (four) hours as needed.    aspirin EC 81 MG tablet Take 81 mg by mouth daily after breakfast.    Calcium Carbonate (CALCIUM-CARB 600 PO) Take 1 tablet by mouth daily after breakfast.    Multiple Vitamin (MULTIVITAMIN WITH MINERALS) TABS Take 1 tablet by mouth daily after breakfast.     psyllium (METAMUCIL) 0.52 G capsule Take 1.04 g by mouth daily after breakfast.     ferrous sulfate 325 (65 FE) MG tablet Take 1 tablet (325 mg total) by mouth 2 (two) times daily with a meal. For anemia Qty: 60 tablet, Refills: 0    losartan (COZAAR) 50 MG tablet Take 1 tablet (50 mg total) by mouth daily. Qty: 30 tablet, Refills: 1    metFORMIN (GLUCOPHAGE) 500 MG tablet Take 1 tablet (500 mg total) by mouth daily with breakfast. For diabetes Qty: 30 tablet, Refills: 1      STOP taking these medications     meloxicam (MOBIC) 15 MG  tablet        No Known Allergies Follow-up Information    [facility]-Home Follow up.   Why:  Home Health RN Contact information: [address] [place] [phone]          The results of significant diagnostics from this hospitalization (including imaging, microbiology, ancillary and laboratory) are listed below for reference.    Significant Diagnostic Studies: Dg Chest 2 View  Result Date: 09/27/2016 CLINICAL DATA:  Subacute onset of worsening generalized weakness. Recently discovered liver mass. Initial encounter. EXAM: CHEST  2 VIEW COMPARISON:  Chest radiograph performed 07/25/2012 FINDINGS: The lungs are well-aerated and clear. There is no evidence of focal opacification, pleural effusion or pneumothorax. The heart is borderline normal in size. No acute osseous abnormalities are seen. IMPRESSION: No acute cardiopulmonary process seen. Electronically Signed   By: JeGarald Balding.D.   On: 09/27/2016 21:33   Mr Abdomen Wwo Contrast  Result Date: 09/19/2016 CLINICAL DATA:  Liver lesions seen on ultrasound EXAM: MRI ABDOMEN WITHOUT AND WITH CONTRAST TECHNIQUE: Multiplanar multisequence MR imaging of the abdomen was performed both before and after the administration  of intravenous contrast. CONTRAST:  16m MULTIHANCE GADOBENATE DIMEGLUMINE 529 MG/ML IV SOLN COMPARISON:  Ultrasound exam 09/11/2016 FINDINGS: Lower chest:  Unremarkable. Hepatobiliary: 9.1 x 7.0 x 10.2 cm mass is identified in the lateral segment left liver and extending inferiorly. Lesion has an irregular large central area of nonenhancement with areas of peripheral rim enhancement and altered perfusion through much of the lateral segment left liver. Multiple small satellite lesions are associated. Pancreas: No focal mass lesion. No dilatation of the main duct. No intraparenchymal cyst. No peripancreatic edema. Spleen: No splenomegaly. No focal mass lesion. Adrenals/Urinary Tract: No  adrenal nodule or mass. Multiple bilateral renal cysts are evident. Some of these cysts have T1 shortening on precontrast imaging compatible with internal proteinaceous debris or hemorrhage. Stomach/Bowel: Stomach is nondistended. No gastric wall thickening. No evidence of outlet obstruction. Duodenum is normally positioned as is the ligament of Treitz. No small bowel or colonic dilatation within the visualized abdomen. Vascular/Lymphatic: No abdominal aortic aneurysm. 2.7 x 2.9 cm retrocaval enhancing irregular soft tissue nodule is identified medial to the right kidney (see image 67 of series 1005). Other: No intraperitoneal free fluid. Musculoskeletal: No abnormal marrow enhancement within the visualized bony anatomy. IMPRESSION: 1. 9 x 7 x 10 mm mass largely replaces the lateral segment of the left liver and extends inferiorly. Lesion has a large central area of nonenhancement likely related to degeneration or necrosis. Several satellite nodules are evident. Imaging features remain highly concerning for neoplasm. Given the extension below the liver, abscess is considered less likely. 2. 3 cm retrocaval lymph node/soft tissue nodule, concerning for metastatic disease. 3. PET-CT recommended to further evaluate. Electronically Signed   By: EMisty StanleyM.D.   On: 09/19/2016 09:24   [place]cess Drain  Result Date: 09/29/2016 INDICATION: Remote history of prostate cancer, now with indeterminate hepatic mass, potentially representative of a hepatic abscess. Please perform ultrasound-guided biopsy and/or drainage catheter placement. EXAM: ULTRASOUND GUIDED ABSCESS DRAINAGE COMPARISON:  Abdominal MRI - 09/18/2016; abdominal ultrasound - 09/11/2016 MEDICATIONS: The patient is currently admitted to the hospital and receiving intravenous antibiotics. The antibiotics were administered within an appropriate time frame prior to the initiation of the procedure. ANESTHESIA/SEDATION: Moderate (conscious) sedation was  employed during this procedure. A total of Versed 4 mg and Fentanyl 100 mcg was administered intravenously. Moderate Sedation Time: 20 minutes. The patient's level of consciousness and vital signs were monitored continuously by radiology nursing throughout the procedure under my direct supervision. CONTRAST:  None COMPLICATIONS: None immediate. PROCEDURE: Informed written consent was obtained from the patient after a discussion of the risks, benefits and alternatives to treatment. Ultrasound scanning was performed of the right upper abdominal quadrant demonstrating the serpiginous hypoechoic hepatic lesion seen on preceding abdominal MRI with dominant ill-defined component measured approximately 8.4 x 6.2 cm (image 8). Fluid was noted to be swirling within the central aspect of this ill-defined serpiginous hepatic mass raising the concern this structure indeed is representative of a hepatic abscess. The midline of the upper abdomen is prepped and draped in usual sterile fashion. The procedure was planned. A timeout was performed prior to the initiation of the procedure. The overlying soft tissues were anesthetized with 1% lidocaine with epinephrine. Appropriate trajectory was planned with the use of a 22 gauge spinal needle. An 18 gauge trocar needle was advanced into the abscess/fluid collection with aspiration yielding purulent material. As such, decision was made to place a percutaneous drainage catheter. A short Amplatz super stiff wire was coiled within the collection.  Appropriate positioning was confirmed with ultrasound guidance. The tract was serially dilated allowing placement of a 10 [place] all-purpose drainage catheter. Multiple ultrasound images were saved for procedural documentation purposes. Following percutaneous drainage catheter placement, approximately 90 cc of purulent, slightly blood tinged fluid was aspirated. The tube was connected to a JP bulb and sutured in place. A dressing was placed. The  patient tolerated the procedure well without immediate post procedural complication. IMPRESSION: Successful ultrasound guided placement of a 10 [place] all purpose drain catheter into the hepatic abscess with aspiration of 90 cc of purulent, slightly blood tinged fluid. Samples were sent to the laboratory as requested by the ordering clinical team. Above findings were discussed with Dr. Sunnie Nielsen at the time of procedure completion. PLAN: - would recommend formal ID consult. - would recommend contrast-enhanced CT of the abdomen and pelvis to evaluate for a potential neither source of infection (such as a low-grade diverticulitis) as well as to ensure there are no additional potentially drainable hepatic abscesses. Electronically Signed   By: Simonne Come M.D.   On: 09/29/2016 16:07   US Abdomen Complete  Result Date: 09/11/2016 CLINICAL DATA:  Decrease in energy and appetite for 12 days. Nausea and vomiting 1 week ago. Elevated liver function tests. EXAM: ABDOMEN ULTRASOUND COMPLETE COMPARISON:  None. FINDINGS: Gallbladder: Multiple tiny and mobile stones are seen layering dependently within the gallbladder. There is no gallbladder wall thickening or pericholecystic fluid. Sonographer reports negative Murphy's sign. Common bile duct: Diameter: 0.4 cm Liver: The liver is markedly heterogeneous. A hypoechoic mass lesion measuring 9.0 x 4.9 x 8.9 cm is identified in the left hepatic lobe. Portal triads for atypically prominent suggestive of hepatitis. IVC: No abnormality visualized. Pancreas: Visualized portion unremarkable. Spleen: No focal lesion.  The spleen appears prominent. Right Kidney: Length: 11.9 cm. Echogenicity within normal limits. No solid mass or hydronephrosis visualized. A few small and simple appearing cysts are identified. Left Kidney: Length: 10.9 cm. Echogenicity within normal limits. No solid mass or hydronephrosis visualized. A few small cysts are noted. Abdominal aorta: No aneurysm visualized.  Other findings: None. IMPRESSION: Large mass lesion in the left hepatic lobe worrisome for primary or possibly metastatic carcinoma. MRI of the abdomen with and without contrast is recommended for further evaluation. Atypically increased echogenicity of portal triads as can be seen in hepatitis. Prominent appearing spleen. Tiny gallstones without evidence of cholecystitis. These results will be called to the ordering clinician or representative by the Radiologist Assistant, and communication documented in the PACS or zVision Dashboard. Electronically Signed   By: Drusilla Kanner M.D.   On: 09/11/2016 12:42   Ct Abdomen Pelvis W Contrast  Result Date: 09/30/2016 CLINICAL DATA:  Recently diagnosed with very large liver mass. Subacute onset of generalized fatigue and high fever. Initial encounter. EXAM: CT ABDOMEN AND PELVIS WITH CONTRAST TECHNIQUE: Multidetector CT imaging of the abdomen and pelvis was performed using the standard protocol following bolus administration of intravenous contrast. CONTRAST:  ISOVUE-300 IOPAMIDOL (ISOVUE-300) INJECTION 61% COMPARISON:  MRI of the abdomen performed 09/18/2016 FINDINGS: Lower chest: Minimal atelectasis is noted at the lung bases. The visualized portions of the mediastinum are unremarkable. Hepatobiliary: A large complex centrally cystic mass is again noted within the left hepatic lobe, measuring approximately 9.3 x 7.6 x 7.7 cm. Scattered fluid and air are noted within the mass, with underlying extension of fluid into adjacent parts of the left hepatic lobe. This raises concern for infection of a hepatic metastasis, given the patient's symptoms.  The associated pigtail catheter is grossly unremarkable in appearance. Stones are noted dependently within the gallbladder. The gallbladder is otherwise grossly unremarkable. The common bile duct remains normal in caliber. Pancreas: The pancreas is within normal limits. Spleen: The spleen is unremarkable in appearance.  Adrenals/Urinary Tract: The adrenal glands are unremarkable in appearance. Scattered bilateral renal cysts are seen, with mild left renal scarring. A nonspecific 1.6 cm isodense lesion is noted at the anterior aspect of the right kidney. Nonspecific perinephric stranding is noted bilaterally. There is no evidence of hydronephrosis. A nonobstructing 8 mm stone is noted at the lower pole of the left kidney. Scattered calcification is noted about left renal cysts. No obstructing ureteral stones are seen. Stomach/Bowel: There is distention of the distal tip of the appendix to 1.0 cm in maximal diameter, though contrast is seen filling the remainder of the appendix, without definite evidence of appendicitis. There appears to be mild diffuse wall thickening along the third segment of the duodenum. Would correlate for any evidence of duodenitis. The remaining small bowel is unremarkable in appearance. The stomach is grossly unremarkable in appearance. The colon is largely filled with contrast. Scattered diverticulosis is noted along the descending and sigmoid colon. Diffuse wall thickening is noted at the mid sigmoid colon, with surrounding soft tissue inflammation, concerning for acute diverticulitis. There is apparent contained perforation noted at the mid sigmoid colon, with an abscess containing contrast, fluid and air seated superior to the bladder, measuring 5.8 x 3.9 cm. Diffuse surrounding soft tissue inflammation is noted, with diffuse wall thickening at the dome of the bladder. Trace associated free fluid is seen within the pelvis. Vascular/Lymphatic: Scattered calcification is seen along the abdominal aorta and its branches. The abdominal aorta is otherwise grossly unremarkable. The inferior vena cava is grossly unremarkable. No retroperitoneal lymphadenopathy is seen. No pelvic sidewall lymphadenopathy is identified. Reproductive: Aside from marked soft tissue inflammation at the dome of the bladder due to the  adjacent abscess, the bladder is mildly distended and otherwise grossly unremarkable. The patient is status post prostatectomy. Other: No additional soft tissue abnormalities are seen. Musculoskeletal: No acute osseous abnormalities are identified. The visualized musculature is unremarkable in appearance. IMPRESSION: 1. Large complex centrally cystic mass again noted within the left hepatic lobe, measuring approximately 9.3 x 7.6 x 7.7 cm. Scattered air and fluid within the mass tracks into adjacent portions of the left hepatic lobe, raising concern for infection of an underlying hepatic metastasis, given the patient's symptoms. Associated pigtail catheter is unremarkable in appearance. 2. Acute diverticulitis noted at the mid sigmoid colon, with diffuse wall thickening and surrounding soft tissue inflammation. Trace free fluid within the pelvis. Associated contained bowel perforation noted, with an abscess containing contrast, fluid and air just superior to the bladder, measuring 5.8 x 3.9 cm. Diffuse surrounding soft tissue inflammation, with diffuse wall thickening at the dome of the bladder. 3. Mild diffuse wall thickening along the third segment of the duodenum. Would correlate for any evidence of duodenitis. 4. Scattered diverticulosis along the descending and sigmoid colon. 5. Scattered aortic atherosclerosis. 6. Cholelithiasis.  Gallbladder otherwise grossly unremarkable. 7. Scattered bilateral renal cysts, with mild left renal scarring. Nonspecific 1.6 cm isodense lesion noted at the anterior aspect of the right kidney. This likely reflects a proteinaceous cyst, given appearance on recent prior MRI. 8. Distention of the distal tip of the appendix to 1.0 cm maximal diameter, without definite evidence of appendicitis. Contrast noted filling the remainder of the appendix. These results were  called by telephone at the time of interpretation on 09/30/2016 at 11:08 pm to [facility] RN on [id number], who verbally  acknowledged these results. Electronically Signed   By: Roanna Raider M.D.   On: 09/30/2016 23:14    Microbiology: Recent Results (from the past 240 hour(s))  Blood Culture (routine x 2)     Status: None   Collection Time: 09/27/16  9:05 PM  Result Value Ref Range Status   Specimen Description BLOOD BLOOD RIGHT FOREARM  Final   Special Requests   Final    BOTTLES DRAWN AEROBIC AND ANAEROBIC Blood Culture adequate volume   Culture   Final    NO GROWTH 5 DAYS Performed at [facility], [address] [place], [place] [address]    Report Status 10/03/2016 FINAL  Final  Blood Culture (routine x 2)     Status: None   Collection Time: 09/27/16  9:35 PM  Result Value Ref Range Status   Specimen Description BLOOD RIGHT ANTECUBITAL  Final   Special Requests   Final    BOTTLES DRAWN AEROBIC AND ANAEROBIC Blood Culture adequate volume   Culture   Final    NO GROWTH 5 DAYS Performed at [facility], [address] [place], [place] [address]    Report Status 10/03/2016 FINAL  Final  Culture, Urine     Status: Abnormal   Collection Time: 09/27/16 10:09 PM  Result Value Ref Range Status   Specimen Description URINE, CLEAN CATCH  Final   Special Requests NONE  Final   Culture MULTIPLE SPECIES PRESENT, SUGGEST RECOLLECTION (A)  Final   Report Status 09/29/2016 FINAL  Final  Aerobic/Anaerobic Culture (surgical/deep wound)     Status: None (Preliminary result)   Collection Time: 09/29/16  1:10 PM  Result Value Ref Range Status   Specimen Description ABSCESS HEPATIC DRAIN  Final   Special Requests Normal  Final   Gram Stain   Final    ABUNDANT WBC PRESENT,BOTH PMN AND MONONUCLEAR ABUNDANT GRAM POSITIVE COCCI IN CHAINS FEW GRAM VARIABLE ROD Performed at [facility], [address], [place], [place] [address]    Culture   Final    MODERATE ESCHERICHIA COLI ABUNDANT VIRIDANS STREPTOCOCCUS NO ANAEROBES ISOLATED; CULTURE IN PROGRESS FOR 5 DAYS    Report Status PENDING   Incomplete   Organism ID, Bacteria ESCHERICHIA COLI  Final   Organism ID, Bacteria VIRIDANS STREPTOCOCCUS  Final      Susceptibility   Escherichia coli - MIC*    AMPICILLIN >=32 RESISTANT Resistant     CEFAZOLIN 8 SENSITIVE Sensitive     CEFEPIME <=1 SENSITIVE Sensitive     CEFTAZIDIME <=1 SENSITIVE Sensitive     CEFTRIAXONE <=1 SENSITIVE Sensitive     CIPROFLOXACIN <=0.25 SENSITIVE Sensitive     GENTAMICIN <=1 SENSITIVE Sensitive     IMIPENEM <=0.25 SENSITIVE Sensitive     TRIMETH/SULFA <=20 SENSITIVE Sensitive     AMPICILLIN/SULBACTAM >=32 RESISTANT Resistant     PIP/TAZO <=4 SENSITIVE Sensitive     Extended ESBL NEGATIVE Sensitive     * MODERATE ESCHERICHIA COLI   Viridans streptococcus - MIC*    PENICILLIN <=0.06 SENSITIVE Sensitive     CEFTRIAXONE 0.25 SENSITIVE Sensitive     ERYTHROMYCIN <=0.12 SENSITIVE Sensitive     LEVOFLOXACIN 0.5 SENSITIVE Sensitive     VANCOMYCIN 0.5 SENSITIVE Sensitive     * ABUNDANT VIRIDANS STREPTOCOCCUS     Labs: Basic Metabolic Panel:  Recent Labs Lab 09/29/16  7829 09/30/16 0402 10/01/16 1010 10/02/16 0531 10/03/16 1130  NA 133* 134* 135 137 137  K 3.8 4.6 3.5 3.3* 3.9  CL 101 103 103 106 104  CO2 '23 22 25 24 26  '$ GLUCOSE 129* 114* 207* 106* 91  BUN '13 15 9 6 8  '$ CREATININE 0.99 1.03 0.86 0.76 0.80  CALCIUM 8.1* 8.4* 8.1* 7.6* 8.3*   Liver Function Tests:  Recent Labs Lab 09/27/16 2105 09/29/16 0418 09/30/16 0402 10/01/16 1010 10/02/16 0531  AST 47* 44* 42* 21 16  ALT 43 47 42 29 21  ALKPHOS 158* 136* 166* 135* 110  BILITOT 0.6 0.8 1.4* 0.3 0.4  PROT 6.8 5.9* 6.6 5.9* 5.2*  ALBUMIN 2.5* 2.2* 2.4* 2.0* 1.8*   No results for input(s): LIPASE, AMYLASE in the last 168 hours. No results for input(s): AMMONIA in the last 168 hours. CBC:  Recent Labs Lab 09/27/16 2105  09/29/16 0418 09/30/16 0402 10/01/16 1010 10/02/16 0531 10/03/16 1130  WBC 15.5*  < > 14.2* 12.2* 8.8 6.7 10.5  NEUTROABS 12.5*  --  11.5*  --   --    --   --   HGB 7.7*  < > 7.7* 10.5* 8.6* 7.7* 9.0*  HCT 23.6*  < > 23.2* 30.6* 25.8* 23.0* 27.8*  MCV 81.4  < > 83.2 81.6 82.7 83.0 83.5  PLT 373  < > 310 302 346 288 447*  < > = values in this interval not displayed.  CBG:  Recent Labs Lab 10/03/16 1000 10/03/16 1207 10/03/16 1655 10/03/16 2307 10/04/16 0802  GLUCAP 116* 148* 108* 144* 116*       Signed:  Oswald Hillock MD.  [facility] 10/04/2016, 10:43 AM

## 2016-10-04 NOTE — Care Management Important Message (Signed)
Important Message  Patient Details  Name: Billy Horn MRN: [id number] Date of Birth: 1945-05-28   Medicare Important Message Given:  Yes    Erenest Rasher, RN 10/04/2016, 11:11 AM

## 2016-10-04 NOTE — Care Management Note (Addendum)
Case Management Note  Patient Details  Name: Billy Horn MRN: [id number] Date of Birth: Jun 18, 1945  Subjective/Objective:  Liver abscess/mass, acute sigmoid diverticulitis with contained perforation                   Action/Plan: Discharge Planning: NCM spoke to pt and scheduled dc home today with IV abx. Contacted [facility] to see if [facility] RN scheduled to come today to administer dose or will he need to receive prior to dc. Faxed IV abx RX to [facility]. Unit RN will give Ivanz dose prior to dc. Wife at home to assist with care. Wife is a retired Therapist, sports.   PCP Donavan Burnet MD  Expected Discharge Date:  10/04/2016               Expected Discharge Plan:  [place]  In-House Referral:  NA  Discharge planning Services  CM Consult  Post Acute Care Choice:  Home Health Choice offered to:  Patient  DME Arranged:  N/A DME Agency:  NA  HH Arranged:  RN [place] Agency:  [place]  Status of Service:  Completed, signed off  If discussed at [place] of Stay Meetings, dates discussed:    Additional Comments:  Erenest Rasher, RN 10/04/2016, 9:50 AM

## 2016-10-05 DIAGNOSIS — S82109A Unspecified fracture of upper end of unspecified tibia, initial encounter for closed fracture: Secondary | ICD-10-CM | POA: Diagnosis not present

## 2016-10-05 DIAGNOSIS — K5780 Diverticulitis of intestine, part unspecified, with perforation and abscess without bleeding: Secondary | ICD-10-CM | POA: Diagnosis not present

## 2016-10-05 DIAGNOSIS — B9620 Unspecified Escherichia coli [E. coli] as the cause of diseases classified elsewhere: Secondary | ICD-10-CM | POA: Diagnosis not present

## 2016-10-05 DIAGNOSIS — E119 Type 2 diabetes mellitus without complications: Secondary | ICD-10-CM | POA: Diagnosis not present

## 2016-10-05 DIAGNOSIS — K750 Abscess of liver: Secondary | ICD-10-CM | POA: Diagnosis not present

## 2016-10-05 LAB — GLUCOSE, CAPILLARY: GLUCOSE-CAPILLARY: 98 mg/dL (ref 65–99)

## 2016-10-08 DIAGNOSIS — K750 Abscess of liver: Secondary | ICD-10-CM | POA: Diagnosis not present

## 2016-10-08 DIAGNOSIS — K5780 Diverticulitis of intestine, part unspecified, with perforation and abscess without bleeding: Secondary | ICD-10-CM | POA: Diagnosis not present

## 2016-10-08 DIAGNOSIS — B9620 Unspecified Escherichia coli [E. coli] as the cause of diseases classified elsewhere: Secondary | ICD-10-CM | POA: Diagnosis not present

## 2016-10-12 DIAGNOSIS — K750 Abscess of liver: Secondary | ICD-10-CM | POA: Diagnosis not present

## 2016-10-12 DIAGNOSIS — K5792 Diverticulitis of intestine, part unspecified, without perforation or abscess without bleeding: Secondary | ICD-10-CM | POA: Diagnosis not present

## 2016-10-12 DIAGNOSIS — D649 Anemia, unspecified: Secondary | ICD-10-CM | POA: Diagnosis not present

## 2016-10-13 DIAGNOSIS — K750 Abscess of liver: Secondary | ICD-10-CM | POA: Diagnosis not present

## 2016-10-13 DIAGNOSIS — K5780 Diverticulitis of intestine, part unspecified, with perforation and abscess without bleeding: Secondary | ICD-10-CM | POA: Diagnosis not present

## 2016-10-13 DIAGNOSIS — B9620 Unspecified Escherichia coli [E. coli] as the cause of diseases classified elsewhere: Secondary | ICD-10-CM | POA: Diagnosis not present

## 2016-10-13 DIAGNOSIS — Z5181 Encounter for therapeutic drug level monitoring: Secondary | ICD-10-CM | POA: Diagnosis not present

## 2016-10-15 DIAGNOSIS — K5780 Diverticulitis of intestine, part unspecified, with perforation and abscess without bleeding: Secondary | ICD-10-CM | POA: Diagnosis not present

## 2016-10-15 DIAGNOSIS — B9620 Unspecified Escherichia coli [E. coli] as the cause of diseases classified elsewhere: Secondary | ICD-10-CM | POA: Diagnosis not present

## 2016-10-15 DIAGNOSIS — S82109A Unspecified fracture of upper end of unspecified tibia, initial encounter for closed fracture: Secondary | ICD-10-CM | POA: Diagnosis not present

## 2016-10-15 DIAGNOSIS — K750 Abscess of liver: Secondary | ICD-10-CM | POA: Diagnosis not present

## 2016-10-19 ENCOUNTER — Encounter: Payer: Self-pay | Admitting: Internal Medicine

## 2016-10-19 DIAGNOSIS — Z5181 Encounter for therapeutic drug level monitoring: Secondary | ICD-10-CM | POA: Diagnosis not present

## 2016-10-19 DIAGNOSIS — K750 Abscess of liver: Secondary | ICD-10-CM | POA: Diagnosis not present

## 2016-10-19 DIAGNOSIS — K5780 Diverticulitis of intestine, part unspecified, with perforation and abscess without bleeding: Secondary | ICD-10-CM | POA: Diagnosis not present

## 2016-10-19 DIAGNOSIS — B9620 Unspecified Escherichia coli [E. coli] as the cause of diseases classified elsewhere: Secondary | ICD-10-CM | POA: Diagnosis not present

## 2016-10-22 DIAGNOSIS — K750 Abscess of liver: Secondary | ICD-10-CM | POA: Diagnosis not present

## 2016-10-22 DIAGNOSIS — B9620 Unspecified Escherichia coli [E. coli] as the cause of diseases classified elsewhere: Secondary | ICD-10-CM | POA: Diagnosis not present

## 2016-10-22 DIAGNOSIS — K5780 Diverticulitis of intestine, part unspecified, with perforation and abscess without bleeding: Secondary | ICD-10-CM | POA: Diagnosis not present

## 2016-10-26 DIAGNOSIS — Z5181 Encounter for therapeutic drug level monitoring: Secondary | ICD-10-CM | POA: Diagnosis not present

## 2016-10-26 DIAGNOSIS — K750 Abscess of liver: Secondary | ICD-10-CM | POA: Diagnosis not present

## 2016-10-26 DIAGNOSIS — B9620 Unspecified Escherichia coli [E. coli] as the cause of diseases classified elsewhere: Secondary | ICD-10-CM | POA: Diagnosis not present

## 2016-10-26 DIAGNOSIS — K5780 Diverticulitis of intestine, part unspecified, with perforation and abscess without bleeding: Secondary | ICD-10-CM | POA: Diagnosis not present

## 2016-10-27 ENCOUNTER — Telehealth: Payer: Self-pay | Admitting: *Deleted

## 2016-10-27 ENCOUNTER — Encounter: Payer: Self-pay | Admitting: Internal Medicine

## 2016-10-27 ENCOUNTER — Other Ambulatory Visit: Payer: Self-pay | Admitting: Internal Medicine

## 2016-10-27 ENCOUNTER — Ambulatory Visit (INDEPENDENT_AMBULATORY_CARE_PROVIDER_SITE_OTHER): Payer: Medicare HMO | Admitting: Internal Medicine

## 2016-10-27 VITALS — BP 155/82 | HR 65 | Temp 97.8°F | Ht 72.0 in | Wt 168.0 lb

## 2016-10-27 DIAGNOSIS — K5732 Diverticulitis of large intestine without perforation or abscess without bleeding: Secondary | ICD-10-CM

## 2016-10-27 DIAGNOSIS — K750 Abscess of liver: Secondary | ICD-10-CM

## 2016-10-27 DIAGNOSIS — K5720 Diverticulitis of large intestine with perforation and abscess without bleeding: Secondary | ICD-10-CM | POA: Diagnosis not present

## 2016-10-27 HISTORY — DX: Diverticulitis of large intestine with perforation and abscess without bleeding: K57.20

## 2016-10-27 MED ORDER — ERTAPENEM IV (FOR PTA / DISCHARGE USE ONLY): 1.0000 g | INTRAVENOUS | 0 refills | Status: AC

## 2016-10-27 NOTE — Assessment & Plan Note (Signed)
I will obtain a repeat CT scan of the abdomen and pelvis with contrast to evaluate the liver abscess and sigmoid diverticular abscess. Hopefully the abscess has been drained adequately that the drain can now be removed.

## 2016-10-27 NOTE — Telephone Encounter (Signed)
Verbal order given to Debbie at [facility] to stop IV Ertapenem after today's dose per Dr. Megan Salon. Picc line is to be maintained until after patient's CT scan and liver drain removal. I will call [facility] back once I know when the picc line should be removed. Myrtis Hopping CMA

## 2016-10-27 NOTE — Progress Notes (Signed)
[place] for Infectious Disease  Patient Active Problem List   Diagnosis Date Noted  . Colonic diverticular abscess 10/27/2016    Priority: High  . Sigmoid diverticulitis 10/01/2016    Priority: High  . Liver abscess 09/28/2016    Priority: High  . Anemia 09/28/2016  . Diabetes mellitus type 2, uncontrolled ([place]) 07/27/2012  . Narcotic induced constipation 07/27/2012  . Tibial plateau fracture 07/27/2012    Patient's Medications  New Prescriptions   No medications on file  Previous Medications   ACETAMINOPHEN (TYLENOL) 325 MG TABLET    Take 1-2 tablets (325-650 mg total) by mouth every 4 (four) hours as needed.   ASPIRIN EC 81 MG TABLET    Take 81 mg by mouth daily after breakfast.   CALCIUM CARBONATE (CALCIUM-CARB 600 PO)    Take 1 tablet by mouth daily after breakfast.   DOCUSATE SODIUM (COLACE) 100 MG CAPSULE    Take 1 capsule (100 mg total) by mouth 2 (two) times daily.   FERROUS SULFATE 325 (65 FE) MG TABLET    Take 1 tablet (325 mg total) by mouth 2 (two) times daily with a meal. For anemia   MULTIPLE VITAMIN (MULTIVITAMIN WITH MINERALS) TABS    Take 1 tablet by mouth daily after breakfast.    PSYLLIUM (METAMUCIL) 0.52 G CAPSULE    Take 1.04 g by mouth daily after breakfast.   Modified Medications   Modified Medication Previous Medication   ERTAPENEM (INVANZ) IVPB ertapenem (INVANZ) IVPB      Inject 1 g into the vein daily. Indication:  Liver abscess Last Day of Therapy:  10/27/16 Labs - Once weekly:  CBC/D and BMP, Labs - Every other week:  ESR and CRP    Inject 1 g into the vein daily. Indication:  Liver abscess Last Day of Therapy:  10/27/16 Labs - Once weekly:  CBC/D and BMP, Labs - Every other week:  ESR and CRP  Discontinued Medications   LOSARTAN (COZAAR) 50 MG TABLET    Take 1 tablet (50 mg total) by mouth daily.   METFORMIN (GLUCOPHAGE) 500 MG TABLET    Take 1 tablet (500 mg total) by mouth daily with breakfast. For diabetes     Subjective: Mr. Strickling is in with his wife, BJ, for his hospital follow-up visit. He was hospitalized one month ago with a several week history of malaise, anorexia and weight loss. His primary care physician had done MRI of his abdomen which revealed a liver abscess. A CT scan was done in the hospital which also showed sigmoid diverticulitis with a small abscess. He had a drain placed in the liver abscess and cultures grew Escherichia coli and viridans strep. He was discharged with the drain on IV ertapenem. He is now completed 31 days of total therapy. He is feeling much better. He has not had any fever, chills or sweats. His appetite is improving and he believes he has regained some of the weight he lost. He has not had any problems tolerating his PICC or ertapenem. He has had minimal drain output for the past week.  Review of Systems: Review of Systems  Constitutional: Negative for chills, diaphoresis, fever and weight loss.  Gastrointestinal: Negative for abdominal pain, diarrhea, nausea and vomiting.    Past Medical History:  Diagnosis Date  . Cancer [facility]) 2012   prostate cancer  . Diabetes mellitus    controlled with diet and exercise    Social History  Substance Use Topics  . Smoking status: Never Smoker  . Smokeless tobacco: Never Used  . Alcohol use Yes     Comment: occas wine    Family History  Problem Relation Age of Onset  . Lung cancer Mother 46    No Known Allergies  Objective: Vitals:   10/27/16 0903  BP: (!) 155/82  Pulse: 65  Temp: 97.8 F (36.6 C)  TempSrc: Oral  Weight: 168 lb (76.2 kg)  Height: 6' (1.829 m)   Body mass index is 22.78 kg/m.  Physical Exam  Constitutional: He is oriented to person, place, and time.  He is in good spirits.  Cardiovascular: Normal rate and regular rhythm.   No murmur heard. Pulmonary/Chest: Effort normal and breath sounds normal.  Abdominal: Soft. He exhibits no distension. There is no tenderness.  There are  just a few drops of cloudy fluid in his drain bulb.  Neurological: He is alert and oriented to person, place, and time.  Psychiatric: Mood and affect normal.    Lab Results    Problem List Items Addressed This Visit      High   Colonic diverticular abscess   Liver abscess    I will obtain a repeat CT scan of the abdomen and pelvis with contrast to evaluate the liver abscess and sigmoid diverticular abscess. Hopefully the abscess has been drained adequately that the drain can now be removed.      Relevant Orders   CT ABDOMEN PELVIS W CONTRAST   Sigmoid diverticulitis - Primary       Michel Bickers, MD [place] for Infectious [facility] [phone] pager   [phone] cell 10/27/2016, 10:47 AM

## 2016-10-28 ENCOUNTER — Other Ambulatory Visit: Payer: Self-pay | Admitting: Internal Medicine

## 2016-10-28 ENCOUNTER — Other Ambulatory Visit: Payer: Self-pay | Admitting: Gastroenterology

## 2016-10-28 DIAGNOSIS — K5720 Diverticulitis of large intestine with perforation and abscess without bleeding: Secondary | ICD-10-CM | POA: Diagnosis not present

## 2016-10-28 DIAGNOSIS — K750 Abscess of liver: Secondary | ICD-10-CM | POA: Diagnosis not present

## 2016-10-28 NOTE — Telephone Encounter (Signed)
Patient notified of appt at [facility] for 11/04/16 at 10:45 AM. He will meet with the radiologist and have his CT scan. He is also scheduled to have his liver drain removed, (if everything is ok with CT). Advised patient nothing to eat for four hours prior to exam. CT prior authorized through [employer], approval # [id number], good through 01/26/17. Myrtis Hopping CMA

## 2016-10-29 DIAGNOSIS — K5780 Diverticulitis of intestine, part unspecified, with perforation and abscess without bleeding: Secondary | ICD-10-CM | POA: Diagnosis not present

## 2016-10-29 DIAGNOSIS — B9620 Unspecified Escherichia coli [E. coli] as the cause of diseases classified elsewhere: Secondary | ICD-10-CM | POA: Diagnosis not present

## 2016-10-29 DIAGNOSIS — K750 Abscess of liver: Secondary | ICD-10-CM | POA: Diagnosis not present

## 2016-10-29 NOTE — Telephone Encounter (Signed)
Spoke to [place] at [facility] and advised that I already gave orders to pharmacy per Dr. Linus Salmons to maintain picc line until pending CT scan has been reviewed. Myrtis Hopping

## 2016-10-29 NOTE — Telephone Encounter (Signed)
Beth, RN at [facility], calling for orders to continue PICC care, needs updated dates.  Call back number is [phone]. Landis Gandy, RN

## 2016-11-02 DIAGNOSIS — K750 Abscess of liver: Secondary | ICD-10-CM | POA: Diagnosis not present

## 2016-11-02 DIAGNOSIS — Z5181 Encounter for therapeutic drug level monitoring: Secondary | ICD-10-CM | POA: Diagnosis not present

## 2016-11-02 DIAGNOSIS — K5780 Diverticulitis of intestine, part unspecified, with perforation and abscess without bleeding: Secondary | ICD-10-CM | POA: Diagnosis not present

## 2016-11-02 DIAGNOSIS — B9620 Unspecified Escherichia coli [E. coli] as the cause of diseases classified elsewhere: Secondary | ICD-10-CM | POA: Diagnosis not present

## 2016-11-04 ENCOUNTER — Other Ambulatory Visit: Payer: Self-pay | Admitting: Internal Medicine

## 2016-11-04 ENCOUNTER — Ambulatory Visit
Admission: RE | Admit: 2016-11-04 | Discharge: 2016-11-04 | Disposition: A | Discharge status: Home / Self Care | Payer: Medicare HMO | Source: Ambulatory Visit | Attending: Internal Medicine | Admitting: Internal Medicine

## 2016-11-04 DIAGNOSIS — K750 Abscess of liver: Secondary | ICD-10-CM

## 2016-11-04 HISTORY — PX: IR RADIOLOGIST EVAL & MGMT: IMG5224

## 2016-11-04 MED ORDER — IOPAMIDOL (ISOVUE-300) INJECTION 61%
100.0000 mL | Freq: Once | INTRAVENOUS | Status: AC | PRN
  Administered 2016-11-04: 100 mL via INTRAVENOUS

## 2016-11-04 NOTE — Progress Notes (Signed)
Chief Complaint: Patient was seen in consultation today for No chief complaint on file.  at the request of [facility]  Referring Physician(s): Campbell,John  History of Present Illness: Billy Horn is a 71 y.o. male with a history of diverticulitis and a hepatic abscess. The output has been minimal over the last few days. He denies fevers. Output is clear. A CT was performed demonstrating resolution of the liver abscess. He denies any abdominal pain but does have dysuria occasionally.  Past Medical History:  Diagnosis Date  . Cancer [facility]) 2012   prostate cancer  . Diabetes mellitus    controlled with diet and exercise    Past Surgical History:  Procedure Laterality Date  . ORIF TIBIA PLATEAU Left 07/23/2012   Procedure: OPEN REDUCTION INTERNAL FIXATION (ORIF) TIBIAL PLATEAU;  Surgeon: Sharmon Revere, MD;  Location: [facility];  Service: Orthopedics;  Laterality: Left;  . ROBOT ASSISTED LAPAROSCOPIC RADICAL PROSTATECTOMY  04/06/2011   Procedure: ROBOTIC ASSISTED LAPAROSCOPIC RADICAL PROSTATECTOMY LEVEL 2;  Surgeon: Dutch Gray, MD;  Location: [facility];  Service: Urology;  Laterality: N/A;  Bilateral Pelvic Lymphadenectomy   . TONSILLECTOMY     as a child    Allergies: Patient has no known allergies.  Medications: Prior to Admission medications   Medication Sig Start Date End Date Taking? Authorizing Provider  acetaminophen (TYLENOL) 325 MG tablet Take 1-2 tablets (325-650 mg total) by mouth every 4 (four) hours as needed. Patient taking differently: Take 325-650 mg by mouth every 4 (four) hours as needed for mild pain, moderate pain or headache.  08/02/12   Love, Ivan Anchors, PA-C  aspirin EC 81 MG tablet Take 81 mg by mouth daily after breakfast.    [provider]  Calcium Carbonate (CALCIUM-CARB 600 PO) Take 1 tablet by mouth daily after breakfast.    [provider]  docusate sodium (COLACE) 100 MG capsule Take 1 capsule (100 mg total) by mouth 2 (two)  times daily. 10/04/16   Oswald Hillock, MD  ferrous sulfate 325 (65 FE) MG tablet Take 1 tablet (325 mg total) by mouth 2 (two) times daily with a meal. For anemia 08/02/12   Love, Ivan Anchors, PA-C  Multiple Vitamin (MULTIVITAMIN WITH MINERALS) TABS Take 1 tablet by mouth daily after breakfast.     [provider]  psyllium (METAMUCIL) 0.52 G capsule Take 1.04 g by mouth daily after breakfast.     [provider]     Family History  Problem Relation Age of Onset  . Lung cancer Mother 40    Social History   Social History  . Marital status: Single    Spouse name: N/A  . Number of children: N/A  . Years of education: N/A   Occupational History  . 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 Smoking status: Never Smoker  . Smokeless tobacco: Never Used  . Alcohol use Yes     Comment: occas wine  . Drug use: No  . Sexual activity: Not on file   Other Topics Concern  . Not on file   Social History Narrative  . No narrative on file     Review of Systems: A 12 point ROS discussed and pertinent positives are indicated in the HPI above.  All other systems are negative.  Review of Systems  Vital Signs: There were no vitals taken for this visit.  Physical Exam  Constitutional: He is oriented to person, place, and  time. He appears well-developed and well-nourished.  Abdominal: Soft.  The liver abscess drain insertion site is clean and dry without signs of local infection  Neurological: He is alert and oriented to person, place, and time.  Skin: Skin is warm and dry.     Imaging: Ct Abdomen Pelvis W Contrast  Result Date: 11/04/2016 CLINICAL DATA:  Follow-up liver abscess EXAM: CT ABDOMEN AND PELVIS WITH CONTRAST TECHNIQUE: Multidetector CT imaging of the abdomen and pelvis was performed using the standard protocol following bolus administration of intravenous contrast. CONTRAST:  125mL ISOVUE-300 IOPAMIDOL (ISOVUE-300) INJECTION 61%  COMPARISON:  09/30/2016 FINDINGS: Lower chest: Minimal dependent atelectasis Hepatobiliary: A drain is present in the left lobe of the liver. The previously seen liver abscess has completely resolved. No new abscess. Cholelithiasis. Pancreas: Unremarkable Spleen: Unremarkable Adrenals/Urinary Tract: Stable hypodense and hyperdense renal lesions which are previously characterized as cysts. Stable left nephrolithiasis. No hydronephrosis. Adrenal glands are unremarkable. Bladder will be described below. Stomach/Bowel: Stomach is unremarkable. Appendix is within normal limits. Moderate stool burden throughout the colon. Diverticulosis of the colon is present. There is persistent wall thickening of the sigmoid colon which has improved. The thick walled fluid, gas, and stool collection extending from the sigmoid colon towards the dome of the bladder is stable. This either represents a chronic abscess or large diverticulum. The dome of the bladder is abnormal with a multi lamellated appearance. This probably represents severe of edema within the wall of the dome of the bladder are can't or fluid within the dome or the wall of the bladder. Vascular/Lymphatic: No evidence of aortic aneurysm. Atherosclerotic calcifications of the aorta and iliac arterial system. No abnormal retroperitoneal adenopathy. Borderline left inguinal adenopathy. 11 mm short axis diameter left inguinal lymph node is not significantly changed. Reproductive: Prostate is absent. Other: No anterior abdominal wall hernia. Musculoskeletal: No vertebral compression deformity. L4-5 shallow disc protrusion. IMPRESSION: After placement of an abscess strain, the liver abscess has resolved. Persistent thick walled fluid, gas, and stool collection extending from the sigmoid colon to the dome of the bladder. This either represents a chronic perforation which has been walled-off for a large diverticulum. Improving wall thickening and inflammatory change of the  sigmoid colon. Stable abnormal appearance of the dome of the bladder as described representing either edema in the wall or fluid in the wall of the dome. Electronically Signed   By: Marybelle Killings M.D.   On: 11/04/2016 12:06    Labs:  CBC:  Recent Labs  09/30/16 0402 10/01/16 1010 10/02/16 0531 10/03/16 1130  WBC 12.2* 8.8 6.7 10.5  HGB 10.5* 8.6* 7.7* 9.0*  HCT 30.6* 25.8* 23.0* 27.8*  PLT 302 346 288 447*    COAGS:  Recent Labs  09/28/16 0155 09/29/16 0418  INR 1.38 1.35  APTT 40*  --     BMP:  Recent Labs  09/30/16 0402 10/01/16 1010 10/02/16 0531 10/03/16 1130  NA 134* 135 137 137  K 4.6 3.5 3.3* 3.9  CL 103 103 106 104  CO2 22 25 24 26   GLUCOSE 114* 207* 106* 91  BUN 15 9 6 8   CALCIUM 8.4* 8.1* 7.6* 8.3*  CREATININE 1.03 0.86 0.76 0.80  GFRNONAA >60 >60 >60 >60  GFRAA >60 >60 >60 >60    LIVER FUNCTION TESTS:  Recent Labs  09/29/16 0418 09/30/16 0402 10/01/16 1010 10/02/16 0531  BILITOT 0.8 1.4* 0.3 0.4  AST 44* 42* 21 16  ALT 47 42 29 21  ALKPHOS 136* 166* 135*  110  PROT 5.9* 6.6 5.9* 5.2*  ALBUMIN 2.2* 2.4* 2.0* 1.8*    TUMOR MARKERS:  Recent Labs  09/28/16 0828 09/30/16 1004  AFPTM 1.0  --   CEA  --  1.7    Assessment and Plan:  CT scan demonstrates resolution of the liver abscess. Abscess drain injection under fluoroscopic guidance demonstrates no evidence of fistula. The liver abscess has resolved. The abnormal findings in the pelvis related to either a walled off abscess or large diverticulum are stable. Inflammation of the sigmoid colon is improving. The drain was removed. He is to follow with surgery for possible sigmoid resection.    Electronically Signed: Niyah Mamaril, ART A 11/04/2016, 2:22 PM   I spent a total of   15 Minutes in face to face in clinical consultation, greater than 50% of which was counseling/coordinating care for hepatic abscess.  Patient ID: Billy Horn, male   DOB: [birth year], 71 y.o.   MRN: [id number]

## 2016-11-05 ENCOUNTER — Telehealth: Payer: Self-pay | Admitting: Internal Medicine

## 2016-11-05 ENCOUNTER — Encounter (HOSPITAL_COMMUNITY): Payer: Self-pay | Admitting: *Deleted

## 2016-11-05 DIAGNOSIS — B9620 Unspecified Escherichia coli [E. coli] as the cause of diseases classified elsewhere: Secondary | ICD-10-CM | POA: Diagnosis not present

## 2016-11-05 DIAGNOSIS — K5780 Diverticulitis of intestine, part unspecified, with perforation and abscess without bleeding: Secondary | ICD-10-CM | POA: Diagnosis not present

## 2016-11-05 DIAGNOSIS — K750 Abscess of liver: Secondary | ICD-10-CM | POA: Diagnosis not present

## 2016-11-05 NOTE — Telephone Encounter (Signed)
Per verbal order from Dr Megan Salon, ok to pull PICC.  RN relayed order to Coretta at [facility], repeated and verified. Landis Gandy, RN

## 2016-11-05 NOTE — Progress Notes (Signed)
Pt denies cardiac history, chest pain or sob. Pt states he is a type 2 diabetic but controls it with diet and exercise. His most recent A1C was 7.4 on 09/28/16. Pt states his fasting blood sugar is usually around 100-115. Instructed pt to check his blood sugar Monday AM when he gets up and every 2 hours until he leaves for the hospital. If blood sugar is 70 or below, treat with 1/2 cup of clear juice (apple or cranberry) and recheck blood sugar 15 minutes after drinking juice. If blood sugar continues to be 70 or below, call the Endoscopy Unit and ask to speak to a nurse. Pt voiced understanding.

## 2016-11-05 NOTE — Telephone Encounter (Signed)
CT abdomen and pelvis 11/04/2016  IMPRESSION: After placement of an abscess strain, the liver abscess has resolved.  Persistent thick walled fluid, gas, and stool collection extending from the sigmoid colon to the dome of the bladder. This either represents a chronic perforation which has been walled-off for a large diverticulum.  Improving wall thickening and inflammatory change of the sigmoid colon.  Stable abnormal appearance of the dome of the bladder as described representing either edema in the wall or fluid in the wall of the dome.   Electronically Signed   By: Marybelle Killings M.D.   On: 11/04/2016 12:06  Billy Horn abscess has resolved. His drain was removed yesterday. I will have his PICC removed. He is scheduled for colonoscopy with Dr. Leonie Douglas in the next few weeks.

## 2016-11-09 ENCOUNTER — Ambulatory Visit (HOSPITAL_COMMUNITY): Payer: Medicare HMO | Admitting: Anesthesiology

## 2016-11-09 ENCOUNTER — Encounter (HOSPITAL_COMMUNITY)
Admission: RE | Disposition: A | Discharge status: Home / Self Care | Payer: Self-pay | Source: Ambulatory Visit | Attending: Gastroenterology

## 2016-11-09 ENCOUNTER — Encounter (HOSPITAL_COMMUNITY): Payer: Self-pay | Admitting: Gastroenterology

## 2016-11-09 ENCOUNTER — Ambulatory Visit (HOSPITAL_COMMUNITY)
Admission: RE | Admit: 2016-11-09 | Discharge: 2016-11-09 | Disposition: A | Discharge status: Home / Self Care | Payer: Medicare HMO | Source: Ambulatory Visit | Attending: Gastroenterology | Admitting: Gastroenterology

## 2016-11-09 DIAGNOSIS — E119 Type 2 diabetes mellitus without complications: Secondary | ICD-10-CM | POA: Diagnosis not present

## 2016-11-09 DIAGNOSIS — Z7982 Long term (current) use of aspirin: Secondary | ICD-10-CM | POA: Insufficient documentation

## 2016-11-09 DIAGNOSIS — Z79899 Other long term (current) drug therapy: Secondary | ICD-10-CM | POA: Insufficient documentation

## 2016-11-09 DIAGNOSIS — K5730 Diverticulosis of large intestine without perforation or abscess without bleeding: Secondary | ICD-10-CM | POA: Insufficient documentation

## 2016-11-09 DIAGNOSIS — Z8546 Personal history of malignant neoplasm of prostate: Secondary | ICD-10-CM | POA: Insufficient documentation

## 2016-11-09 DIAGNOSIS — D649 Anemia, unspecified: Secondary | ICD-10-CM | POA: Diagnosis not present

## 2016-11-09 DIAGNOSIS — R933 Abnormal findings on diagnostic imaging of other parts of digestive tract: Secondary | ICD-10-CM | POA: Diagnosis not present

## 2016-11-09 HISTORY — DX: Pneumonia, unspecified organism: J18.9

## 2016-11-09 HISTORY — PX: COLONOSCOPY WITH PROPOFOL: SHX5780

## 2016-11-09 HISTORY — DX: Abscess of liver: K75.0

## 2016-11-09 HISTORY — DX: Anemia, unspecified: D64.9

## 2016-11-09 SURGERY — COLONOSCOPY WITH PROPOFOL
Anesthesia: Monitor Anesthesia Care

## 2016-11-09 SURGERY — COLONOSCOPY
Anesthesia: Monitor Anesthesia Care

## 2016-11-09 MED ORDER — LIDOCAINE HCL (CARDIAC) 20 MG/ML IV SOLN
INTRAVENOUS | Status: DC | PRN
  Administered 2016-11-09: 100 mg via INTRATRACHEAL

## 2016-11-09 MED ORDER — PROPOFOL 10 MG/ML IV BOLUS
INTRAVENOUS | Status: DC | PRN
  Administered 2016-11-09: 40 mg via INTRAVENOUS

## 2016-11-09 MED ORDER — PROPOFOL 500 MG/50ML IV EMUL
INTRAVENOUS | Status: DC | PRN
  Administered 2016-11-09: 125 ug/kg/min via INTRAVENOUS

## 2016-11-09 MED ORDER — LACTATED RINGERS IV SOLN
INTRAVENOUS | Status: DC
Start: 2016-11-09 — End: 2016-11-09
  Administered 2016-11-09: 10:00:00 via INTRAVENOUS

## 2016-11-09 MED ORDER — SODIUM CHLORIDE 0.9 % IV SOLN: INTRAVENOUS | Status: DC

## 2016-11-09 SURGICAL SUPPLY — 21 items

## 2016-11-09 NOTE — Anesthesia Procedure Notes (Signed)
Procedure Name: MAC Date/Time: 11/09/2016 9:57 AM Performed by: Lance Coon Pre-anesthesia Checklist: Patient identified, Emergency Drugs available, Suction available, Patient being monitored and Timeout performed Patient Re-evaluated:Patient Re-evaluated prior to induction Oxygen Delivery Method: Nasal cannula

## 2016-11-09 NOTE — Anesthesia Postprocedure Evaluation (Signed)
Anesthesia Post Note  Patient: Billy Horn  Procedure(s) Performed: Procedure(s) (LRB): COLONOSCOPY WITH PROPOFOL (N/A)     Patient location during evaluation: PACU Anesthesia Type: MAC Level of consciousness: awake and alert Pain management: pain level controlled Vital Signs Assessment: post-procedure vital signs reviewed and stable Respiratory status: spontaneous breathing, nonlabored ventilation and respiratory function stable Cardiovascular status: stable and blood pressure returned to baseline Anesthetic complications: no    Last Vitals:  Vitals:   11/09/16 1050 11/09/16 1105  BP: 139/76 (!) 167/73  Pulse: (!) 56 (!) 47  Resp: 13 11  Temp:      Last Pain:  Vitals:   11/09/16 1037  TempSrc: Oral                 [name],W. EDMOND

## 2016-11-09 NOTE — Op Note (Signed)
[facility] Patient Name: Billy Horn Procedure Date : 11/09/2016 MRN: [id number] Attending MD: Nancy Fetter Dr., MD Date of Birth: 01-20-1946 CSN: [id number] Age: 71 Admit Type: Outpatient Procedure:                Colonoscopy Indications:              Abnormal CT of the GI tract. The patient has been                            treated for diverticulitis with contain perforation                            and hepatic abscess treated with IV antibiotics.                            This is been completed and recent CT showed still                            some thickening around the sigmoid colon with the                            coming wall but no clear abscess in the hepatic                            abscess had resolved. Providers:                Joyice Faster. Oletta Lamas Dr., MD, Elmer Ramp. Tilden Dome, RN,                            William Dalton, Technician Referring MD:             Leighton Ruff, MD,John Bridget Hartshorn, MD Medicines:                Monitored Anesthesia Care Complications:            No immediate complications. Estimated Blood Loss:     Estimated blood loss: none. Procedure:                Pre-Anesthesia Assessment:                           - Prior to the procedure, a History and Physical                            was performed, and patient medications and                            allergies were reviewed. The patient's tolerance of                            previous anesthesia was also reviewed. The risks                            and benefits of the procedure and the sedation  options and risks were discussed with the patient.                            All questions were answered, and informed consent                            was obtained. Prior Anticoagulants: The patient has                            taken no previous anticoagulant or antiplatelet                            agents. ASA Grade Assessment: II - A patient with                             mild systemic disease. After reviewing the risks                            and benefits, the patient was deemed in                            satisfactory condition to undergo the procedure.                           After obtaining informed consent, the colonoscope                            was passed under direct vision. Throughout the                            procedure, the patient's blood pressure, pulse, and                            oxygen saturations were monitored continuously. The                            EC-3490LI ([id number]) scope was introduced through                            the anus and advanced to the the cecum, identified                            by appendiceal orifice and ileocecal valve. The                            EC-2990LI ([id number]) scope was introduced through                            the and advanced to the. The colonoscopy was                            somewhat difficult due to extrinsic compression and  multiple diverticula in the colon. Successful                            completion of the procedure was aided by changing                            endoscopes. we started with the pediatric                            colonoscope and the: was narrowed due to                            diverticular disease at approximately 30 cm from                            the anal verge and we were unable to easily pass                            this area. There was no bleeding or obvious                            exudate. I switched to the ultrathin colonoscope in                            the scope easily passed and we were able to advance                            to the cecum with identification of the windowsill                            orifice in the Aussie Val. The ileocecal valve,                            appendiceal orifice, and rectum were photographed.                            The quality of  the bowel preparation was good. Scope In: 10:00:36 AM Scope Out: 10:30:54 AM Scope Withdrawal Time: 0 hours 7 minutes 42 seconds  Total Procedure Duration: 0 hours 30 minutes 18 seconds  Findings:      The perianal and digital rectal examinations were normal.      The recto-sigmoid colon, descending colon, splenic flexure, transverse       colon, hepatic flexure and ascending colon appeared normal.      Multiple small and large-mouthed diverticula were found in the sigmoid       colon. There was no evidence of diverticular bleeding. the portion of       the colon was narrowed was approximately 30 cm from the anal verge and       was quite small. no bleeding or pus was seen.      The retroflexed view of the distal rectum and anal verge was normal and       showed no anal or rectal abnormalities. Impression:               -  The recto-sigmoid colon, descending colon,                            splenic flexure, transverse colon, hepatic flexure                            and ascending colon are normal.                           - Severe diverticulosis in the sigmoid colon. There                            was no evidence of diverticular bleeding.                           - The distal rectum and anal verge are normal on                            retroflexion view.                           - No specimens collected.                           - The examination was suspicious for diverticulitis. Moderate Sedation:      MAC by anesthesia Recommendation:           - Patient has a contact number available for                            emergencies. The signs and symptoms of potential                            delayed complications were discussed with the                            patient. Return to normal activities tomorrow.                            Written discharge instructions were provided to the                            patient.                           - Resume previous diet.                            - Continue present medications.                           - Refer to a surgeon at appointment to be scheduled.                           - Repeat colonoscopy in 10 years for screening  purposes. Procedure Code(s):        --- Professional ---                           [phone], Colonoscopy, flexible; diagnostic, including                            collection of specimen(s) by brushing or washing,                            when performed (separate procedure) Diagnosis Code(s):        --- Professional ---                           K57.30, Diverticulosis of large intestine without                            perforation or abscess without bleeding                           R93.3, Abnormal findings on diagnostic imaging of                            other parts of digestive tract CPT copyright 2016 [employer]. All rights reserved. The codes documented in this report are preliminary and upon coder review may  be revised to meet current compliance requirements. Nancy Fetter Dr., MD 11/09/2016 10:45:42 AM This report has been signed electronically. Number of Addenda: 0

## 2016-11-09 NOTE — Anesthesia Preprocedure Evaluation (Addendum)
Anesthesia Evaluation  Patient identified by MRN, date of birth, ID band Patient awake    Reviewed: Allergy & Precautions, H&P , NPO status , Patient's Chart, lab work & pertinent test results  Airway Mallampati: II  TM Distance: >3 FB Neck ROM: Full    Dental no notable dental hx. (+) Teeth Intact, Dental Advisory Given   Pulmonary neg pulmonary ROS,    Pulmonary exam normal breath sounds clear to auscultation       Cardiovascular negative cardio ROS   Rhythm:Regular Rate:Normal     Neuro/Psych negative neurological ROS  negative psych ROS   GI/Hepatic negative GI ROS, Neg liver ROS,   Endo/Other  diabetes  Renal/GU negative Renal ROS  negative genitourinary   Musculoskeletal   Abdominal   Peds  Hematology negative hematology ROS (+) anemia ,   Anesthesia Other Findings   Reproductive/Obstetrics negative OB ROS                            Anesthesia Physical Anesthesia Plan  ASA: II  Anesthesia Plan: MAC   Post-op Pain Management:    Induction: Intravenous  PONV Risk Score and Plan: 1 and Propofol and Treatment may vary due to age or medical condition  Airway Management Planned: Simple Face Mask  Additional Equipment:   Intra-op Plan:   Post-operative Plan:   Informed Consent: I have reviewed the patients History and Physical, chart, labs and discussed the procedure including the risks, benefits and alternatives for the proposed anesthesia with the patient or authorized representative who has indicated his/her understanding and acceptance.   Dental advisory given  Plan Discussed with: CRNA  Anesthesia Plan Comments:        Anesthesia Quick Evaluation

## 2016-11-09 NOTE — Discharge Instructions (Signed)

## 2016-11-09 NOTE — Transfer of Care (Signed)
Immediate Anesthesia Transfer of Care Note  Patient: Billy Horn  Procedure(s) Performed: Procedure(s): COLONOSCOPY WITH PROPOFOL (N/A)  Patient Location: Endoscopy Unit  Anesthesia Type:MAC  Level of Consciousness: awake and patient cooperative  Airway & Oxygen Therapy: Patient Spontanous Breathing  Post-op Assessment: Report given to RN and Post -op Vital signs reviewed and stable  Post vital signs: Reviewed and stable  Last Vitals:  Vitals:   11/09/16 0918  BP: (!) 155/84  Pulse: 61  Resp: 12    Last Pain:  Vitals:   11/09/16 0918  TempSrc: Oral         Complications: No apparent anesthesia complications

## 2016-11-09 NOTE — H&P (Signed)
Subjective:   Patient is a 71 y.o. male presents with Recent diverticulitis complicated by containing perforation intrahepatic abscess requiring drainage. Has been seen by general surgery. Consideration is being given to resection of the sigmoid colon. He has had several previous episodes of diverticulitis. The preoperative colonoscopies been requested. His last colonoscopy was about 8 years ago in a 10 year repeat was recommended.. Procedure including risks and benefits discussed in office.  Patient Active Problem List   Diagnosis Date Noted  . Colonic diverticular abscess 10/27/2016  . Sigmoid diverticulitis 10/01/2016  . Liver abscess 09/28/2016  . Anemia 09/28/2016  . Diabetes mellitus type 2, uncontrolled ([place]) 07/27/2012  . Narcotic induced constipation 07/27/2012  . Tibial plateau fracture 07/27/2012   Past Medical History:  Diagnosis Date  . Anemia   . Cancer [facility]) 2012   prostate cancer  . Diabetes mellitus    controlled with diet and exercise  . Liver abscess   . Pneumonia    as a child    Past Surgical History:  Procedure Laterality Date  . ORIF TIBIA PLATEAU Left 07/23/2012   Procedure: OPEN REDUCTION INTERNAL FIXATION (ORIF) TIBIAL PLATEAU;  Surgeon: Sharmon Revere, MD;  Location: [facility] ORS;  Service: Orthopedics;  Laterality: Left;  . ROBOT ASSISTED LAPAROSCOPIC RADICAL PROSTATECTOMY  04/06/2011   Procedure: ROBOTIC ASSISTED LAPAROSCOPIC RADICAL PROSTATECTOMY LEVEL 2;  Surgeon: Dutch Gray, MD;  Location: [facility] ORS;  Service: Urology;  Laterality: N/A;  Bilateral Pelvic Lymphadenectomy   . TONSILLECTOMY     as a child    Prescriptions Prior to Admission  Medication Sig Dispense Refill Last Dose  . acetaminophen (TYLENOL) 325 MG tablet Take 1-2 tablets (325-650 mg total) by mouth every 4 (four) hours as needed. (Patient taking differently: Take 325-650 mg by mouth every 4 (four) hours as needed for mild pain, moderate pain or headache. )   Past Week at Unknown time  .  aspirin EC 81 MG tablet Take 81 mg by mouth daily after breakfast.   11/08/2016 at Unknown time  . Calcium Carbonate (CALCIUM-CARB 600 PO) Take 1 tablet by mouth daily after breakfast.   11/08/2016 at Unknown time  . docusate sodium (COLACE) 100 MG capsule Take 1 capsule (100 mg total) by mouth 2 (two) times daily. 60 capsule 2 11/08/2016 at Unknown time  . ferrous sulfate 325 (65 FE) MG tablet Take 1 tablet (325 mg total) by mouth 2 (two) times daily with a meal. For anemia 60 tablet 0 Past Week at Unknown time  . Multiple Vitamin (MULTIVITAMIN WITH MINERALS) TABS Take 1 tablet by mouth daily after breakfast.    11/08/2016 at Unknown time  . polyethylene glycol (MIRALAX / GLYCOLAX) packet Take 17 g by mouth daily.   Past Week at Unknown time  . psyllium (METAMUCIL) 0.52 G capsule Take 1.04 g by mouth daily after breakfast.    11/08/2016 at Unknown time   No Known Allergies  Social History  Substance Use Topics  . Smoking status: Never Smoker  . Smokeless tobacco: Never Used  . Alcohol use Yes     Comment: occas wine    Family History  Problem Relation Age of Onset  . Lung cancer Mother 37     Objective:   Patient Vitals for the past 8 hrs:  BP Temp src Pulse Resp SpO2 Height Weight  11/09/16 0918 (!) 155/84 Oral 61 12 100 % 6' (1.829 m) 76.2 kg (168 lb)   No intake/output data recorded. No intake/output data recorded.  See MD Preop evaluation      Assessment:   1. Diverticulitis of the sigmoid colon with containing perforation and hepatic abscess that has been adequately treated  Plan:   1. We will go ahead and proceed with colonoscopy today and will have the patient follow-up later time with Dr. Marcello Moores.

## 2016-11-12 ENCOUNTER — Encounter (HOSPITAL_COMMUNITY): Payer: Self-pay | Admitting: Gastroenterology

## 2016-11-17 ENCOUNTER — Ambulatory Visit (INDEPENDENT_AMBULATORY_CARE_PROVIDER_SITE_OTHER): Payer: Medicare HMO | Admitting: Internal Medicine

## 2016-11-17 DIAGNOSIS — K5720 Diverticulitis of large intestine with perforation and abscess without bleeding: Secondary | ICD-10-CM | POA: Diagnosis not present

## 2016-11-17 DIAGNOSIS — K750 Abscess of liver: Secondary | ICD-10-CM | POA: Diagnosis not present

## 2016-11-17 NOTE — Progress Notes (Signed)
[place] for Infectious Disease  Patient Active Problem List   Diagnosis Date Noted  . Colonic diverticular abscess 10/27/2016    Priority: High  . Sigmoid diverticulitis 10/01/2016    Priority: High  . Liver abscess 09/28/2016    Priority: High  . Anemia 09/28/2016  . Diabetes mellitus type 2, uncontrolled ([place]) 07/27/2012  . Narcotic induced constipation 07/27/2012  . Tibial plateau fracture 07/27/2012    Patient's Medications  New Prescriptions   No medications on file  Previous Medications   ACETAMINOPHEN (TYLENOL) 325 MG TABLET    Take 1-2 tablets (325-650 mg total) by mouth every 4 (four) hours as needed.   ASPIRIN EC 81 MG TABLET    Take 81 mg by mouth daily after breakfast.   CALCIUM CARBONATE (CALCIUM-CARB 600 PO)    Take 1 tablet by mouth daily after breakfast.   DOCUSATE SODIUM (COLACE) 100 MG CAPSULE    Take 1 capsule (100 mg total) by mouth 2 (two) times daily.   FERROUS SULFATE 325 (65 FE) MG TABLET    Take 1 tablet (325 mg total) by mouth 2 (two) times daily with a meal. For anemia   MULTIPLE VITAMIN (MULTIVITAMIN WITH MINERALS) TABS    Take 1 tablet by mouth daily after breakfast.    POLYETHYLENE GLYCOL (MIRALAX / GLYCOLAX) PACKET    Take 17 g by mouth daily.   PSYLLIUM (METAMUCIL) 0.52 G CAPSULE    Take 1.04 g by mouth daily after breakfast.   Modified Medications   No medications on file  Discontinued Medications   No medications on file    Subjective: [name] is in for his routine follow-up visit. He completed 31 days of IV ertapenem on 10/27/2016 for his liver abscess and diverticulitis. He continues to feel better. He has not had any fever, chills, sweats or abdominal pain. His appetite is back to normal and he has resumed most of his usual activities. She underwent a follow-up CT scan on 11/04/2016 which showed resolution of the liver abscess and persistent diverticulosis. Colonoscopy on 11/09/2016 confirmed diverticulosis. He has a  visit with Dr. Leighton Ruff [facility] next week.  Review of Systems: Review of Systems  Constitutional: Negative for chills, diaphoresis and fever.  Gastrointestinal: Negative for abdominal pain, constipation, diarrhea, nausea and vomiting.    Past Medical History:  Diagnosis Date  . Anemia   . Cancer [facility]) 2012   prostate cancer  . Diabetes mellitus    controlled with diet and exercise  . Liver abscess   . Pneumonia    as a child    Social History  Substance Use Topics  . Smoking status: Never Smoker  . Smokeless tobacco: Never Used  . Alcohol use Yes     Comment: occas wine    Family History  Problem Relation Age of Onset  . Lung cancer Mother 42    No Known Allergies  Objective: Vitals:   11/17/16 0950  BP: (!) 151/90  Pulse: 68  Temp: 98 F (36.7 C)  TempSrc: Oral  Weight: 165 lb (74.8 kg)  Height: 6' (1.829 m)   Body mass index is 22.38 kg/m.  Physical Exam  Constitutional: He is oriented to person, place, and time.  He is well dressed and in good spirits.  Cardiovascular: Normal rate and regular rhythm.   No murmur heard. Pulmonary/Chest: Effort normal and breath sounds normal.  Abdominal: Soft. He exhibits no distension and no mass.  There is no tenderness.  Neurological: He is alert and oriented to person, place, and time.  Psychiatric: Mood and affect normal.    Lab Results  CT scan of abdomen and pelvis with contrast 11/04/2016  IMPRESSION: After placement of an abscess strain, the liver abscess has resolved.  Persistent thick walled fluid, gas, and stool collection extending from the sigmoid colon to the dome of the bladder. This either represents a chronic perforation which has been walled-off for a large diverticulum.  Improving wall thickening and inflammatory change of the sigmoid colon.  Stable abnormal appearance of the dome of the bladder as described representing either edema in the wall or fluid in the  wall of the dome.   Electronically Signed   By: Marybelle Killings M.D.   On: 11/04/2016 12:06  Colonoscopy 11/09/2016  The EC-[id number]) scope was introduced through the anus and advanced to the the cecum, identified by appendiceal orifice and ileocecal valve. The EC-[id number] ([id number]) scope was introduced through the and advanced to the. The colonoscopy was somewhat difficult due to extrinsic compression and multiple diverticula in the colon. Successful completion of the procedure was aided by changing endoscopes. we started with the pediatric colonoscope and the: was narrowed due to diverticular disease at approximately 30 cm from the anal verge and we were unable to easily pass this area. There was no bleeding or obvious exudate. I switched to the ultrathin colonoscope in the scope easily passed and we were able to advance to the cecum with identification of the windowsill orifice in the Aussie Val. The ileocecal valve, appendiceal orifice, and rectum were photographed. The quality of the bowel preparation was good.   Problem List Items Addressed This Visit      High   Colonic diverticular abscess    He probably had a contained perforation with early abscess formation complicating his diverticulitis. He has no current evidence of active infection.      Liver abscess    His liver abscess has resolved. He will continue off of antibiotics and follow-up here as needed.          Michel Bickers, MD [facility] [place] [facility] [phone] pager   [phone] cell 11/17/2016, 10:12 AM

## 2016-11-17 NOTE — Assessment & Plan Note (Signed)
He probably had a contained perforation with early abscess formation complicating his diverticulitis. He has no current evidence of active infection.

## 2016-11-17 NOTE — Assessment & Plan Note (Signed)
His liver abscess has resolved. He will continue off of antibiotics and follow-up here as needed.

## 2016-12-01 ENCOUNTER — Other Ambulatory Visit: Payer: Self-pay | Admitting: General Surgery

## 2016-12-01 DIAGNOSIS — K5730 Diverticulosis of large intestine without perforation or abscess without bleeding: Secondary | ICD-10-CM | POA: Diagnosis not present

## 2016-12-01 NOTE — H&P (Signed)
History of Present Illness Leighton Ruff MD; 4/40/3474 11:42 AM) The patient is a 71 year old male who presents with diverticulitis. 71 year old male who was hospitalized in June 2017 with diverticulitis and a liver abscess. He was placed on IV antibiotics and a percutaneous drain was placed in his liver. He was followed by infectious disease after his hospitalization. He is now cleared his liver infection and is working on recovery. He underwent a colonoscopy by Dr. Oletta Lamas last month which was normal. He is here today to discuss surgical resection to prevent future recurrences. He denies any abdominal pain and having regular bowel movements with the help of MiraLAX as he is on an iron supplement. He is getting his energy level back. He is gaining weight. His hemoglobin levels are increasing. He has a history of robotic prostate surgery with Dr. Alinda Money.   Past Surgical History Malachy Moan, [place]; 12/01/2016 11:21 AM) No pertinent past surgical history  Diagnostic Studies History Malachy Moan, RMA; 12/01/2016 11:21 AM) Colonoscopy within last year  Allergies Malachy Moan, RMA; 12/01/2016 11:22 AM) No Known Allergies 12/01/2016  Medication History Malachy Moan, RMA; 12/01/2016 11:25 AM) Meloxicam (15MG  Tablet, Oral) Active. Acetaminophen (325MG  Tablet, Oral) Active. Aspirin (81MG  Tablet Chewable, Oral) Active. Iron (325 (65 Fe)MG Tablet, Oral) Active. Colace (100MG  Capsule, Oral) Active. Metamucil (0.52GM Capsule, Oral) Active. Calcium Carbonate-Iron (Oral) Specific strength unknown - Active. Multivitamin Adults (Oral) Active. Medications Reconciled  Social History Malachy Moan, [place]; 12/01/2016 11:21 AM) Alcohol use Occasional alcohol use. Caffeine use Carbonated beverages, Coffee, Tea. No drug use Tobacco use Never smoker.  Family History Malachy Moan, [place]; 12/01/2016 11:21 AM) Alcohol Abuse Father. Cancer Mother. Respiratory  Condition Mother.  Other Problems Malachy Moan, RMA; 12/01/2016 11:21 AM) Diabetes Mellitus Diverticulosis Prostate Cancer     Review of Systems Malachy Moan RMA; 12/01/2016 11:21 AM) General Not Present- Appetite Loss, Chills, Fatigue, Fever, Night Sweats, Weight Gain and Weight Loss. Skin Not Present- Change in Wart/Mole, Dryness, Hives, Jaundice, New Lesions, Non-Healing Wounds, Rash and Ulcer. HEENT Present- Hearing Loss and Wears glasses/contact lenses. Not Present- Earache, Hoarseness, Nose Bleed, Oral Ulcers, Ringing in the Ears, Seasonal Allergies, Sinus Pain, Sore Throat, Visual Disturbances and Yellow Eyes. Respiratory Not Present- Bloody sputum, Chronic Cough, Difficulty Breathing, Snoring and Wheezing. Breast Not Present- Breast Mass, Breast Pain, Nipple Discharge and Skin Changes. Cardiovascular Not Present- Chest Pain, Difficulty Breathing Lying Down, Leg Cramps, Palpitations, Rapid Heart Rate, Shortness of Breath and Swelling of Extremities. Gastrointestinal Not Present- Abdominal Pain, Bloating, Bloody Stool, Change in Bowel Habits, Chronic diarrhea, Constipation, Difficulty Swallowing, Excessive gas, Gets full quickly at meals, Hemorrhoids, Indigestion, Nausea, Rectal Pain and Vomiting. Male Genitourinary Present- Nocturia. Not Present- Blood in Urine, Change in Urinary Stream, Frequency, Impotence, Painful Urination, Urgency and Urine Leakage.  Vitals Malachy Moan RMA; 12/01/2016 11:26 AM) 12/01/2016 11:25 AM Weight: 172.8 lb Height: 72in Body Surface Area: 2 m Body Mass Index: 23.44 kg/m  Temp.: 97.32F  Pulse: 71 (Regular)  BP: 150/72 (Sitting, Left Arm, Standard)      Physical Exam Leighton Ruff MD; 2/59/5638 11:43 AM)  General Mental Status-Alert. General Appearance-Not in acute distress. Build & Nutrition-Well nourished. Posture-Normal posture. Gait-Normal.  Head and Neck Head-normocephalic, atraumatic with no  lesions or palpable masses. Trachea-midline.  Chest and Lung Exam Chest and lung exam reveals -on auscultation, normal breath sounds, no adventitious sounds and normal vocal resonance.  Cardiovascular Cardiovascular examination reveals -normal heart sounds, regular rate and rhythm with no murmurs and no digital  clubbing, cyanosis, edema, increased warmth or tenderness.  Abdomen Inspection Inspection of the abdomen reveals - No Hernias. Palpation/Percussion Palpation and Percussion of the abdomen reveal - Soft, Non Tender, No Rigidity (guarding), No hepatosplenomegaly and No Palpable abdominal masses.  Neurologic Neurologic evaluation reveals -alert and oriented x 3 with no impairment of recent or remote memory, normal attention span and ability to concentrate, normal sensation and normal coordination.  Musculoskeletal Normal Exam - Bilateral-Upper Extremity Strength Normal and Lower Extremity Strength Normal.    Assessment & Plan Leighton Ruff MD; 11/27/9831 11:43 AM)  DIVERTICULAR DISEASE OF COLON (K57.30) Impression: 71 year old male who presented to [facility] with fevers and abdominal pain. He was found to have a left-sided liver abscess. He underwent percutaneous drainage and continued on IV antibiotics for several weeks. He is now recovering from this. He is starting to regain his weight and energy levels. The patient had diverticulitis seen on CT scan which was felt to be the cause of his liver abscess. He is here today to discuss resection of the sigmoid colon to prevent future recurrences. The surgery and anatomy were described to the patient as well as the risks of surgery and the possible complications. These include: Bleeding, deep abdominal infections and possible wound complications such as hernia and infection, damage to adjacent structures, leak of surgical connections, which can lead to other surgeries and possibly an ostomy, possible need for other  procedures, such as abscess drains in radiology, possible prolonged hospital stay, possible diarrhea from removal of part of the colon, possible constipation from narcotics, possible bowel, bladder or sexual dysfunction if having rectal surgery, prolonged fatigue/weakness or appetite loss, possible early recurrence of of disease, possible complications of their medical problems such as heart disease or arrhythmias or lung problems, death (less than 1%). I believe the patient understands and wishes to proceed with the surgery.

## 2016-12-07 DIAGNOSIS — D649 Anemia, unspecified: Secondary | ICD-10-CM | POA: Diagnosis not present

## 2016-12-17 DIAGNOSIS — R69 Illness, unspecified: Secondary | ICD-10-CM | POA: Diagnosis not present

## 2016-12-31 NOTE — Progress Notes (Addendum)
LOV Dr Donnie Coffin 10-12-16 on chart   CBCdiff 12-07-16 on chart from [place]   EKG 09-27-16 epic  CXR 09-27-16 epic

## 2016-12-31 NOTE — Patient Instructions (Signed)
Billy Horn  12/31/2016   Your procedure is scheduled on: 01-14-17  Report to [facility] Main  Entrance Take [place]  elevators to 3rd floor to  [place] at 530AM.    Call this number if you have problems the morning of surgery [phone]    Remember: ONLY 1 PERSON MAY GO WITH YOU TO SHORT STAY TO GET  READY MORNING OF YOUR SURGERY.  Do not eat food After Midnight on Tuesday 01-12-17. Drink plenty of clear liquids all day Wednesday 01-13-17 and follow all bowel prep instructions given to you by your surgeon. Nothing by mouth after midnight on Wednesday!      Take these medicines the morning of surgery with A SIP OF WATER: NONE                                You may not have any metal on your body including hair pins and              piercings  Do not wear jewelry, make-up, lotions, powders or perfumes, deodorant                    Men may shave face and neck.   Do not bring valuables to the hospital. [place].  Contacts, dentures or bridgework may not be worn into surgery.  Leave suitcase in the car. After surgery it may be brought to your room.                 Please read over the following fact sheets you were given: _____________________________________________________________________   CLEAR LIQUID DIET   Foods Allowed                                                                     Foods Excluded  Coffee and tea, regular and decaf                             liquids that you cannot  Plain Jell-O in any flavor                                             see through such as: Fruit ices (not with fruit pulp)                                     milk, soups, orange juice  Iced Popsicles                                    All solid food Carbonated beverages, regular and diet  Cranberry, grape and apple juices Sports drinks like Gatorade Lightly seasoned  clear broth or consume(fat free) Sugar, honey syrup  Sample Menu Breakfast                                Lunch                                     Supper Cranberry juice                    Beef broth                            Chicken broth Jell-O                                     Grape juice                           Apple juice Coffee or tea                        Jell-O                                      Popsicle                                                Coffee or tea                        Coffee or tea  _____________________________________________________________________  How to Manage Your Diabetes Before and After Surgery  Why is it important to control my blood sugar before and after surgery? . Improving blood sugar levels before and after surgery helps healing and can limit problems. . A way of improving blood sugar control is eating a healthy diet by: o  Eating less sugar and carbohydrates o  Increasing activity/exercise o  Talking with your doctor about reaching your blood sugar goals . High blood sugars (greater than 180 mg/dL) can raise your risk of infections and slow your recovery, so you will need to focus on controlling your diabetes during the weeks before surgery. . Make sure that the doctor who takes care of your diabetes knows about your planned surgery including the date and location.  How do I manage my blood sugar before surgery? . Check your blood sugar at least 4 times a day, starting 2 days before surgery, to make sure that the level is not too high or low. o Check your blood sugar the morning of your surgery when you wake up and every 2 hours until you get to the Short Stay unit. . If your blood sugar is less than 70 mg/dL, you will need to treat for low blood sugar: o Do not take insulin. o Treat a low blood sugar (less than 70 mg/dL) with  cup of clear juice (cranberry or apple), 4 glucose tablets, OR glucose gel. o  Recheck blood sugar in 15  minutes after treatment (to make sure it is greater than 70 mg/dL). If your blood sugar is not greater than 70 mg/dL on recheck, call [phone] for further instructions. . Report your blood sugar to the short stay nurse when you get to Short Stay.  . If you are admitted to the hospital after surgery: o Your blood sugar will be checked by the staff and you will probably be given insulin after surgery (instead of oral diabetes medicines) to make sure you have good blood sugar levels. o The goal for blood sugar control after surgery is 80-180 mg/dL.    Patient Signature:  Date:   Nurse Signature:  Date:   Reviewed and Endorsed by [facility] Patient Education Committee, August 2015  [facility] - Preparing for Surgery Before surgery, you can play an important role.  Because skin is not sterile, your skin needs to be as free of germs as possible.  You can reduce the number of germs on your skin by washing with CHG (chlorahexidine gluconate) soap before surgery.  CHG is an antiseptic cleaner which kills germs and bonds with the skin to continue killing germs even after washing. Please DO NOT use if you have an allergy to CHG or antibacterial soaps.  If your skin becomes reddened/irritated stop using the CHG and inform your nurse when you arrive at Short Stay. Do not shave (including legs and underarms) for at least 48 hours prior to the first CHG shower.  You may shave your face/neck. Please follow these instructions carefully:  1.  Shower with CHG Soap the night before surgery and the  morning of Surgery.  2.  If you choose to wash your hair, wash your hair first as usual with your  normal  shampoo.  3.  After you shampoo, rinse your hair and body thoroughly to remove the  shampoo.                           4.  Use CHG as you would any other liquid soap.  You can apply chg directly  to the skin and wash                       Gently with a scrungie or clean washcloth.  5.  Apply the CHG Soap to  your body ONLY FROM THE NECK DOWN.   Do not use on face/ open                           Wound or open sores. Avoid contact with eyes, ears mouth and genitals (private parts).                       Wash face,  Genitals (private parts) with your normal soap.             6.  Wash thoroughly, paying special attention to the area where your surgery  will be performed.  7.  Thoroughly rinse your body with warm water from the neck down.  8.  DO NOT shower/wash with your normal soap after using and rinsing off  the CHG Soap.                9.  Pat yourself dry with a clean towel.            10.  Wear clean  pajamas.            11.  Place clean sheets on your bed the night of your first shower and do not  sleep with pets. Day of Surgery : Do not apply any lotions/deodorants the morning of surgery.  Please wear clean clothes to the hospital/surgery center.  FAILURE TO FOLLOW THESE INSTRUCTIONS MAY RESULT IN THE CANCELLATION OF YOUR SURGERY PATIENT SIGNATURE_________________________________  NURSE SIGNATURE__________________________________  ________________________________________________________________________   Adam Phenix  An incentive spirometer is a tool that can help keep your lungs clear and active. This tool measures how well you are filling your lungs with each breath. Taking long deep breaths may help reverse or decrease the chance of developing breathing (pulmonary) problems (especially infection) following:  A long period of time when you are unable to move or be active. BEFORE THE PROCEDURE   If the spirometer includes an indicator to show your best effort, your nurse or respiratory therapist will set it to a desired goal.  If possible, sit up straight or lean slightly forward. Try not to slouch.  Hold the incentive spirometer in an upright position. INSTRUCTIONS FOR USE  1. Sit on the edge of your bed if possible, or sit up as far as you can in bed or on a  chair. 2. Hold the incentive spirometer in an upright position. 3. Breathe out normally. 4. Place the mouthpiece in your mouth and seal your lips tightly around it. 5. Breathe in slowly and as deeply as possible, raising the piston or the ball toward the top of the column. 6. Hold your breath for 3-5 seconds or for as long as possible. Allow the piston or ball to fall to the bottom of the column. 7. Remove the mouthpiece from your mouth and breathe out normally. 8. Rest for a few seconds and repeat Steps 1 through 7 at least 10 times every 1-2 hours when you are awake. Take your time and take a few normal breaths between deep breaths. 9. The spirometer may include an indicator to show your best effort. Use the indicator as a goal to work toward during each repetition. 10. After each set of 10 deep breaths, practice coughing to be sure your lungs are clear. If you have an incision (the cut made at the time of surgery), support your incision when coughing by placing a pillow or rolled up towels firmly against it. Once you are able to get out of bed, walk around indoors and cough well. You may stop using the incentive spirometer when instructed by your caregiver.  RISKS AND COMPLICATIONS  Take your time so you do not get dizzy or light-headed.  If you are in pain, you may need to take or ask for pain medication before doing incentive spirometry. It is harder to take a deep breath if you are having pain. AFTER USE  Rest and breathe slowly and easily.  It can be helpful to keep track of a log of your progress. Your caregiver can provide you with a simple table to help with this. If you are using the spirometer at home, follow these instructions: [place] IF:   You are having difficultly using the spirometer.  You have trouble using the spirometer as often as instructed.  Your pain medication is not giving enough relief while using the spirometer.  You develop fever of 100.5 F  (38.1 C) or higher. SEEK IMMEDIATE MEDICAL CARE IF:   You cough up bloody sputum that had not  been present before.  You develop fever of 102 F (38.9 C) or greater.  You develop worsening pain at or near the incision site. MAKE SURE YOU:   Understand these instructions.  Will watch your condition.  Will get help right away if you are not doing well or get worse. Document Released: 08/17/2006 Document Revised: 06/29/2011 Document Reviewed: 10/18/2006 [facility] Patient Information 2014 [place], [place].   ________________________________________________________________________

## 2017-01-04 ENCOUNTER — Encounter (HOSPITAL_COMMUNITY): Payer: Self-pay

## 2017-01-04 ENCOUNTER — Encounter (INDEPENDENT_AMBULATORY_CARE_PROVIDER_SITE_OTHER): Payer: Self-pay

## 2017-01-04 ENCOUNTER — Encounter (HOSPITAL_COMMUNITY)
Admission: RE | Admit: 2017-01-04 | Discharge: 2017-01-04 | Disposition: A | Discharge status: Home / Self Care | Payer: Medicare HMO | Source: Ambulatory Visit | Attending: General Surgery | Admitting: General Surgery

## 2017-01-04 DIAGNOSIS — K5790 Diverticulosis of intestine, part unspecified, without perforation or abscess without bleeding: Secondary | ICD-10-CM | POA: Diagnosis not present

## 2017-01-04 DIAGNOSIS — Z01818 Encounter for other preprocedural examination: Secondary | ICD-10-CM | POA: Diagnosis present

## 2017-01-04 LAB — COMPREHENSIVE METABOLIC PANEL
ALT: 16 U/L — AB (ref 17–63)
AST: 20 U/L (ref 15–41)
Albumin: 3.8 g/dL (ref 3.5–5.0)
Alkaline Phosphatase: 87 U/L (ref 38–126)
Anion gap: 6 (ref 5–15)
BUN: 24 mg/dL — ABNORMAL HIGH (ref 6–20)
CO2: 28 mmol/L (ref 22–32)
CREATININE: 1.16 mg/dL (ref 0.61–1.24)
Calcium: 8.9 mg/dL (ref 8.9–10.3)
Chloride: 105 mmol/L (ref 101–111)
Glucose, Bld: 139 mg/dL — ABNORMAL HIGH (ref 65–99)
Potassium: 4.7 mmol/L (ref 3.5–5.1)
Sodium: 139 mmol/L (ref 135–145)
Total Bilirubin: 0.3 mg/dL (ref 0.3–1.2)
Total Protein: 7.1 g/dL (ref 6.5–8.1)

## 2017-01-04 LAB — HEMOGLOBIN A1C
HEMOGLOBIN A1C: 5.9 % — AB (ref 4.8–5.6)
MEAN PLASMA GLUCOSE: 122.63 mg/dL

## 2017-01-04 LAB — CBC
HCT: 34.5 % — ABNORMAL LOW (ref 39.0–52.0)
Hemoglobin: 11.9 g/dL — ABNORMAL LOW (ref 13.0–17.0)
MCH: 29.5 pg (ref 26.0–34.0)
MCHC: 34.5 g/dL (ref 30.0–36.0)
MCV: 85.4 fL (ref 78.0–100.0)
PLATELETS: 200 10*3/uL (ref 150–400)
RBC: 4.04 MIL/uL — AB (ref 4.22–5.81)
RDW: 13.5 % (ref 11.5–15.5)
WBC: 5.4 10*3/uL (ref 4.0–10.5)

## 2017-01-13 ENCOUNTER — Encounter (HOSPITAL_COMMUNITY): Payer: Self-pay | Admitting: Anesthesiology

## 2017-01-13 NOTE — Anesthesia Preprocedure Evaluation (Addendum)
Anesthesia Evaluation  Patient identified by MRN, date of birth, ID band Patient awake    Reviewed: Allergy & Precautions, NPO status , Patient's Chart, lab work & pertinent test results  Airway Mallampati: II  TM Distance: >3 FB Neck ROM: Full    Dental  (+) Chipped, Dental Advisory Given,    Pulmonary neg pulmonary ROS,    breath sounds clear to auscultation       Cardiovascular negative cardio ROS   Rhythm:Regular Rate:Normal     Neuro/Psych negative neurological ROS     GI/Hepatic negative GI ROS, Neg liver ROS,   Endo/Other  negative endocrine ROSdiabetes, Type 2, Oral Hypoglycemic Agents  Renal/GU negative Renal ROS     Musculoskeletal negative musculoskeletal ROS (+)   Abdominal   Peds  Hematology negative hematology ROS (+)   Anesthesia Other Findings Day of surgery medications reviewed with the patient.  Reproductive/Obstetrics                            Anesthesia Physical Anesthesia Plan  ASA: II  Anesthesia Plan: General   Post-op Pain Management:    Induction: Intravenous  PONV Risk Score and Plan: 3 and Ondansetron, Dexamethasone, Midazolam and Treatment may vary due to age or medical condition  Airway Management Planned: Oral ETT  Additional Equipment:   Intra-op Plan:   Post-operative Plan: Extubation in OR  Informed Consent: I have reviewed the patients History and Physical, chart, labs and discussed the procedure including the risks, benefits and alternatives for the proposed anesthesia with the patient or authorized representative who has indicated his/her understanding and acceptance.   Dental advisory given  Plan Discussed with: CRNA  Anesthesia Plan Comments:         Anesthesia Quick Evaluation

## 2017-01-14 ENCOUNTER — Inpatient Hospital Stay (HOSPITAL_COMMUNITY): Payer: Medicare HMO | Admitting: Anesthesiology

## 2017-01-14 ENCOUNTER — Inpatient Hospital Stay (HOSPITAL_COMMUNITY)
Admission: RE | Admit: 2017-01-14 | Discharge: 2017-01-18 | DRG: 329 | Disposition: A | Discharge status: Home / Self Care | Payer: Medicare HMO | Source: Ambulatory Visit | Attending: General Surgery | Admitting: General Surgery

## 2017-01-14 ENCOUNTER — Encounter (HOSPITAL_COMMUNITY): Payer: Self-pay

## 2017-01-14 ENCOUNTER — Encounter (HOSPITAL_COMMUNITY)
Admission: RE | Disposition: A | Discharge status: Home / Self Care | Payer: Self-pay | Source: Ambulatory Visit | Attending: General Surgery

## 2017-01-14 DIAGNOSIS — Z791 Long term (current) use of non-steroidal anti-inflammatories (NSAID): Secondary | ICD-10-CM | POA: Diagnosis not present

## 2017-01-14 DIAGNOSIS — Z833 Family history of diabetes mellitus: Secondary | ICD-10-CM | POA: Diagnosis not present

## 2017-01-14 DIAGNOSIS — Z8042 Family history of malignant neoplasm of prostate: Secondary | ICD-10-CM | POA: Diagnosis not present

## 2017-01-14 DIAGNOSIS — K5720 Diverticulitis of large intestine with perforation and abscess without bleeding: Principal | ICD-10-CM | POA: Diagnosis present

## 2017-01-14 DIAGNOSIS — E119 Type 2 diabetes mellitus without complications: Secondary | ICD-10-CM | POA: Diagnosis present

## 2017-01-14 DIAGNOSIS — Z711 Person with feared health complaint in whom no diagnosis is made: Secondary | ICD-10-CM | POA: Diagnosis not present

## 2017-01-14 DIAGNOSIS — K750 Abscess of liver: Secondary | ICD-10-CM | POA: Diagnosis present

## 2017-01-14 DIAGNOSIS — K5732 Diverticulitis of large intestine without perforation or abscess without bleeding: Secondary | ICD-10-CM | POA: Diagnosis present

## 2017-01-14 DIAGNOSIS — Z23 Encounter for immunization: Secondary | ICD-10-CM

## 2017-01-14 DIAGNOSIS — Z811 Family history of alcohol abuse and dependence: Secondary | ICD-10-CM | POA: Diagnosis not present

## 2017-01-14 DIAGNOSIS — Z7982 Long term (current) use of aspirin: Secondary | ICD-10-CM

## 2017-01-14 DIAGNOSIS — I10 Essential (primary) hypertension: Secondary | ICD-10-CM | POA: Diagnosis present

## 2017-01-14 DIAGNOSIS — K5790 Diverticulosis of intestine, part unspecified, without perforation or abscess without bleeding: Secondary | ICD-10-CM | POA: Diagnosis present

## 2017-01-14 DIAGNOSIS — K5730 Diverticulosis of large intestine without perforation or abscess without bleeding: Secondary | ICD-10-CM | POA: Diagnosis not present

## 2017-01-14 DIAGNOSIS — S3729XA Other injury of bladder, initial encounter: Secondary | ICD-10-CM | POA: Diagnosis not present

## 2017-01-14 DIAGNOSIS — R69 Illness, unspecified: Secondary | ICD-10-CM | POA: Diagnosis not present

## 2017-01-14 DIAGNOSIS — D649 Anemia, unspecified: Secondary | ICD-10-CM | POA: Diagnosis not present

## 2017-01-14 LAB — GLUCOSE, CAPILLARY
GLUCOSE-CAPILLARY: 139 mg/dL — AB (ref 65–99)
GLUCOSE-CAPILLARY: 195 mg/dL — AB (ref 65–99)

## 2017-01-14 SURGERY — COLECTOMY, PARTIAL, ROBOT-ASSISTED, LAPAROSCOPIC
Anesthesia: General | Site: Abdomen

## 2017-01-14 MED ORDER — LIDOCAINE 2% (20 MG/ML) 5 ML SYRINGE
INTRAMUSCULAR | Status: DC | PRN
  Administered 2017-01-14: 40 mg via INTRAVENOUS

## 2017-01-14 MED ORDER — GABAPENTIN 300 MG PO CAPS
300.0000 mg | ORAL_CAPSULE | Freq: Two times a day (BID) | ORAL | Status: DC
  Administered 2017-01-14 – 2017-01-18 (×9): 300 mg via ORAL
  Filled 2017-01-14 (×9): qty 1

## 2017-01-14 MED ORDER — EPHEDRINE SULFATE-NACL 50-0.9 MG/10ML-% IV SOSY
PREFILLED_SYRINGE | INTRAVENOUS | Status: DC | PRN
  Administered 2017-01-14: 10 mg via INTRAVENOUS

## 2017-01-14 MED ORDER — ACETAMINOPHEN 500 MG PO TABS
1000.0000 mg | ORAL_TABLET | ORAL | Status: AC
  Administered 2017-01-14: 1000 mg via ORAL
  Filled 2017-01-14: qty 2

## 2017-01-14 MED ORDER — KCL IN DEXTROSE-NACL 20-5-0.45 MEQ/L-%-% IV SOLN
INTRAVENOUS | Status: DC
  Administered 2017-01-14 – 2017-01-17 (×6): via INTRAVENOUS
  Filled 2017-01-14 (×9): qty 1000

## 2017-01-14 MED ORDER — BUPIVACAINE LIPOSOME 1.3 % IJ SUSP
20.0000 mL | INTRAMUSCULAR | Status: DC
  Filled 2017-01-14: qty 20

## 2017-01-14 MED ORDER — ONDANSETRON HCL 4 MG/2ML IJ SOLN
INTRAMUSCULAR | Status: AC
  Filled 2017-01-14: qty 2

## 2017-01-14 MED ORDER — ONDANSETRON HCL 4 MG PO TABS: 4.0000 mg | ORAL_TABLET | Freq: Four times a day (QID) | ORAL | Status: DC | PRN

## 2017-01-14 MED ORDER — FENTANYL CITRATE (PF) 100 MCG/2ML IJ SOLN
INTRAMUSCULAR | Status: AC
  Filled 2017-01-14: qty 2

## 2017-01-14 MED ORDER — DEXAMETHASONE SODIUM PHOSPHATE 10 MG/ML IJ SOLN
INTRAMUSCULAR | Status: DC | PRN
  Administered 2017-01-14: 8 mg via INTRAVENOUS

## 2017-01-14 MED ORDER — LIDOCAINE HCL 2 % IJ SOLN
INTRAMUSCULAR | Status: AC
  Filled 2017-01-14: qty 20

## 2017-01-14 MED ORDER — GABAPENTIN 300 MG PO CAPS
300.0000 mg | ORAL_CAPSULE | ORAL | Status: AC
  Administered 2017-01-14: 300 mg via ORAL
  Filled 2017-01-14: qty 1

## 2017-01-14 MED ORDER — HEPARIN SODIUM (PORCINE) 5000 UNIT/ML IJ SOLN
5000.0000 [IU] | Freq: Once | INTRAMUSCULAR | Status: AC
  Administered 2017-01-14: 5000 [IU] via SUBCUTANEOUS
  Filled 2017-01-14: qty 1

## 2017-01-14 MED ORDER — DIPHENHYDRAMINE HCL 50 MG/ML IJ SOLN
12.5000 mg | Freq: Four times a day (QID) | INTRAMUSCULAR | Status: DC | PRN
  Administered 2017-01-15 – 2017-01-17 (×3): 12.5 mg via INTRAVENOUS
  Filled 2017-01-14 (×3): qty 1

## 2017-01-14 MED ORDER — ROCURONIUM BROMIDE 10 MG/ML (PF) SYRINGE
PREFILLED_SYRINGE | INTRAVENOUS | Status: DC | PRN
  Administered 2017-01-14: 20 mg via INTRAVENOUS
  Administered 2017-01-14: 50 mg via INTRAVENOUS
  Administered 2017-01-14: 20 mg via INTRAVENOUS

## 2017-01-14 MED ORDER — MORPHINE SULFATE (PF) 2 MG/ML IV SOLN
2.0000 mg | INTRAVENOUS | Status: DC | PRN
  Administered 2017-01-14 (×2): 2 mg via INTRAVENOUS
  Administered 2017-01-14: 4 mg via INTRAVENOUS
  Administered 2017-01-15 – 2017-01-16 (×2): 2 mg via INTRAVENOUS
  Filled 2017-01-14: qty 2
  Filled 2017-01-14 (×4): qty 1

## 2017-01-14 MED ORDER — PROPOFOL 10 MG/ML IV BOLUS
INTRAVENOUS | Status: DC | PRN
  Administered 2017-01-14: 40 mg via INTRAVENOUS
  Administered 2017-01-14: 160 mg via INTRAVENOUS

## 2017-01-14 MED ORDER — LACTATED RINGERS IV SOLN
INTRAVENOUS | Status: DC | PRN
  Administered 2017-01-14 (×3): via INTRAVENOUS

## 2017-01-14 MED ORDER — ONDANSETRON HCL 4 MG/2ML IJ SOLN: 4.0000 mg | Freq: Four times a day (QID) | INTRAMUSCULAR | Status: DC | PRN

## 2017-01-14 MED ORDER — DIPHENHYDRAMINE HCL 12.5 MG/5ML PO ELIX
12.5000 mg | ORAL_SOLUTION | Freq: Four times a day (QID) | ORAL | Status: DC | PRN
Start: 2017-01-14 — End: 2017-01-18

## 2017-01-14 MED ORDER — DEXAMETHASONE SODIUM PHOSPHATE 10 MG/ML IJ SOLN
INTRAMUSCULAR | Status: AC
  Filled 2017-01-14: qty 1

## 2017-01-14 MED ORDER — LACTATED RINGERS IV SOLN: INTRAVENOUS | Status: DC

## 2017-01-14 MED ORDER — ROCURONIUM BROMIDE 50 MG/5ML IV SOSY
PREFILLED_SYRINGE | INTRAVENOUS | Status: AC
  Filled 2017-01-14: qty 10

## 2017-01-14 MED ORDER — ONDANSETRON HCL 4 MG/2ML IJ SOLN
INTRAMUSCULAR | Status: DC | PRN
  Administered 2017-01-14: 4 mg via INTRAVENOUS

## 2017-01-14 MED ORDER — ALUM & MAG HYDROXIDE-SIMETH 200-200-20 MG/5ML PO SUSP
30.0000 mL | Freq: Four times a day (QID) | ORAL | Status: DC | PRN
  Administered 2017-01-14: 30 mL via ORAL
  Filled 2017-01-14: qty 30

## 2017-01-14 MED ORDER — STERILE WATER FOR IRRIGATION IR SOLN
Status: DC | PRN
  Administered 2017-01-14: 1000 mL via INTRAVESICAL

## 2017-01-14 MED ORDER — SUGAMMADEX SODIUM 200 MG/2ML IV SOLN
INTRAVENOUS | Status: AC
  Filled 2017-01-14: qty 2

## 2017-01-14 MED ORDER — BUPIVACAINE-EPINEPHRINE (PF) 0.25% -1:200000 IJ SOLN
INTRAMUSCULAR | Status: AC
  Filled 2017-01-14: qty 30

## 2017-01-14 MED ORDER — LIDOCAINE 2% (20 MG/ML) 5 ML SYRINGE
INTRAMUSCULAR | Status: AC
  Filled 2017-01-14: qty 10

## 2017-01-14 MED ORDER — LACTATED RINGERS IR SOLN
Status: DC | PRN
  Administered 2017-01-14: 2000 mL

## 2017-01-14 MED ORDER — METHYLENE BLUE 0.5 % INJ SOLN
INTRAVENOUS | Status: DC | PRN
  Administered 2017-01-14: 10 mL via SUBMUCOSAL

## 2017-01-14 MED ORDER — KETAMINE HCL 10 MG/ML IJ SOLN
INTRAMUSCULAR | Status: DC | PRN
  Administered 2017-01-14: 40 mg via INTRAVENOUS
  Administered 2017-01-14: 10 mg via INTRAVENOUS

## 2017-01-14 MED ORDER — FENTANYL CITRATE (PF) 100 MCG/2ML IJ SOLN
25.0000 ug | INTRAMUSCULAR | Status: DC | PRN
  Administered 2017-01-14: 50 ug via INTRAVENOUS

## 2017-01-14 MED ORDER — PROMETHAZINE HCL 25 MG/ML IJ SOLN: 6.2500 mg | INTRAMUSCULAR | Status: DC | PRN

## 2017-01-14 MED ORDER — ENOXAPARIN SODIUM 40 MG/0.4ML ~~LOC~~ SOLN
40.0000 mg | SUBCUTANEOUS | Status: DC
  Administered 2017-01-15 – 2017-01-18 (×4): 40 mg via SUBCUTANEOUS
  Filled 2017-01-14 (×4): qty 0.4

## 2017-01-14 MED ORDER — FENTANYL CITRATE (PF) 250 MCG/5ML IJ SOLN
INTRAMUSCULAR | Status: DC | PRN
  Administered 2017-01-14 (×5): 50 ug via INTRAVENOUS

## 2017-01-14 MED ORDER — LIDOCAINE 2% (20 MG/ML) 5 ML SYRINGE
INTRAMUSCULAR | Status: AC
  Filled 2017-01-14: qty 15

## 2017-01-14 MED ORDER — ALVIMOPAN 12 MG PO CAPS
12.0000 mg | ORAL_CAPSULE | ORAL | Status: AC
  Administered 2017-01-14: 12 mg via ORAL
  Filled 2017-01-14: qty 1

## 2017-01-14 MED ORDER — ALVIMOPAN 12 MG PO CAPS
12.0000 mg | ORAL_CAPSULE | Freq: Two times a day (BID) | ORAL | Status: DC
  Administered 2017-01-15 – 2017-01-17 (×6): 12 mg via ORAL
  Filled 2017-01-14 (×6): qty 1

## 2017-01-14 MED ORDER — METHYLENE BLUE 0.5 % INJ SOLN
INTRAVENOUS | Status: AC
  Filled 2017-01-14: qty 10

## 2017-01-14 MED ORDER — 0.9 % SODIUM CHLORIDE (POUR BTL) OPTIME
TOPICAL | Status: DC | PRN
  Administered 2017-01-14: 2000 mL

## 2017-01-14 MED ORDER — MEPERIDINE HCL 50 MG/ML IJ SOLN: 6.2500 mg | INTRAMUSCULAR | Status: DC | PRN

## 2017-01-14 MED ORDER — DEXTROSE 5 % IV SOLN
2.0000 g | Freq: Two times a day (BID) | INTRAVENOUS | Status: AC
  Administered 2017-01-14: 2 g via INTRAVENOUS
  Filled 2017-01-14: qty 2

## 2017-01-14 MED ORDER — BUPIVACAINE-EPINEPHRINE 0.25% -1:200000 IJ SOLN
INTRAMUSCULAR | Status: DC | PRN
  Administered 2017-01-14: 30 mL

## 2017-01-14 MED ORDER — LIDOCAINE 2% (20 MG/ML) 5 ML SYRINGE
INTRAMUSCULAR | Status: DC | PRN
  Administered 2017-01-14: 1.5 mg/kg/h via INTRAVENOUS

## 2017-01-14 MED ORDER — SUGAMMADEX SODIUM 200 MG/2ML IV SOLN
INTRAVENOUS | Status: DC | PRN
Start: 2017-01-14 — End: 2017-01-14
  Administered 2017-01-14: 200 mg via INTRAVENOUS

## 2017-01-14 MED ORDER — KETAMINE HCL 10 MG/ML IJ SOLN
INTRAMUSCULAR | Status: AC
  Filled 2017-01-14: qty 1

## 2017-01-14 MED ORDER — ACETAMINOPHEN 500 MG PO TABS
1000.0000 mg | ORAL_TABLET | Freq: Four times a day (QID) | ORAL | Status: DC
  Administered 2017-01-14 – 2017-01-18 (×15): 1000 mg via ORAL
  Filled 2017-01-14 (×15): qty 2

## 2017-01-14 MED ORDER — BUPIVACAINE LIPOSOME 1.3 % IJ SUSP
20.0000 mL | Freq: Once | INTRAMUSCULAR | Status: DC
  Filled 2017-01-14: qty 20

## 2017-01-14 MED ORDER — PROPOFOL 10 MG/ML IV BOLUS
INTRAVENOUS | Status: AC
  Filled 2017-01-14: qty 20

## 2017-01-14 MED ORDER — CEFOTETAN DISODIUM-DEXTROSE 2-2.08 GM-% IV SOLR
2.0000 g | INTRAVENOUS | Status: AC
  Administered 2017-01-14: 2 g via INTRAVENOUS
  Filled 2017-01-14: qty 50

## 2017-01-14 MED ORDER — MELOXICAM 15 MG PO TABS
15.0000 mg | ORAL_TABLET | Freq: Every day | ORAL | Status: DC
  Administered 2017-01-14 – 2017-01-18 (×5): 15 mg via ORAL
  Filled 2017-01-14 (×5): qty 1

## 2017-01-14 MED ORDER — EPHEDRINE 5 MG/ML INJ
INTRAVENOUS | Status: AC
  Filled 2017-01-14: qty 10

## 2017-01-14 MED ORDER — BUPIVACAINE LIPOSOME 1.3 % IJ SUSP
INTRAMUSCULAR | Status: DC | PRN
Start: 2017-01-14 — End: 2017-01-14
  Administered 2017-01-14: 20 mL

## 2017-01-14 MED ORDER — FENTANYL CITRATE (PF) 250 MCG/5ML IJ SOLN
INTRAMUSCULAR | Status: AC
  Filled 2017-01-14: qty 5

## 2017-01-14 SURGICAL SUPPLY — 101 items
BLADE EXTENDED COATED 6.5IN (ELECTRODE) IMPLANT
CANNULA REDUC XI 12-8 STAPL (CANNULA) ×1
CANNULA REDUCER 12-8 DVNC XI (CANNULA) ×1 IMPLANT
CELLS DAT CNTRL 66122 CELL SVR (MISCELLANEOUS) IMPLANT
CLIP VESOLOCK LG 6/CT PURPLE (CLIP) IMPLANT
CLIP VESOLOCK MED 6/CT (CLIP) IMPLANT
COVER MAYO STAND STRL (DRAPES) IMPLANT
COVER SURGICAL LIGHT HANDLE (MISCELLANEOUS) ×2 IMPLANT
COVER TIP SHEARS 8 DVNC (MISCELLANEOUS) ×1 IMPLANT
COVER TIP SHEARS 8MM DA VINCI (MISCELLANEOUS) ×1
DECANTER SPIKE VIAL GLASS SM (MISCELLANEOUS) IMPLANT
DERMABOND ADVANCED (GAUZE/BANDAGES/DRESSINGS) ×1
DERMABOND ADVANCED .7 DNX12 (GAUZE/BANDAGES/DRESSINGS) ×1 IMPLANT
DRAIN CHANNEL 19F RND (DRAIN) IMPLANT
DRAPE ARM DVNC X/XI (DISPOSABLE) ×4 IMPLANT
DRAPE COLUMN DVNC XI (DISPOSABLE) ×1 IMPLANT
DRAPE DA VINCI XI ARM (DISPOSABLE) ×4
DRAPE DA VINCI XI COLUMN (DISPOSABLE) ×1
DRAPE SURG IRRIG POUCH 19X23 (DRAPES) ×2 IMPLANT
DRSG OPSITE POSTOP 4X10 (GAUZE/BANDAGES/DRESSINGS) IMPLANT
DRSG OPSITE POSTOP 4X6 (GAUZE/BANDAGES/DRESSINGS) ×2 IMPLANT
DRSG OPSITE POSTOP 4X8 (GAUZE/BANDAGES/DRESSINGS) IMPLANT
DRSG TEGADERM 4X4.75 (GAUZE/BANDAGES/DRESSINGS) ×2 IMPLANT
ELECT PENCIL ROCKER SW 15FT (MISCELLANEOUS) ×2 IMPLANT
ELECT REM PT RETURN 15FT ADLT (MISCELLANEOUS) ×2 IMPLANT
ENDOLOOP SUT PDS II  0 18 (SUTURE)
ENDOLOOP SUT PDS II 0 18 (SUTURE) IMPLANT
EVACUATOR SILICONE 100CC (DRAIN) IMPLANT
GAUZE SPONGE 4X4 12PLY STRL (GAUZE/BANDAGES/DRESSINGS) IMPLANT
GLOVE BIO SURGEON STRL SZ 6.5 (GLOVE) ×6 IMPLANT
GLOVE BIOGEL PI IND STRL 7.0 (GLOVE) ×3 IMPLANT
GLOVE BIOGEL PI INDICATOR 7.0 (GLOVE) ×3
GOWN STRL REUS W/TWL 2XL LVL3 (GOWN DISPOSABLE) ×6 IMPLANT
GOWN STRL REUS W/TWL XL LVL3 (GOWN DISPOSABLE) ×8 IMPLANT
GRASPER ENDOPATH ANVIL 10MM (MISCELLANEOUS) IMPLANT
GRASPER SUT TROCAR 14GX15 (MISCELLANEOUS) IMPLANT
HOLDER FOLEY CATH W/STRAP (MISCELLANEOUS) ×2 IMPLANT
IRRIG SUCT STRYKERFLOW 2 WTIP (MISCELLANEOUS) ×2
IRRIGATION SUCT STRKRFLW 2 WTP (MISCELLANEOUS) ×1 IMPLANT
IRRIGATOR SUCT 8 DISP DVNC XI (IRRIGATION / IRRIGATOR) IMPLANT
IRRIGATOR SUCTION 8MM XI DISP (IRRIGATION / IRRIGATOR)
KIT PROCEDURE DA VINCI SI (MISCELLANEOUS)
KIT PROCEDURE DVNC SI (MISCELLANEOUS) IMPLANT
NEEDLE INSUFFLATION 14GA 120MM (NEEDLE) ×2 IMPLANT
PACK CARDIOVASCULAR III (CUSTOM PROCEDURE TRAY) ×2 IMPLANT
PACK COLON (CUSTOM PROCEDURE TRAY) ×2 IMPLANT
PORT LAP GEL ALEXIS MED 5-9CM (MISCELLANEOUS) ×2 IMPLANT
RTRCTR WOUND ALEXIS 18CM MED (MISCELLANEOUS)
SCISSORS LAP 5X35 DISP (ENDOMECHANICALS) ×2 IMPLANT
SEAL CANN UNIV 5-8 DVNC XI (MISCELLANEOUS) ×3 IMPLANT
SEAL XI 5MM-8MM UNIVERSAL (MISCELLANEOUS) ×3
SEALER VESSEL DA VINCI XI (MISCELLANEOUS) ×1
SEALER VESSEL EXT DVNC XI (MISCELLANEOUS) ×1 IMPLANT
SLEEVE ADV FIXATION 5X100MM (TROCAR) IMPLANT
SOLUTION ELECTROLUBE (MISCELLANEOUS) ×2 IMPLANT
SPONGE DRAIN TRACH 4X4 STRL 2S (GAUZE/BANDAGES/DRESSINGS) ×2 IMPLANT
SPONGE LAP 18X18 X RAY DECT (DISPOSABLE) ×2 IMPLANT
STAPLER 45 BLU RELOAD XI (STAPLE) IMPLANT
STAPLER 45 BLUE RELOAD XI (STAPLE)
STAPLER 45 GREEN RELOAD XI (STAPLE) ×2
STAPLER 45 GRN RELOAD XI (STAPLE) ×2 IMPLANT
STAPLER CANNULA SEAL DVNC XI (STAPLE) ×1 IMPLANT
STAPLER CANNULA SEAL XI (STAPLE) ×1
STAPLER CIRC ILS CVD 33MM 37CM (STAPLE) ×2 IMPLANT
STAPLER SHEATH (SHEATH) ×1
STAPLER SHEATH ENDOWRIST DVNC (SHEATH) ×1 IMPLANT
STAPLER VISISTAT 35W (STAPLE) IMPLANT
SUT ETHILON 2 0 PS N (SUTURE) ×2 IMPLANT
SUT NOVA 1 T20/GS 25DT (SUTURE) ×4 IMPLANT
SUT NOVA NAB GS-21 0 18 T12 DT (SUTURE) IMPLANT
SUT NOVA NAB GS-21 1 T12 (SUTURE) IMPLANT
SUT PDS AB 1 CTX 36 (SUTURE) IMPLANT
SUT PDS AB 1 TP1 96 (SUTURE) IMPLANT
SUT PROLENE 2 0 KS (SUTURE) ×2 IMPLANT
SUT SILK 2 0 (SUTURE) ×1
SUT SILK 2 0 SH CR/8 (SUTURE) ×2 IMPLANT
SUT SILK 2-0 18XBRD TIE 12 (SUTURE) ×1 IMPLANT
SUT SILK 3 0 (SUTURE) ×1
SUT SILK 3 0 SH CR/8 (SUTURE) ×2 IMPLANT
SUT SILK 3-0 18XBRD TIE 12 (SUTURE) ×1 IMPLANT
SUT V-LOC BARB 180 2/0GR6 GS22 (SUTURE)
SUT VIC AB 2-0 SH 18 (SUTURE) ×2 IMPLANT
SUT VIC AB 2-0 SH 27 (SUTURE) ×1
SUT VIC AB 2-0 SH 27X BRD (SUTURE) ×1 IMPLANT
SUT VIC AB 3-0 SH 18 (SUTURE) ×2 IMPLANT
SUT VIC AB 4-0 PS2 18 (SUTURE) ×4 IMPLANT
SUT VIC AB 4-0 PS2 27 (SUTURE) ×4 IMPLANT
SUT VICRYL 0 UR6 27IN ABS (SUTURE) ×2 IMPLANT
SUTURE V-LC BRB 180 2/0GR6GS22 (SUTURE) IMPLANT
SYR 10ML ECCENTRIC (SYRINGE) ×2 IMPLANT
SYS LAPSCP GELPORT 120MM (MISCELLANEOUS)
SYSTEM LAPSCP GELPORT 120MM (MISCELLANEOUS) IMPLANT
TOWEL OR 17X26 10 PK STRL BLUE (TOWEL DISPOSABLE) ×2 IMPLANT
TOWEL OR NON WOVEN STRL DISP B (DISPOSABLE) ×2 IMPLANT
TRAY FOLEY W/METER SILVER 16FR (SET/KITS/TRAYS/PACK) ×2 IMPLANT
TRAY IRRIG W/60CC SYR STRL (SET/KITS/TRAYS/PACK) ×4 IMPLANT
TROCAR ADV FIXATION 5X100MM (TROCAR) ×2 IMPLANT
TUBING CONNECTING 10 (TUBING) ×4 IMPLANT
TUBING INSUFFLATION 10FT LAP (TUBING) ×2 IMPLANT
TUBING TUR DISP (UROLOGICAL SUPPLIES) ×2 IMPLANT
URINEMETER 200ML W/220 (MISCELLANEOUS) ×2 IMPLANT

## 2017-01-14 NOTE — Anesthesia Postprocedure Evaluation (Signed)
Anesthesia Post Note  Patient: Billy Horn  Procedure(s) Performed: Procedure(s) (LRB): XI ROBOT ASSISTED LAPAROSCOPIC PARTIAL COLECTOMY ERAS PATHWAY (N/A)     Patient location during evaluation: PACU Anesthesia Type: General Level of consciousness: awake and alert Pain management: pain level controlled Vital Signs Assessment: post-procedure vital signs reviewed and stable Respiratory status: spontaneous breathing, nonlabored ventilation, respiratory function stable and patient connected to nasal cannula oxygen Cardiovascular status: blood pressure returned to baseline and stable Postop Assessment: no apparent nausea or vomiting Anesthetic complications: no    Last Vitals:  Vitals:   01/14/17 1245 01/14/17 1300  BP: (!) 158/83 (!) 162/86  Pulse: 66 66  Resp: 13 11  Temp:  (!) 36.4 C  SpO2: 100% 100%    Last Pain:  Vitals:   01/14/17 1300  TempSrc:   PainSc: [place]

## 2017-01-14 NOTE — H&P (Signed)
The patient is a 71 year old male who presents with diverticulitis. 71 year old male who was hospitalized in June 2017 with diverticulitis and a liver abscess. He was placed on IV antibiotics and a percutaneous drain was placed in his liver. He was followed by infectious disease after his hospitalization. He is now cleared his liver infection and is working on recovery. He underwent a colonoscopy by Dr. Oletta Lamas last month which was normal. He is here today to discuss surgical resection to prevent future recurrences. He denies any abdominal pain and having regular bowel movements with the help of MiraLAX as he is on an iron supplement. He is getting his energy level back. He is gaining weight. His hemoglobin levels are increasing. He has a history of robotic prostate surgery with Dr. Alinda Money.   Past Surgical History Malachy Moan, [place]; 12/01/2016 11:21 AM) No pertinent past surgical history  Diagnostic Studies History Malachy Moan, RMA; 12/01/2016 11:21 AM) Colonoscopy within last year  Allergies Malachy Moan, RMA; 12/01/2016 11:22 AM) No Known Allergies 12/01/2016  Medication History Malachy Moan, RMA; 12/01/2016 11:25 AM) Meloxicam (15MG  Tablet, Oral) Active. Acetaminophen (325MG  Tablet, Oral) Active. Aspirin (81MG  Tablet Chewable, Oral) Active. Iron (325 (65 Fe)MG Tablet, Oral) Active. Colace (100MG  Capsule, Oral) Active. Metamucil (0.52GM Capsule, Oral) Active. Calcium Carbonate-Iron (Oral) Specific strength unknown - Active. Multivitamin Adults (Oral) Active. Medications Reconciled  Social History Malachy Moan, [place]; 12/01/2016 11:21 AM) Alcohol use Occasional alcohol use. Caffeine use Carbonated beverages, Coffee, Tea. No drug use Tobacco use Never smoker.  Family History Malachy Moan, [place]; 12/01/2016 11:21 AM) Alcohol Abuse Father. Cancer Mother. Respiratory Condition Mother.  Other Problems Malachy Moan, RMA;  12/01/2016 11:21 AM) Diabetes Mellitus Diverticulosis Prostate Cancer     Review of Systems  General Not Present- Appetite Loss, Chills, Fatigue, Fever, Night Sweats, Weight Gain and Weight Loss. Skin Not Present- Change in Wart/Mole, Dryness, Hives, Jaundice, New Lesions, Non-Healing Wounds, Rash and Ulcer. HEENT Present- Hearing Loss and Wears glasses/contact lenses. Not Present- Earache, Hoarseness, Nose Bleed, Oral Ulcers, Ringing in the Ears, Seasonal Allergies, Sinus Pain, Sore Throat, Visual Disturbances and Yellow Eyes. Respiratory Not Present- Bloody sputum, Chronic Cough, Difficulty Breathing, Snoring and Wheezing. Breast Not Present- Breast Mass, Breast Pain, Nipple Discharge and Skin Changes. Cardiovascular Not Present- Chest Pain, Difficulty Breathing Lying Down, Leg Cramps, Palpitations, Rapid Heart Rate, Shortness of Breath and Swelling of Extremities. Gastrointestinal Not Present- Abdominal Pain, Bloating, Bloody Stool, Change in Bowel Habits, Chronic diarrhea, Constipation, Difficulty Swallowing, Excessive gas, Gets full quickly at meals, Hemorrhoids, Indigestion, Nausea, Rectal Pain and Vomiting. Male Genitourinary Present- Nocturia. Not Present- Blood in Urine, Change in Urinary Stream, Frequency, Impotence, Painful Urination, Urgency and Urine Leakage.  BP (!) 154/86   Pulse 72   Temp 97.8 F (36.6 C) (Oral)   Resp 18   Ht 6' (1.829 m)   Wt 80.3 kg (177 lb)   SpO2 99%   BMI 24.01 kg/m     Physical Exam   General Mental Status-Alert. General Appearance-Not in acute distress. Build & Nutrition-Well nourished. Posture-Normal posture. Gait-Normal.  Head and Neck Head-normocephalic, atraumatic with no lesions or palpable masses. Trachea-midline.  Chest and Lung Exam Chest and lung exam reveals -on auscultation, normal breath sounds, no adventitious sounds and normal vocal resonance.  Cardiovascular Cardiovascular examination  reveals -normal heart sounds, regular rate and rhythm with no murmurs and no digital clubbing, cyanosis, edema, increased warmth or tenderness.  Abdomen Inspection Inspection of the abdomen reveals - No Hernias. Palpation/Percussion Palpation and  Percussion of the abdomen reveal - Soft, Non Tender, No Rigidity (guarding), No hepatosplenomegaly and No Palpable abdominal masses.  Neurologic Neurologic evaluation reveals -alert and oriented x 3 with no impairment of recent or remote memory, normal attention span and ability to concentrate, normal sensation and normal coordination.  Musculoskeletal Normal Exam - Bilateral-Upper Extremity Strength Normal and Lower Extremity Strength Normal.    Assessment & Plan   DIVERTICULAR DISEASE OF COLON (K57.30) Impression: 71 year old male who presented to [facility] with fevers and abdominal pain. He was found to have a left-sided liver abscess. He underwent percutaneous drainage and continued on IV antibiotics for several weeks. He is now recovering from this. He is starting to regain his weight and energy levels. The patient had diverticulitis seen on CT scan which was felt to be the cause of his liver abscess. He is here today to discuss resection of the sigmoid colon to prevent future recurrences. The surgery and anatomy were described to the patient as well as the risks of surgery and the possible complications. These include: Bleeding, deep abdominal infections and possible wound complications such as hernia and infection, damage to adjacent structures, leak of surgical connections, which can lead to other surgeries and possibly an ostomy, possible need for other procedures, such as abscess drains in radiology, possible prolonged hospital stay, possible diarrhea from removal of part of the colon, possible constipation from narcotics, possible bowel, bladder or sexual dysfunction if having rectal surgery, prolonged fatigue/weakness  or appetite loss, possible early recurrence of of disease, possible complications of their medical problems such as heart disease or arrhythmias or lung problems, death (less than 1%). I believe the patient understands and wishes to proceed with the surgery.

## 2017-01-14 NOTE — Consult Note (Signed)
H&P Physician requesting consult: Leighton Ruff, MD  Chief Complaint: concern for bladder or urethral injury  History of Present Illness: I was consulted intraoperatively on this patient with a history of diverticular disease. He also has a history of prostate cancer status post robotic-assisted laparoscopic prostatectomy with Dr. Alinda Money. during the case there was concern for issues with the Foley catheter and the possibility that it could be malpositioned as well as the possibility that injury of mandible occurred to the urethra. Nursing deflated the balloon before I came in and we advanced the catheter and reinflated the balloon. Therefore I was asked to assist the catheter. I was also asked to inspect the bladder in the area were the large abscess between the colon and bladder have been peeled off from the bladder. All history was obtained by chart review.  Past Medical History:  Diagnosis Date  . Anemia   . Cancer [facility]) 2012   prostate cancer  . Diabetes mellitus    controlled with diet and exercise  . Liver abscess   . Pneumonia    as a child   Past Surgical History:  Procedure Laterality Date  . COLONOSCOPY WITH PROPOFOL N/A 11/09/2016   Procedure: COLONOSCOPY WITH PROPOFOL;  Surgeon: Laurence Spates, MD;  Location: [place];  Service: Endoscopy;  Laterality: N/A;  . ORIF TIBIA PLATEAU Left 07/23/2012   Procedure: OPEN REDUCTION INTERNAL FIXATION (ORIF) TIBIAL PLATEAU;  Surgeon: Sharmon Revere, MD;  Location: [facility];  Service: Orthopedics;  Laterality: Left;  . ROBOT ASSISTED LAPAROSCOPIC RADICAL PROSTATECTOMY  04/06/2011   Procedure: ROBOTIC ASSISTED LAPAROSCOPIC RADICAL PROSTATECTOMY LEVEL 2;  Surgeon: Dutch Gray, MD;  Location: [facility];  Service: Urology;  Laterality: N/A;  Bilateral Pelvic Lymphadenectomy   . TONSILLECTOMY     as a child    Home Medications:  Prescriptions Prior to Admission  Medication Sig Dispense Refill Last Dose  . aspirin EC 81 MG tablet Take 81 mg by  mouth daily after breakfast.   01/09/2017  . Calcium Carbonate (CALCIUM-CARB 600 PO) Take 1 tablet by mouth daily after breakfast.   01/12/2017  . ferrous sulfate 325 (65 FE) MG tablet Take 1 tablet (325 mg total) by mouth 2 (two) times daily with a meal. For anemia 60 tablet 0 01/12/2017  . meloxicam (MOBIC) 15 MG tablet Take 15 mg by mouth daily.   01/12/2017  . Multiple Vitamin (MULTIVITAMIN WITH MINERALS) TABS Take 1 tablet by mouth daily after breakfast.    01/12/2017  . polyethylene glycol (MIRALAX / GLYCOLAX) packet Take 17 g by mouth daily.   01/13/2017 at Unknown time  . psyllium (METAMUCIL) 0.52 G capsule Take 1.04 g by mouth daily after breakfast.    01/09/2017  . acetaminophen (TYLENOL) 325 MG tablet Take 1-2 tablets (325-650 mg total) by mouth every 4 (four) hours as needed. (Patient not taking: Reported on 12/30/2016)   Not Taking at Unknown time  . docusate sodium (COLACE) 100 MG capsule Take 1 capsule (100 mg total) by mouth 2 (two) times daily. 60 capsule 2 Not Taking at Unknown time   Allergies: No Known Allergies  Family History  Problem Relation Age of Onset  . Lung cancer Mother 63   Social History:  reports that he has never smoked. He has never used smokeless tobacco. He reports that he drinks alcohol. He reports that he does not use drugs.  ROS: Unable to be performed since he was intubated and sedated ROS   Physical Exam:  Vital signs  in last 24 hours: General:  The patient is intubated and sedated. The majority of his body is prepped and draped. GU: I inspected the Foley catheter. It was in proper placement. I instilled close to 300 mL of normal saline into the bladder. I then closely inspected the bladder from the robotic console.there was a defect in the area of the posterior bladder approximately 3 cm. However the bladder appeared to be fully intact. There is no evidence of any leak after instillation of normal saline. The bladder held the normal saline well. I did not  see any obvious evidence of ureteral injury.   Laboratory Data:  Results for orders placed or performed during the hospital encounter of 01/14/17 (from the past 24 hour(s))  Glucose, capillary     Status: Abnormal   Collection Time: 01/14/17  5:30 AM  Result Value Ref Range   Glucose-Capillary 139 (H) 65 - 99 mg/dL   Comment 1 Notify RN   Glucose, capillary     Status: Abnormal   Collection Time: 01/14/17 11:42 AM  Result Value Ref Range   Glucose-Capillary 195 (H) 65 - 99 mg/dL   No results found for this or any previous visit (from the past 240 hour(s)). Creatinine: No results for input(s): CREATININE in the last 168 hours.  Impression/Assessment:  Concern for malpositioned Foley catheter and possible urethral or bladder injury  Plan:  Foley catheter is in proper place and there is no evidence of any significant urethral or bladder injury. Agree with placing a drain at the end of the case. Recommend continuing the Foley catheter for at least 3-4 days in the event that there was minor trauma in the area of the anastomosis and this will allow the area to heal.  Marton Redwood, III 01/14/2017, 7:00 PM

## 2017-01-14 NOTE — Transfer of Care (Signed)
Immediate Anesthesia Transfer of Care Note  Patient: Billy Horn  Procedure(s) Performed: Procedure(s): XI ROBOT ASSISTED LAPAROSCOPIC PARTIAL COLECTOMY ERAS PATHWAY (N/A)  Patient Location: PACU  Anesthesia Type:General  Level of Consciousness:  sedated, patient cooperative and responds to stimulation  Airway & Oxygen Therapy:Patient Spontanous Breathing and Patient connected to face mask oxgen  Post-op Assessment:  Report given to PACU RN and Post -op Vital signs reviewed and stable  Post vital signs:  Reviewed and stable  Last Vitals:  Vitals:   01/14/17 0531  BP: (!) 154/86  Pulse: 72  Resp: 18  Temp: 36.6 C  SpO2: 07%    Complications: No apparent anesthesia complications

## 2017-01-14 NOTE — Anesthesia Procedure Notes (Signed)
Procedure Name: Intubation Date/Time: 01/14/2017 7:33 AM Performed by: Talbot Grumbling Pre-anesthesia Checklist: Patient identified, Emergency Drugs available, Suction available and Patient being monitored Patient Re-evaluated:Patient Re-evaluated prior to induction Oxygen Delivery Method: Circle system utilized Preoxygenation: Pre-oxygenation with 100% oxygen Induction Type: IV induction Ventilation: Mask ventilation without difficulty Laryngoscope Size: Mac and 3 Grade View: Grade II Tube type: Oral Tube size: 8.0 mm Number of attempts: 1 Airway Equipment and Method: Stylet Placement Confirmation: ETT inserted through vocal cords under direct vision,  positive ETCO2 and breath sounds checked- equal and bilateral Secured at: 23 cm Tube secured with: Tape Dental Injury: Teeth and Oropharynx as per pre-operative assessment

## 2017-01-14 NOTE — Progress Notes (Addendum)
Paged MD on call, Dr. Lucia Gaskins, re: elevated Bps. Will await reply and continue to monitor pt.  Spoke w/Dr. Lucia Gaskins. Reviewed above and pt history. He states to try to control BP w/pain meds for now and notify him if diastolic >660. Wants Dr. Marcello Moores to address hx of hypertension and possible meds tomorrow morning. Will continue to monitor.

## 2017-01-14 NOTE — Op Note (Signed)
01/14/2017  12:00 PM  PATIENT:  Billy Horn  71 y.o. male  Patient Care Team: Alroy Dust, L.Marlou Sa, MD as PCP - General (Family Medicine)  PRE-OPERATIVE DIAGNOSIS:  diverticular disease  POST-OPERATIVE DIAGNOSIS:  DIVERTICULAR DISEASE   PROCEDURE:   XI ROBOT ASSISTED LAPAROSCOPIC PARTIAL COLECTOMY  Surgeon(s): Leighton Ruff, MD Ileana Roup, MD Lucas Mallow, MD  ASSISTANT: Dr Dema Severin   ANESTHESIA:   local and general  EBL: 239ml Total I/O In: 2100 [I.V.:2100] Out: 400 [Urine:200; Blood:200]  Delay start of Pharmacological VTE agent (>24hrs) due to surgical blood loss or risk of bleeding:  no  DRAINS: (4F ) Jackson-Pratt drain(s) with closed bulb suction in the pelvis   SPECIMEN:  Source of Specimen:  Sigmoid colon  DISPOSITION OF SPECIMEN:  PATHOLOGY  COUNTS:  YES  PLAN OF CARE: Admit to inpatient   PATIENT DISPOSITION:  PACU - hemodynamically stable.  INDICATION:    71 y.o. M with h/o diverticulitis and liver abscess.   I recommended segmental resection:  The anatomy & physiology of the digestive tract was discussed.  The pathophysiology was discussed.  Natural history risks without surgery was discussed.   I worked to give an overview of the disease and the frequent need to have multispecialty involvement.  I feel the risks of no intervention will lead to serious problems that outweigh the operative risks; therefore, I recommended a partial colectomy to remove the pathology.  Laparoscopic & open techniques were discussed.   Risks such as bleeding, infection, abscess, leak, reoperation, possible ostomy, hernia, heart attack, death, and other risks were discussed.  I noted a good likelihood this will help address the problem.   Goals of post-operative recovery were discussed as well.    The patient expressed understanding & wished to proceed with surgery.  OR FINDINGS:   Patient had ~3 cm abscess between colon and bladder.  Post surgical changes from  previous prostatectomy.    DESCRIPTION:   Informed consent was confirmed.  The patient underwent general anaesthesia without difficulty.  The patient was positioned appropriately.  VTE prevention in place.  The patient's abdomen was clipped, prepped, & draped in a sterile fashion.  Surgical timeout confirmed our plan.  The patient was positioned in reverse Trendelenburg.  Abdominal entry was gained using a Varies needle in the LUQ.  Entry was clean.  I induced carbon dioxide insufflation.  An 23mm robotic port was placed in the RUQ.  Camera inspection revealed no injury.  Extra ports were carefully placed under direct laparoscopic visualization.  I laparoscopically reflected the greater omentum and the upper abdomen the small bowel in the upper abdomen. The patient was appropriately positioned and the robot was docked to the patient's left side.  Instruments were placed under direct visualization.    I mobilized the sigmoid colon off of the pelvic sidewall.  I then scored the base of peritoneum of the right side of the mesentery of the left colon from the ligament of Treitz to the peritoneal reflection of the mid rectum.  The patient had obvious post surgical changes from his robotic prostate surgery.  The diseased portion of colon was adherent to his bladder.  I elevated the sigmoid mesentery and enetered into the retro-mesenteric plane. We were able to identify the left ureter and gonadal vessels. We kept those posterior within the retroperitoneum and elevated the left colon mesentery off that. I did isolated IMA pedicle but did not ligate it yet.  I continued distally and got  into the avascular plane posterior to the mesorectum. This allowed me to help mobilize the rectum as well by freeing the mesorectum off the sacrum.  I mobilized the peritoneal coverings towards the peritoneal reflection on both the right and left sides of the rectum.  I could see the right and left ureters and stayed away from them.  I mobilized the bladder anteriorly to delineate his pelvic anatomy.  I slid the bladder was no emptying into the Foley. I had the nurse remove the balloon and advanced the Foley. This allowed for the bladder to empty appropriately. I continued to bluntly mobilize the colon off of the bladder until we entered into an abscess cavity. Debris was removed from the abdomen. I mobilized along the right side and identified the right ureter attached to the bladder. This was preserved.  I called the urologist on call to evaluate the urethra.  He examined the patient and did not find anything significantly injured.  He recommended leaving the foley in place for 1 week.    I skeletonized the inferior mesenteric artery pedicle.  I went down to its takeoff from the aorta.  After confirming the left ureter was out of the way, I went ahead and ligated the inferior mesenteric artery pedicle with bipolar robotic vessel sealer well above its takeoff from the aorta.   I did not ligate the inferior mesenteric vein in a similar fashion.  We ensured hemostasis.  I skeletonized the mesorectum at the junction at the proximal rectum using blunt dissection & bipolar robotic vessel sealer.  I mobilized the left colon in a lateral to medial fashion off the line of Toldt up towards the splenic flexure to ensure good mobilization of the left colon to reach into the pelvis.  I skeletonized the mesorectum at the rectosigmoid junction. This was then divided using 2 green load robotic staplers.  The remaining mesentery of the colon was divided using the robotic vessel sealer.  I evaluated the abdomen and hemostasis was good. The robot was then undocked. The abdomen was desufflated. I made a vertical incision in the previous scar lateral to midline.  The abdomen was opened and [place] wound protector was placed. The colon was brought out through the wound and transected over a pursestring device using the cautery. Specimen was sent to pathology for  further examination. A 2-0 Prolene pursestring was placed. An additional 3-0 silk suture was placed around the 33 mm EEA anvil to bring up a diverticuli out of the anastomosis. This was then placed back into the abdomen. The abdomen was insufflated and an anastomosis was created under laparoscopic visualization. There was no tension. There was no leak when tested with insufflation under water. Due to the contamination from the abscess cavity, a 19 [place] Blake drain was placed anterior to the rectum around the cavity.  This was brought out through the right lower quadrant port site and secured into place with 2-0 nylon suture. The 12 mm port site in the right lower quadrant was then closed using a 0 Vicryl suture and a suture passer laparoscopically. We then switched to clean gowns, gloves, instruments and drapes.  The fascia was then closed using interrupted #1 Novafil sutures. The subcutaneous layer was closed using 2-0 Vicryl suture and the skin was closed using a 4-0 Vicryl suture. The port sites were also closed using 4-0 Vicryl suture. The patient was awakened from anesthesia and sent to the postanesthesia care unit in stable condition. All counts were correct per  operating room staff.  An MD assistant was necessary for tissue manipulation, retraction and positioning due to the complexity of the case and hospital policies

## 2017-01-15 LAB — BASIC METABOLIC PANEL
Anion gap: 6 (ref 5–15)
BUN: 15 mg/dL (ref 6–20)
CO2: 27 mmol/L (ref 22–32)
CREATININE: 1.36 mg/dL — AB (ref 0.61–1.24)
Calcium: 8.6 mg/dL — ABNORMAL LOW (ref 8.9–10.3)
Chloride: 103 mmol/L (ref 101–111)
GFR calc Af Amer: 58 mL/min — ABNORMAL LOW (ref >60)
GFR, EST NON AFRICAN AMERICAN: 50 mL/min — AB (ref >60)
Glucose, Bld: 207 mg/dL — ABNORMAL HIGH (ref 65–99)
POTASSIUM: 4.3 mmol/L (ref 3.5–5.1)
Sodium: 136 mmol/L (ref 135–145)

## 2017-01-15 LAB — CBC
HEMATOCRIT: 31.8 % — AB (ref 39.0–52.0)
Hemoglobin: 10.8 g/dL — ABNORMAL LOW (ref 13.0–17.0)
MCH: 29.3 pg (ref 26.0–34.0)
MCHC: 34 g/dL (ref 30.0–36.0)
MCV: 86.4 fL (ref 78.0–100.0)
PLATELETS: 179 10*3/uL (ref 150–400)
RBC: 3.68 MIL/uL — ABNORMAL LOW (ref 4.22–5.81)
RDW: 13.5 % (ref 11.5–15.5)
WBC: 12.1 10*3/uL — AB (ref 4.0–10.5)

## 2017-01-15 MED ORDER — OXYCODONE HCL 5 MG PO TABS: 5.0000 mg | ORAL_TABLET | Freq: Four times a day (QID) | ORAL | Status: DC | PRN

## 2017-01-15 NOTE — Progress Notes (Signed)
1 Day Post-Op robotic sigmoidectomy  Subjective: No nausea, tolerating clears, no flatus  Objective: Vital signs in last 24 hours: Temp:  [97.5 F (36.4 C)-98.7 F (37.1 C)] 97.7 F (36.5 C) (09/28 0525) Pulse Rate:  [61-83] 61 (09/28 0525) Resp:  [11-20] 18 (09/28 0525) BP: (128-184)/(77-93) 184/84 (09/28 0525) SpO2:  [97 %-100 %] 100 % (09/28 0525)   Intake/Output from previous day: 09/27 0701 - 09/28 0700 In: 3928.8 [P.O.:540; I.V.:3388.8] Out: 2195 [Urine:1900; Drains:95; Blood:200] Intake/Output this shift: No intake/output data recorded.   General appearance: alert and cooperative GI: normal findings: soft, non-distended JP: SS fluid Incision: no significant drainage  Lab Results:   Recent Labs  01/15/17 0524  WBC 12.1*  HGB 10.8*  HCT 31.8*  PLT 179   BMET  Recent Labs  01/15/17 0524  NA 136  K 4.3  CL 103  CO2 27  GLUCOSE 207*  BUN 15  CREATININE 1.36*  CALCIUM 8.6*   PT/INR No results for input(s): LABPROT, INR in the last 72 hours. ABG No results for input(s): PHART, HCO3 in the last 72 hours.  Invalid input(s): PCO2, PO2  MEDS, Scheduled . acetaminophen  1,000 mg Oral Q6H  . alvimopan  12 mg Oral BID  . enoxaparin (LOVENOX) injection  40 mg Subcutaneous Q24H  . gabapentin  300 mg Oral BID  . meloxicam  15 mg Oral Daily    Studies/Results: No results found.  Assessment: s/p Procedure(s): XI ROBOT ASSISTED LAPAROSCOPIC PARTIAL COLECTOMY ERAS PATHWAY Patient Active Problem List   Diagnosis Date Noted  . Diverticular disease 01/14/2017  . Colonic diverticular abscess 10/27/2016  . Sigmoid diverticulitis 10/01/2016  . Liver abscess 09/28/2016  . Anemia 09/28/2016  . Diabetes mellitus type 2, uncontrolled ([place]) 07/27/2012  . Narcotic induced constipation 07/27/2012  . Tibial plateau fracture 07/27/2012    Expected post op course  Plan: Continue foley due to per urology x 1 wk HTN most likely due to pain and fluid  retention Cont IVF and recheck Cr in AM Increase to FLD as tolerated Ambulate Cont JP    LOS: 1 day     .Rosario Adie, [facility], [place] [phone]   01/15/2017 7:51 AM

## 2017-01-15 NOTE — Progress Notes (Signed)
Urology Inpatient Progress Report  diverticular disease  Procedure(s): XI ROBOT ASSISTED LAPAROSCOPIC PARTIAL COLECTOMY ERAS PATHWAY  1 Day Post-Op   Intv/Subj: No acute events overnight. Patient is without complaint. Foley catheter has been draining well  Active Problems:   Diverticular disease  Current Facility-Administered Medications  Medication Dose Route Frequency Provider Last Rate Last Dose  . acetaminophen (TYLENOL) tablet 1,000 mg  1,000 mg Oral V0J Leighton Ruff, MD   5,009 mg at 01/15/17 1414  . alum & mag hydroxide-simeth (MAALOX/MYLANTA) 200-200-20 MG/5ML suspension 30 mL  30 mL Oral F8H PRN Leighton Ruff, MD   30 mL at 01/14/17 1550  . alvimopan (ENTEREG) capsule 12 mg  12 mg Oral BID Leighton Ruff, MD   12 mg at 01/15/17 1114  . dextrose 5 % and 0.45 % NaCl with KCl 20 mEq/L infusion   Intravenous Continuous Leighton Ruff, MD 75 mL/hr at 01/15/17 0355    . diphenhydrAMINE (BENADRYL) 12.5 MG/5ML elixir 12.5 mg  12.5 mg Oral W2X PRN Leighton Ruff, MD       Or  . diphenhydrAMINE (BENADRYL) injection 12.5 mg  12.5 mg Intravenous H3Z PRN Leighton Ruff, MD      . enoxaparin (LOVENOX) injection 40 mg  40 mg Subcutaneous J69C Leighton Ruff, MD   40 mg at 01/15/17 0739  . gabapentin (NEURONTIN) capsule 300 mg  300 mg Oral BID Leighton Ruff, MD   789 mg at 01/15/17 1114  . meloxicam (MOBIC) tablet 15 mg  15 mg Oral Daily Leighton Ruff, MD   15 mg at 01/15/17 1114  . morphine 2 MG/ML injection 2-4 mg  2-4 mg Intravenous F8B PRN Leighton Ruff, MD   4 mg at 01/75/10 1950  . ondansetron (ZOFRAN) tablet 4 mg  4 mg Oral C5E PRN Leighton Ruff, MD       Or  . ondansetron [facility]) injection 4 mg  4 mg Intravenous N2D PRN Leighton Ruff, MD      . oxyCODONE (Oxy IR/ROXICODONE) immediate release tablet 5-10 mg  5-10 mg Oral P8E PRN Leighton Ruff, MD         Objective: Vital: Vitals:   01/15/17 0146 01/15/17 0525 01/15/17 1009 01/15/17 1451  BP: 128/77 (!) 184/84 (!)  166/80 (!) 164/82  Pulse: 66 61 70 81  Resp: 16 18 18 18   Temp: 98 F (36.7 C) 97.7 F (36.5 C) 98.2 F (36.8 C) 99.7 F (37.6 C)  TempSrc: Oral Oral Oral Oral  SpO2: 100% 100% 99% 99%  Weight:      Height:       I/Os: I/O last 3 completed shifts: In: 3928.8 [P.O.:540; I.V.:3388.8] Out: 2195 [Urine:1900; Drains:95; Blood:200]  Physical Exam:  General: Patient is in no apparent distress Lungs: Normal respiratory effort, chest expands symmetrically. GI: Incisions are c/d/i. The abdomen is soft and appropriately tender without mass. JP drain with serosanguinous drainage Foley: draining clear yellow urine  Ext: lower extremities symmetric  Lab Results:  Recent Labs  01/15/17 0524  WBC 12.1*  HGB 10.8*  HCT 31.8*    Recent Labs  01/15/17 0524  NA 136  K 4.3  CL 103  CO2 27  GLUCOSE 207*  BUN 15  CREATININE 1.36*  CALCIUM 8.6*   No results for input(s): LABPT, INR in the last 72 hours. No results for input(s): LABURIN in the last 72 hours. Results for orders placed or performed during the hospital encounter of 09/27/16  Blood Culture (routine x 2)     Status:  None   Collection Time: 09/27/16  9:05 PM  Result Value Ref Range Status   Specimen Description BLOOD BLOOD RIGHT FOREARM  Final   Special Requests   Final    BOTTLES DRAWN AEROBIC AND ANAEROBIC Blood Culture adequate volume   Culture   Final    NO GROWTH 5 DAYS Performed at [facility], [address] [place], [place] [address]    Report Status 10/03/2016 FINAL  Final  Blood Culture (routine x 2)     Status: None   Collection Time: 09/27/16  9:35 PM  Result Value Ref Range Status   Specimen Description BLOOD RIGHT ANTECUBITAL  Final   Special Requests   Final    BOTTLES DRAWN AEROBIC AND ANAEROBIC Blood Culture adequate volume   Culture   Final    NO GROWTH 5 DAYS Performed at [facility], [place] [address] [place], [place] [address]    Report Status 10/03/2016 FINAL  Final   Culture, Urine     Status: Abnormal   Collection Time: 09/27/16 10:09 PM  Result Value Ref Range Status   Specimen Description URINE, CLEAN CATCH  Final   Special Requests NONE  Final   Culture MULTIPLE SPECIES PRESENT, SUGGEST RECOLLECTION (A)  Final   Report Status 09/29/2016 FINAL  Final  Aerobic/Anaerobic Culture (surgical/deep wound)     Status: None   Collection Time: 09/29/16  1:10 PM  Result Value Ref Range Status   Specimen Description ABSCESS HEPATIC DRAIN  Final   Special Requests Normal  Final   Gram Stain   Final    ABUNDANT WBC PRESENT,BOTH PMN AND MONONUCLEAR ABUNDANT GRAM POSITIVE COCCI IN CHAINS FEW GRAM VARIABLE ROD    Culture   Final    MODERATE ESCHERICHIA COLI ABUNDANT VIRIDANS STREPTOCOCCUS NO ANAEROBES ISOLATED Performed at [facility], [place] [address] [place], [place] [address]    Report Status 10/04/2016 FINAL  Final   Organism ID, Bacteria ESCHERICHIA COLI  Final   Organism ID, Bacteria VIRIDANS STREPTOCOCCUS  Final      Susceptibility   Escherichia coli - MIC*    AMPICILLIN >=32 RESISTANT Resistant     CEFAZOLIN 8 SENSITIVE Sensitive     CEFEPIME <=1 SENSITIVE Sensitive     CEFTAZIDIME <=1 SENSITIVE Sensitive     CEFTRIAXONE <=1 SENSITIVE Sensitive     CIPROFLOXACIN <=0.25 SENSITIVE Sensitive     GENTAMICIN <=1 SENSITIVE Sensitive     IMIPENEM <=0.25 SENSITIVE Sensitive     TRIMETH/SULFA <=20 SENSITIVE Sensitive     AMPICILLIN/SULBACTAM >=32 RESISTANT Resistant     PIP/TAZO <=4 SENSITIVE Sensitive     Extended ESBL NEGATIVE Sensitive     * MODERATE ESCHERICHIA COLI   Viridans streptococcus - MIC*    PENICILLIN <=0.06 SENSITIVE Sensitive     CEFTRIAXONE 0.25 SENSITIVE Sensitive     ERYTHROMYCIN <=0.12 SENSITIVE Sensitive     LEVOFLOXACIN 0.5 SENSITIVE Sensitive     VANCOMYCIN 0.5 SENSITIVE Sensitive     * ABUNDANT VIRIDANS STREPTOCOCCUS    Studies/Results: No results found.  Assessment: Procedure(s): XI ROBOT ASSISTED  LAPAROSCOPIC PARTIAL COLECTOMY ERAS PATHWAY, 1 Day Post-Op  doing well from a urological standpoint.  Plan: The patient is not having hematuria. I think there is a low likelihood That he had a significant trauma to the urethra. okay to remove the catheter after postop day 3.    Link Snuffer, MD Urology 01/15/2017, 4:20 PM

## 2017-01-16 LAB — BASIC METABOLIC PANEL
ANION GAP: 7 (ref 5–15)
BUN: 11 mg/dL (ref 6–20)
CO2: 25 mmol/L (ref 22–32)
Calcium: 8.6 mg/dL — ABNORMAL LOW (ref 8.9–10.3)
Chloride: 103 mmol/L (ref 101–111)
Creatinine, Ser: 1.13 mg/dL (ref 0.61–1.24)
GFR calc non Af Amer: >60 mL/min (ref >60)
Glucose, Bld: 209 mg/dL — ABNORMAL HIGH (ref 65–99)
POTASSIUM: 4.3 mmol/L (ref 3.5–5.1)
SODIUM: 135 mmol/L (ref 135–145)

## 2017-01-16 LAB — CBC
HEMATOCRIT: 33 % — AB (ref 39.0–52.0)
Hemoglobin: 11.2 g/dL — ABNORMAL LOW (ref 13.0–17.0)
MCH: 29.7 pg (ref 26.0–34.0)
MCHC: 33.9 g/dL (ref 30.0–36.0)
MCV: 87.5 fL (ref 78.0–100.0)
Platelets: 197 10*3/uL (ref 150–400)
RBC: 3.77 MIL/uL — AB (ref 4.22–5.81)
RDW: 13.6 % (ref 11.5–15.5)
WBC: 14.4 10*3/uL — AB (ref 4.0–10.5)

## 2017-01-16 NOTE — Progress Notes (Signed)
Patient ID: Billy Horn, male   DOB: September 20, 1945, 71 y.o.   MRN: [id number] [facility] Surgery Progress Note:   2 Days Post-Op  Subjective: Mental status is alert;  Some somatic complaints.   Objective: Vital signs in last 24 hours: Temp:  [98.2 F (36.8 C)-99.7 F (37.6 C)] 98.6 F (37 C) (09/29 0540) Pulse Rate:  [70-94] 85 (09/29 0540) Resp:  [18] 18 (09/29 0540) BP: (164-180)/(80-97) 165/97 (09/29 0540) SpO2:  [96 %-99 %] 96 % (09/29 0540)  Intake/Output from previous day: 09/28 0701 - 09/29 0700 In: 2831 [P.O.:1260; I.V.:1571] Out: 3705 [Urine:3500; Drains:205] Intake/Output this shift: No intake/output data recorded.  Physical Exam: Work of breathing is not labored.  Some lower abdominal discomfort.  Foley in place  Lab Results:  Results for orders placed or performed during the hospital encounter of 01/14/17 (from the past 48 hour(s))  Glucose, capillary     Status: Abnormal   Collection Time: 01/14/17 11:42 AM  Result Value Ref Range   Glucose-Capillary 195 (H) 65 - 99 mg/dL  Basic metabolic panel     Status: Abnormal   Collection Time: 01/15/17  5:24 AM  Result Value Ref Range   Sodium 136 135 - 145 mmol/L   Potassium 4.3 3.5 - 5.1 mmol/L   Chloride 103 101 - 111 mmol/L   CO2 27 22 - 32 mmol/L   Glucose, Bld 207 (H) 65 - 99 mg/dL   BUN 15 6 - 20 mg/dL   Creatinine, Ser 1.36 (H) 0.61 - 1.24 mg/dL   Calcium 8.6 (L) 8.9 - 10.3 mg/dL   GFR calc non Af Amer 50 (L) >60 mL/min   GFR calc Af Amer 58 (L) >60 mL/min    Comment: (NOTE) The eGFR has been calculated using the CKD EPI equation. This calculation has not been validated in all clinical situations. eGFR's persistently <60 mL/min signify possible Chronic Kidney Disease.    Anion gap 6 5 - 15  CBC     Status: Abnormal   Collection Time: 01/15/17  5:24 AM  Result Value Ref Range   WBC 12.1 (H) 4.0 - 10.5 K/uL   RBC 3.68 (L) 4.22 - 5.81 MIL/uL   Hemoglobin 10.8 (L) 13.0 - 17.0 g/dL   HCT 31.8 (L) 39.0  - 52.0 %   MCV 86.4 78.0 - 100.0 fL   MCH 29.3 26.0 - 34.0 pg   MCHC 34.0 30.0 - 36.0 g/dL   RDW 13.5 11.5 - 15.5 %   Platelets 179 150 - 400 K/uL  Basic metabolic panel     Status: Abnormal   Collection Time: 01/16/17  5:42 AM  Result Value Ref Range   Sodium 135 135 - 145 mmol/L   Potassium 4.3 3.5 - 5.1 mmol/L   Chloride 103 101 - 111 mmol/L   CO2 25 22 - 32 mmol/L   Glucose, Bld 209 (H) 65 - 99 mg/dL   BUN 11 6 - 20 mg/dL   Creatinine, Ser 1.13 0.61 - 1.24 mg/dL   Calcium 8.6 (L) 8.9 - 10.3 mg/dL   GFR calc non Af Amer >60 >60 mL/min   GFR calc Af Amer >60 >60 mL/min    Comment: (NOTE) The eGFR has been calculated using the CKD EPI equation. This calculation has not been validated in all clinical situations. eGFR's persistently <60 mL/min signify possible Chronic Kidney Disease.    Anion gap 7 5 - 15  CBC     Status: Abnormal   Collection Time:  01/16/17  5:42 AM  Result Value Ref Range   WBC 14.4 (H) 4.0 - 10.5 K/uL   RBC 3.77 (L) 4.22 - 5.81 MIL/uL   Hemoglobin 11.2 (L) 13.0 - 17.0 g/dL   HCT 33.0 (L) 39.0 - 52.0 %   MCV 87.5 78.0 - 100.0 fL   MCH 29.7 26.0 - 34.0 pg   MCHC 33.9 30.0 - 36.0 g/dL   RDW 13.6 11.5 - 15.5 %   Platelets 197 150 - 400 K/uL    Radiology/Results: No results found.  Anti-infectives: Anti-infectives    Start     Dose/Rate Route Frequency Ordered Stop   01/14/17 2000  cefoTEtan (CEFOTAN) 2 g in dextrose 5 % 50 mL IVPB     2 g 100 mL/hr over 30 Minutes Intravenous Every 12 hours 01/14/17 1330 01/14/17 2136   01/14/17 0527  cefoTEtan in Dextrose 5% (CEFOTAN) IVPB 2 g     2 g Intravenous On call to O.R. 01/14/17 0527 01/14/17 0755      Assessment/Plan: Problem List: Patient Active Problem List   Diagnosis Date Noted  . Diverticular disease 01/14/2017  . Colonic diverticular abscess 10/27/2016  . Diverticulitis with abscess s/p robotic sigmoid colectomy 01/14/2017 10/01/2016  . Liver abscess 09/28/2016  . Anemia 09/28/2016  .  Diabetes mellitus type 2, uncontrolled ([place]) 07/27/2012  . Narcotic induced constipation 07/27/2012  . Tibial plateau fracture 07/27/2012    Will keep on full liquids for now since he is somewhat tentative on his intake.  Otherwise appears stable.   2 Days Post-Op    LOS: 2 days   Matt B. Hassell Done, MD, [facility] [phone] beeper [phone]  01/16/2017 9:37 AM

## 2017-01-17 LAB — BASIC METABOLIC PANEL
Anion gap: 6 (ref 5–15)
BUN: 12 mg/dL (ref 6–20)
CALCIUM: 8.7 mg/dL — AB (ref 8.9–10.3)
CHLORIDE: 101 mmol/L (ref 101–111)
CO2: 27 mmol/L (ref 22–32)
CREATININE: 1.07 mg/dL (ref 0.61–1.24)
GFR calc Af Amer: >60 mL/min (ref >60)
GFR calc non Af Amer: >60 mL/min (ref >60)
GLUCOSE: 199 mg/dL — AB (ref 65–99)
Potassium: 4.5 mmol/L (ref 3.5–5.1)
Sodium: 134 mmol/L — ABNORMAL LOW (ref 135–145)

## 2017-01-17 LAB — CBC
HCT: 35.6 % — ABNORMAL LOW (ref 39.0–52.0)
Hemoglobin: 12.2 g/dL — ABNORMAL LOW (ref 13.0–17.0)
MCH: 30.2 pg (ref 26.0–34.0)
MCHC: 34.3 g/dL (ref 30.0–36.0)
MCV: 88.1 fL (ref 78.0–100.0)
PLATELETS: 179 10*3/uL (ref 150–400)
RBC: 4.04 MIL/uL — ABNORMAL LOW (ref 4.22–5.81)
RDW: 13.7 % (ref 11.5–15.5)
WBC: 15 10*3/uL — ABNORMAL HIGH (ref 4.0–10.5)

## 2017-01-17 MED ORDER — PNEUMOCOCCAL VAC POLYVALENT 25 MCG/0.5ML IJ INJ
0.5000 mL | INJECTION | INTRAMUSCULAR | Status: AC
Start: 2017-01-18 — End: 2017-01-18
  Administered 2017-01-18: 0.5 mL via INTRAMUSCULAR
  Filled 2017-01-17: qty 0.5

## 2017-01-17 MED ORDER — ENSURE ENLIVE PO LIQD
237.0000 mL | Freq: Two times a day (BID) | ORAL | Status: DC
  Administered 2017-01-17: 237 mL via ORAL

## 2017-01-17 NOTE — Progress Notes (Signed)
Nutrition Brief Note  Patient identified on the Malnutrition Screening Tool (MST) Report  71 y/o male with h/o diverticular disease now s/p diverticulitis with abscess and robotic sigmoid colectomy 01/14/2017. He also has a history of prostate cancer status post robotic-assisted laparoscopic prostatectomy with Dr. Vallarie Mare Readings from Last 15 Encounters:  01/14/17 177 lb (80.3 kg)  01/04/17 177 lb 6.4 oz (80.5 kg)  11/17/16 165 lb (74.8 kg)  11/09/16 168 lb (76.2 kg)  10/27/16 168 lb (76.2 kg)  09/28/16 168 lb 3.4 oz (76.3 kg)  07/24/12 185 lb (83.9 kg)  04/06/11 199 lb 15.3 oz (90.7 kg)  03/31/11 192 lb (87.1 kg)    Body mass index is 24.01 kg/m. Patient meets criteria for normal wt based on current BMI.   Current diet order is regular, patient is consuming approximately 75% of meals at this time. Labs and medications reviewed.  Per chart, pt with wt gain and improved po intake; eating 75% of meals. RD will order Ensure Enlive po BID, each supplement provides 350 kcal and 20 grams of protein  Per MD note, pt to possibly discharge tomorrow    No nutrition interventions warranted at this time. If nutrition issues arise, please consult RD.   Koleen Distance MS, RD, LDN Pager #- [phone] After Hours Pager: [phone]

## 2017-01-17 NOTE — Progress Notes (Signed)
Patient ID: Billy Horn, male   DOB: 10-25-1945, 71 y.o.   MRN: [id number] [facility] Surgery Progress Note:   3 Days Post-Op  Subjective: Mental status is clear.  Lower abdominal pain complaints seem to have resolved. Objective: Vital signs in last 24 hours: Temp:  [98.4 F (36.9 C)-99.1 F (37.3 C)] 98.4 F (36.9 C) (09/30 0834) Pulse Rate:  [79-97] 79 (09/30 0834) Resp:  [16-20] 16 (09/30 0834) BP: (151-178)/(77-97) 162/87 (09/30 0834) SpO2:  [97 %-99 %] 99 % (09/30 0834)  Intake/Output from previous day: 09/29 0701 - 09/30 0700 In: 1260 [P.O.:360; I.V.:900] Out: 2085 [Urine:2000; Drains:85] Intake/Output this shift: Total I/O In: 240 [P.O.:240] Out: 75 [Urine:75]  Physical Exam: Work of breathing is normal.  Abdomen is less distended.  Lab Results:  Results for orders placed or performed during the hospital encounter of 01/14/17 (from the past 48 hour(s))  Basic metabolic panel     Status: Abnormal   Collection Time: 01/16/17  5:42 AM  Result Value Ref Range   Sodium 135 135 - 145 mmol/L   Potassium 4.3 3.5 - 5.1 mmol/L   Chloride 103 101 - 111 mmol/L   CO2 25 22 - 32 mmol/L   Glucose, Bld 209 (H) 65 - 99 mg/dL   BUN 11 6 - 20 mg/dL   Creatinine, Ser 1.13 0.61 - 1.24 mg/dL   Calcium 8.6 (L) 8.9 - 10.3 mg/dL   GFR calc non Af Amer >60 >60 mL/min   GFR calc Af Amer >60 >60 mL/min    Comment: (NOTE) The eGFR has been calculated using the CKD EPI equation. This calculation has not been validated in all clinical situations. eGFR's persistently <60 mL/min signify possible Chronic Kidney Disease.    Anion gap 7 5 - 15  CBC     Status: Abnormal   Collection Time: 01/16/17  5:42 AM  Result Value Ref Range   WBC 14.4 (H) 4.0 - 10.5 K/uL   RBC 3.77 (L) 4.22 - 5.81 MIL/uL   Hemoglobin 11.2 (L) 13.0 - 17.0 g/dL   HCT 33.0 (L) 39.0 - 52.0 %   MCV 87.5 78.0 - 100.0 fL   MCH 29.7 26.0 - 34.0 pg   MCHC 33.9 30.0 - 36.0 g/dL   RDW 13.6 11.5 - 15.5 %   Platelets 197  150 - 400 K/uL  Basic metabolic panel     Status: Abnormal   Collection Time: 01/17/17  5:25 AM  Result Value Ref Range   Sodium 134 (L) 135 - 145 mmol/L   Potassium 4.5 3.5 - 5.1 mmol/L   Chloride 101 101 - 111 mmol/L   CO2 27 22 - 32 mmol/L   Glucose, Bld 199 (H) 65 - 99 mg/dL   BUN 12 6 - 20 mg/dL   Creatinine, Ser 1.07 0.61 - 1.24 mg/dL   Calcium 8.7 (L) 8.9 - 10.3 mg/dL   GFR calc non Af Amer >60 >60 mL/min   GFR calc Af Amer >60 >60 mL/min    Comment: (NOTE) The eGFR has been calculated using the CKD EPI equation. This calculation has not been validated in all clinical situations. eGFR's persistently <60 mL/min signify possible Chronic Kidney Disease.    Anion gap 6 5 - 15  CBC     Status: Abnormal   Collection Time: 01/17/17  5:25 AM  Result Value Ref Range   WBC 15.0 (H) 4.0 - 10.5 K/uL   RBC 4.04 (L) 4.22 - 5.81 MIL/uL   Hemoglobin  12.2 (L) 13.0 - 17.0 g/dL   HCT 35.6 (L) 39.0 - 52.0 %   MCV 88.1 78.0 - 100.0 fL   MCH 30.2 26.0 - 34.0 pg   MCHC 34.3 30.0 - 36.0 g/dL   RDW 13.7 11.5 - 15.5 %   Platelets 179 150 - 400 K/uL    Radiology/Results: No results found.  Anti-infectives: Anti-infectives    Start     Dose/Rate Route Frequency Ordered Stop   01/14/17 2000  cefoTEtan (CEFOTAN) 2 g in dextrose 5 % 50 mL IVPB     2 g 100 mL/hr over 30 Minutes Intravenous Every 12 hours 01/14/17 1330 01/14/17 2136   01/14/17 0527  cefoTEtan in Dextrose 5% (CEFOTAN) IVPB 2 g     2 g Intravenous On call to O.R. 01/14/17 0527 01/14/17 0755      Assessment/Plan: Problem List: Patient Active Problem List   Diagnosis Date Noted  . Diverticular disease 01/14/2017  . Colonic diverticular abscess 10/27/2016  . Diverticulitis with abscess s/p robotic sigmoid colectomy 01/14/2017 10/01/2016  . Liver abscess 09/28/2016  . Anemia 09/28/2016  . Diabetes mellitus type 2, uncontrolled ([place]) 07/27/2012  . Narcotic induced constipation 07/27/2012  . Tibial plateau fracture  07/27/2012    Foley in place.  Likely ready for discharge tomorrow.  Disposition of Foley will defer to Dr. Marcello Moores.   3 Days Post-Op    LOS: 3 days   Matt B. Hassell Done, MD, [facility] [phone] beeper [phone]  01/17/2017 9:48 AM

## 2017-01-18 NOTE — Discharge Instructions (Signed)

## 2017-01-18 NOTE — Progress Notes (Signed)
Pt alert, orienting, ambulating, and voiding with no problems.  D/C instructions were given, all questions answered.

## 2017-01-18 NOTE — Discharge Summary (Signed)
Physician Discharge Summary  Patient ID: Billy Horn MRN: [id number] DOB/AGE: 72-06-1945 71 y.o.  Admit date: 01/14/2017 Discharge date: 01/18/2017  Admission Diagnoses: Complicated Diverticulitis  Discharge Diagnoses:  Principal Problem:   Diverticulitis with abscess s/p robotic sigmoid colectomy 01/14/2017 Active Problems:   Colonic diverticular abscess   Diverticular disease   Discharged Condition: good  Hospital Course: Pt admitted after surgery.  Diet was advanced as tolerated.  By POD 4 he was tolerating a diet and pain was controlled.  Dr Gloriann Loan stated his foley could be removed prior to d/c home.  Consults: urology  Significant Diagnostic Studies: labs: cbc, chemistry  Treatments: IV hydration, analgesia: acetaminophen and surgery: robotic sigmoidectomy  Discharge Exam: Blood pressure (!) 158/81, pulse 75, temperature 97.8 F (36.6 C), temperature source Oral, resp. rate 18, height 6' (1.829 m), weight 80.3 kg (177 lb), SpO2 98 %. General appearance: alert and cooperative GI: normal findings: soft, non-tender Incision/Wound: clean, dry, intact  Disposition: 01-Home or Self Care    Follow-up Information    Leighton Ruff, MD. Schedule an appointment as soon as possible for a visit in 2 week(s).   Specialty:  General Surgery Contact information: [address] [address] [phone]           Signed: Rosario Adie 94/10/960, 8:36 AM

## 2017-02-01 ENCOUNTER — Encounter: Payer: Self-pay | Admitting: Interventional Radiology

## 2017-02-15 DIAGNOSIS — E119 Type 2 diabetes mellitus without complications: Secondary | ICD-10-CM | POA: Diagnosis not present

## 2017-02-15 DIAGNOSIS — I10 Essential (primary) hypertension: Secondary | ICD-10-CM | POA: Diagnosis not present

## 2017-02-22 DIAGNOSIS — R69 Illness, unspecified: Secondary | ICD-10-CM | POA: Diagnosis not present

## 2017-04-09 DIAGNOSIS — H40003 Preglaucoma, unspecified, bilateral: Secondary | ICD-10-CM | POA: Diagnosis not present

## 2017-07-21 DIAGNOSIS — C61 Malignant neoplasm of prostate: Secondary | ICD-10-CM | POA: Diagnosis not present

## 2017-09-09 DIAGNOSIS — H90A31 Mixed conductive and sensorineural hearing loss, unilateral, right ear with restricted hearing on the contralateral side: Secondary | ICD-10-CM | POA: Diagnosis not present

## 2017-09-09 DIAGNOSIS — H906 Mixed conductive and sensorineural hearing loss, bilateral: Secondary | ICD-10-CM | POA: Diagnosis not present

## 2017-09-09 DIAGNOSIS — H6121 Impacted cerumen, right ear: Secondary | ICD-10-CM | POA: Diagnosis not present

## 2017-09-09 DIAGNOSIS — H90A22 Sensorineural hearing loss, unilateral, left ear, with restricted hearing on the contralateral side: Secondary | ICD-10-CM | POA: Diagnosis not present

## 2017-09-09 DIAGNOSIS — H7201 Central perforation of tympanic membrane, right ear: Secondary | ICD-10-CM | POA: Diagnosis not present

## 2017-10-28 DIAGNOSIS — H90A31 Mixed conductive and sensorineural hearing loss, unilateral, right ear with restricted hearing on the contralateral side: Secondary | ICD-10-CM | POA: Diagnosis not present

## 2017-10-28 DIAGNOSIS — H7291 Unspecified perforation of tympanic membrane, right ear: Secondary | ICD-10-CM | POA: Diagnosis not present

## 2018-01-31 DIAGNOSIS — K432 Incisional hernia without obstruction or gangrene: Secondary | ICD-10-CM | POA: Diagnosis not present

## 2018-02-15 ENCOUNTER — Ambulatory Visit: Payer: Self-pay | Admitting: Surgery

## 2018-02-15 DIAGNOSIS — K432 Incisional hernia without obstruction or gangrene: Secondary | ICD-10-CM | POA: Diagnosis not present

## 2018-02-16 ENCOUNTER — Ambulatory Visit: Payer: Self-pay | Admitting: Surgery

## 2018-02-16 DIAGNOSIS — I10 Essential (primary) hypertension: Secondary | ICD-10-CM | POA: Diagnosis not present

## 2018-02-16 DIAGNOSIS — E119 Type 2 diabetes mellitus without complications: Secondary | ICD-10-CM | POA: Diagnosis not present

## 2018-02-16 NOTE — H&P (Signed)
Billy Horn Documented: 02/15/2018 9:45 AM Location: [facility] Patient #: [id number] DOB: 02/15/1946 Single / Language: Billy Horn / Race: White Male   History of Present Illness Billy Hector MD; 02/16/2018 7:11 AM) The patient is a 72 year old male who presents with an incisional hernia. Note for "Incisional hernia": ` ` ` Patient sent for surgical consultation at the request of Dr Billy Horn  Chief Complaint: Incisional hernia ` ` The patient is a pleasant active male. History of prostate cancer status post robotic prostatectomy by Dr. Alinda Horn. cancer free so far.Had supraumbilical incisional hernia. Felt A Severe Diverticulitis Requiring Colectomy. Had Postoperative Liver Abscess.eventually he recovered from all that but did develop a recurrent incisional hernia. Did not particular bother him last year. However he has noticed its Larger and is causing some swelling and occasional discomfort when he is active. He does a lot of yard work and after moving several 100 bags of mulch became much more parenting concerning. He discussed with his colorectal surgeon. Dr. Marcello Horn recommended consideration of repair. Patient referred to me for evaluation.  Patient does not smoke. I believe he is a borderline diabetic but has been able to control things with exercise and weight loss. Not on any medication. Moving his bowels regularly. No recurrent diverticulitis flares. no evidence of any recurrent prostate cancer.He can walk several miles without difficulty. No recurrent infections.  (Review of systems as stated in this history (HPI) or in the review of systems. Otherwise all other 12 point ROS are negative) ` ` `   Allergies (Billy Horn, RMA; 02/15/2018 9:45 AM) No Known Allergies [12/01/2016]: Allergies Reconciled   Medication History [employer], RMA; 02/15/2018 9:45 AM) Roma Schanz (In Vitro) Active. Meloxicam (15MG  Tablet, Oral)  Active. Aspirin (81MG  Tablet Chewable, Oral) Active. Iron (325 (65 Fe)MG Tablet, Oral) Active. Metamucil (0.52GM Capsule, Oral) Active. Calcium Carbonate-Iron (Oral) Specific strength unknown - Active. Multivitamin Adults (Oral) Active. Medications Reconciled  Vitals (Billy Horn RMA; 02/15/2018 9:46 AM) 02/15/2018 9:45 AM Weight: 197 lb Height: 72in Body Surface Area: 2.12 m Body Mass Index: 26.72 kg/m  Temp.: 97.86F(Temporal)  Pulse: 86 (Regular)  P.OX: 96% (Room air) BP: 158/70 (Sitting, Left Arm, Standard)       Physical Exam Billy Hector MD; 02/16/2018 7:11 AM) General Mental Status-Alert. General Appearance-Not in acute distress, Not Sickly. Orientation-Oriented X3. Hydration-Well hydrated. Voice-Normal.  Integumentary Global Assessment Upon inspection and palpation of skin surfaces of the - Axillae: non-tender, no inflammation or ulceration, no drainage. and Distribution of scalp and body hair is normal. General Characteristics Temperature - normal warmth is noted.  Head and Neck Head-normocephalic, atraumatic with no lesions or palpable masses. Face Global Assessment - atraumatic, no absence of expression. Neck Global Assessment - no abnormal movements, no bruit auscultated on the right, no bruit auscultated on the left, no decreased range of motion, non-tender. Trachea-midline. Thyroid Gland Characteristics - non-tender.  Eye Eyeball - Left-Extraocular movements intact, No Nystagmus. Eyeball - Right-Extraocular movements intact, No Nystagmus. Cornea - Left-No Hazy. Cornea - Right-No Hazy. Sclera/Conjunctiva - Left-No scleral icterus, No Discharge. Sclera/Conjunctiva - Right-No scleral icterus, No Discharge. Pupil - Left-Direct reaction to light normal. Pupil - Right-Direct reaction to light normal.  ENMT Ears Pinna - Left - no drainage observed, no generalized tenderness observed. Right - no  drainage observed, no generalized tenderness observed. Nose and Sinuses External Inspection of the Nose - no destructive lesion observed. Inspection of the nares - Left - quiet respiration. Right - quiet  respiration. Mouth and Throat Lips - Upper Lip - no fissures observed, no pallor noted. Lower Lip - no fissures observed, no pallor noted. Nasopharynx - no discharge present. Oral Cavity/Oropharynx - Tongue - no dryness observed. Oral Mucosa - no cyanosis observed. Hypopharynx - no evidence of airway distress observed.  Chest and Lung Exam Inspection Movements - Normal and Symmetrical. Accessory muscles - No use of accessory muscles in breathing. Palpation Palpation of the chest reveals - Non-tender. Auscultation Breath sounds - Normal and Clear.  Cardiovascular Auscultation Rhythm - Regular. Murmurs & Other Heart Sounds - Auscultation of the heart reveals - No Murmurs and No Systolic Clicks.  Abdomen Inspection Inspection of the abdomen reveals - No Visible peristalsis and No Abnormal pulsations. Umbilicus - No Bleeding, No Urine drainage. Palpation/Percussion Palpation and Percussion of the abdomen reveal - Soft, Non Tender, No Rebound tenderness, No Rigidity (guarding) and No Cutaneous hyperesthesia. Note: supraumbilical bulging consistent with recurrent hernia. 6 x 6 cm region reduces down to smaller fascial defect. Abdomen soft. Nontender. Not distended. No other umbilical or incisional hernias. No guarding.   Male Genitourinary Sexual Maturity Tanner 5 - Adult hair pattern and Adult penile size and shape.  Peripheral Vascular Upper Extremity Inspection - Left - No Cyanotic nailbeds, Not Ischemic. Right - No Cyanotic nailbeds, Not Ischemic.  Neurologic Neurologic evaluation reveals -normal attention span and ability to concentrate, able to name objects and repeat phrases. Appropriate fund of knowledge , normal sensation and normal coordination. Mental Status Affect -  not angry, not paranoid. Cranial Nerves-Normal Bilaterally. Gait-Normal.  Neuropsychiatric Mental status exam performed with findings of-able to articulate well with normal speech/language, rate, volume and coherence, thought content normal with ability to perform basic computations and apply abstract reasoning and no evidence of hallucinations, delusions, obsessions or homicidal/suicidal ideation.  Musculoskeletal Global Assessment Spine, Ribs and Pelvis - no instability, subluxation or laxity. Right Upper Extremity - no instability, subluxation or laxity.  Lymphatic Head & Neck  General Head & Neck Lymphatics: Bilateral - Description - No Localized lymphadenopathy. Axillary  General Axillary Region: Bilateral - Description - No Localized lymphadenopathy. Femoral & Inguinal  Generalized Femoral & Inguinal Lymphatics: Left - Description - No Localized lymphadenopathy. Right - Description - No Localized lymphadenopathy.    Assessment & Plan Billy Hector MD; 02/16/2018 7:11 AM) Billy Horn HERNIA, WITHOUT OBSTRUCTION OR GANGRENE (K43.2) Impression: 72 year old male with history of robotic prostatectomy with incisional hernia. Diverticulitis requiring robotic colectomy and primary incisional hernia repair. Recurrence again supraumbilically.  I think he would benefit from repair. Underlay with mesh. He's been infection free for the past year. He is very active gentleman. Not a giant defect. Probably can be outpatient but may need to stay overnight. We will see.  It is not causing severe issues its time, so hopefully can wait until after the holiday season. PREOP - [place] - ENCOUNTER FOR PREOPERATIVE EXAMINATION FOR GENERAL SURGICAL PROCEDURE (Z01.818) Current Plans You are being scheduled for surgery- Our schedulers will call you.  You should hear from our office's scheduling department within 5 working days about the location, date, and time of surgery. We try to make  accommodations for patient's preferences in scheduling surgery, but sometimes the OR schedule or the surgeon's schedule prevents [place] from making those accommodations.  If you have not heard from our office [phone]) in 5 working days, call the office and ask for your surgeon's nurse.  If you have other questions about your diagnosis, plan, or surgery, call the office  and ask for your surgeon's nurse.  Written instructions provided The anatomy & physiology of the abdominal wall was discussed. The pathophysiology of hernias was discussed. Natural history risks without surgery including progeressive enlargement, pain, incarceration, & strangulation was discussed. Contributors to complications such as smoking, obesity, diabetes, prior surgery, etc were discussed.  I feel the risks of no intervention will lead to serious problems that outweigh the operative risks; therefore, I recommended surgery to reduce and repair the hernia. I explained laparoscopic techniques with possible need for an open approach. I noted the probable use of mesh to patch and/or buttress the hernia repair  Risks such as bleeding, infection, abscess, need for further treatment, heart attack, death, and other risks were discussed. I noted a good likelihood this will help address the problem. Goals of post-operative recovery were discussed as well. Possibility that this will not correct all symptoms was explained. I stressed the importance of low-impact activity, aggressive pain control, avoiding constipation, & not pushing through pain to minimize risk of post-operative chronic pain or injury. Possibility of reherniation especially with smoking, obesity, diabetes, immunosuppression, and other health conditions was discussed. We will work to minimize complications.  An educational handout further explaining the pathology & treatment options was given as well. Questions were answered. The patient expresses  understanding & wishes to proceed with surgery.  Pt Education - [facility] Hernia Post-Op HCI (Tyleigh Mahn): discussed with patient and provided information. Pt Education - [facility] Pain Control (Carles Florea) Pt Education - Pamphlet Given - Laparoscopic Hernia Repair: discussed with patient and provided information. Pt Education - [facility] Mesh education: discussed with patient and provided information.   Signed by Billy Hector, MD (02/16/2018 7:12 AM)  Billy Hector, MD, FACS, MASCRS Gastrointestinal and Minimally Invasive Surgery    [address], [place], [place] [address] [phone] Main / Paging [phone] Fax

## 2018-04-13 IMAGING — CT CT ABD-PELV W/ CM
2 of 4 series · 11 of 36 positions shown, 18 images · IV contrast ([ID] ISOVUE 300)
Comparison: 09/30/2016

CLINICAL DATA: Follow-up liver abscess

EXAM:
CT ABDOMEN AND PELVIS WITH CONTRAST
TECHNIQUE: Multidetector CT imaging of the abdomen and pelvis was performed
using the standard protocol following bolus administration of
intravenous contrast.
CONTRAST:  100mL [id number] IOPAMIDOL ([id number]) INJECTION 61%

[Series 3: abd/pelvis with · axial · 0.78mm/px · z∈[-392,+13]mm · 10 of 100 slices shown, 16 images]
[im 10/100  soft-tissue]
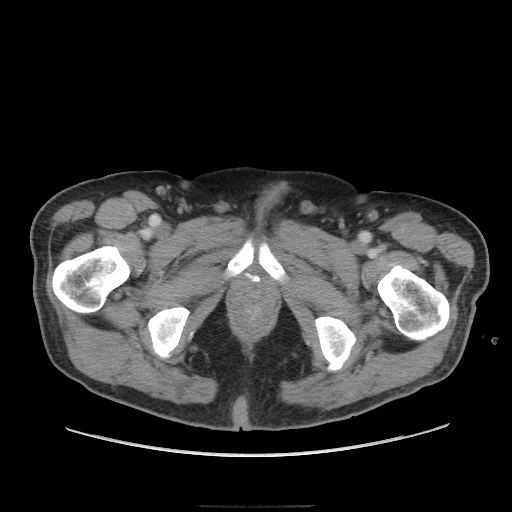
[im 10/100  bone]
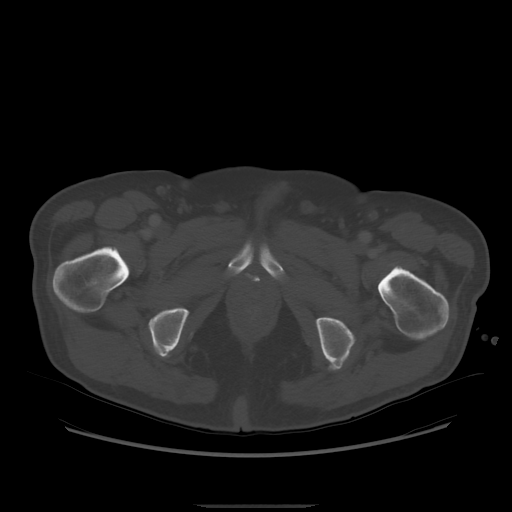
[im 19/100  soft-tissue]
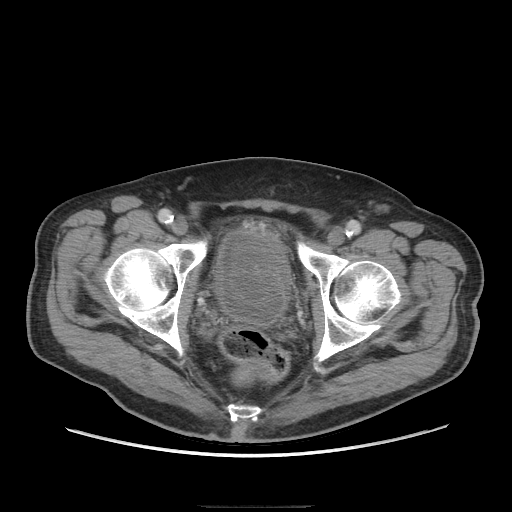
[im 28/100  soft-tissue]
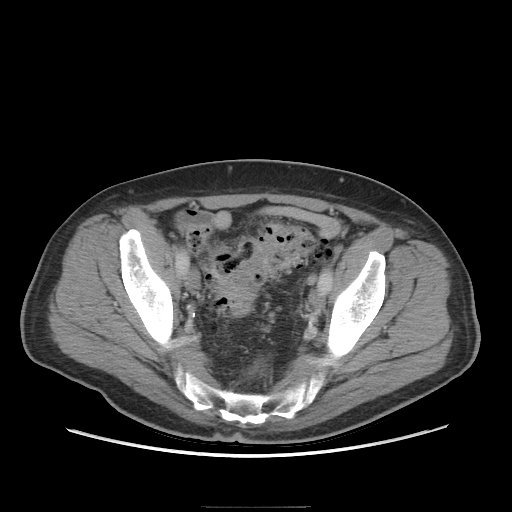
[im 37/100  soft-tissue]
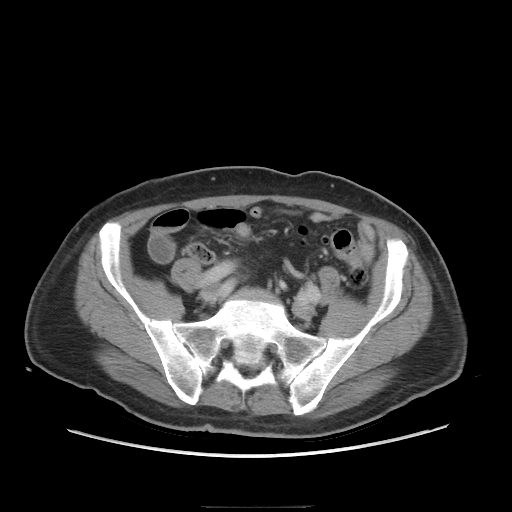
[im 46/100  soft-tissue]
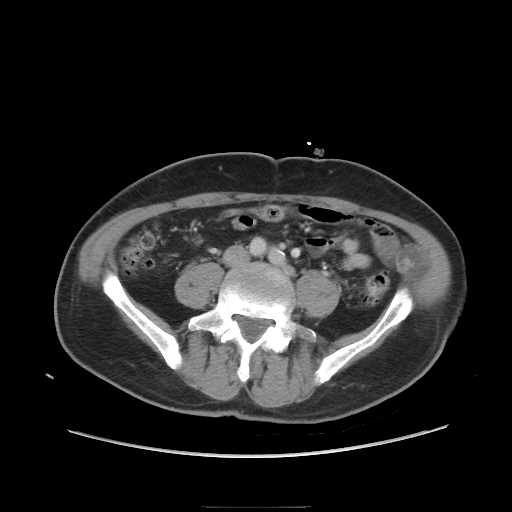
[im 55/100  soft-tissue]
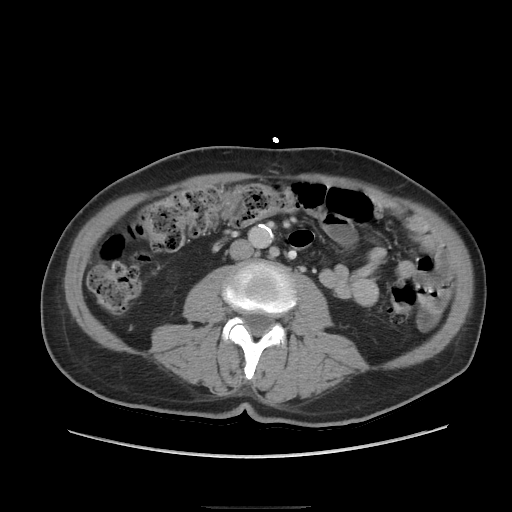
[im 64/100  soft-tissue]
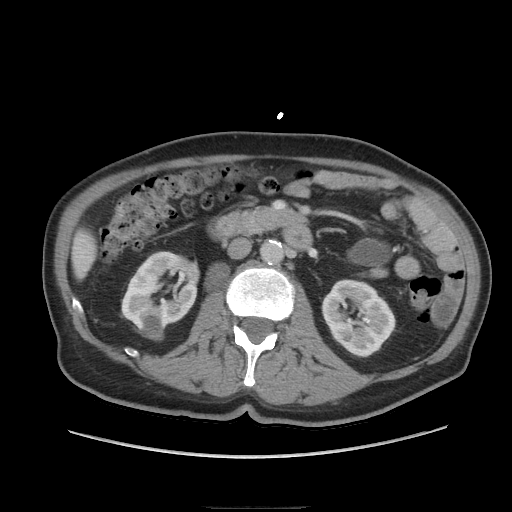
[im 64/100  lung]
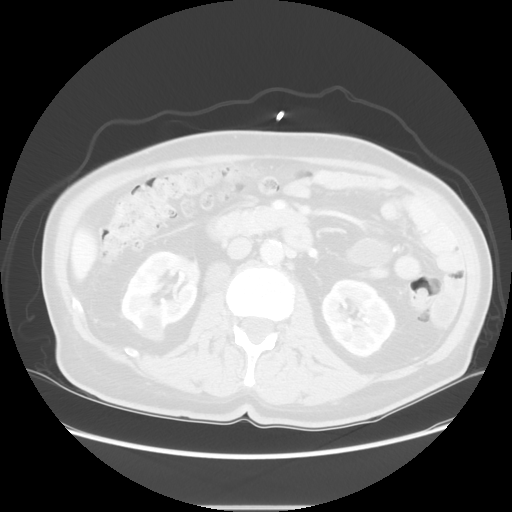
[im 73/100  soft-tissue]
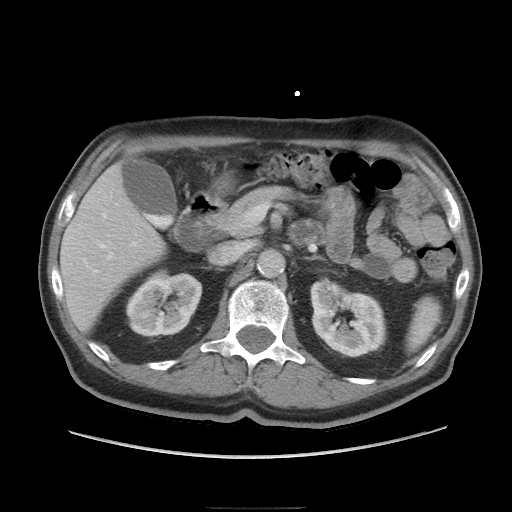
[im 73/100  lung]
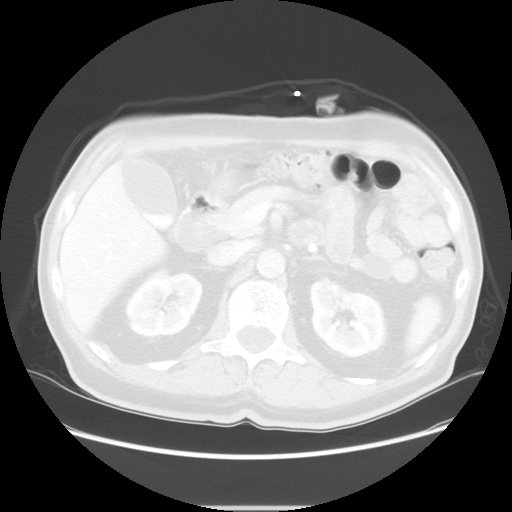
[im 82/100  soft-tissue]
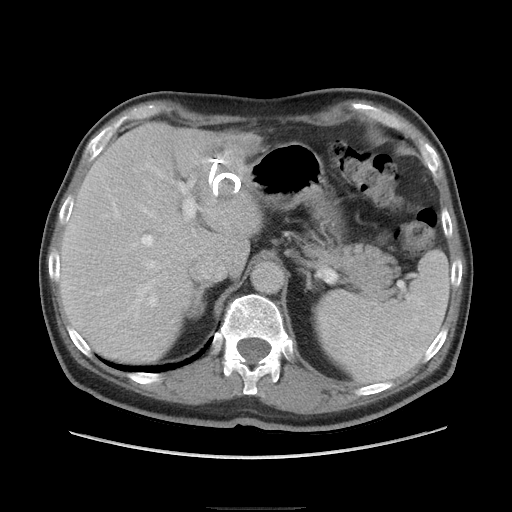
[im 82/100  lung]
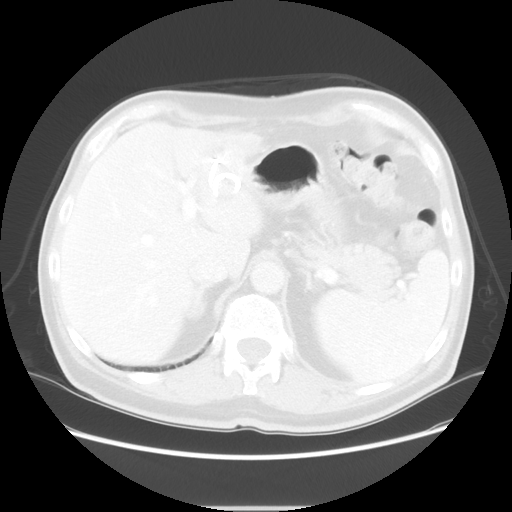
[im 82/100  bone]
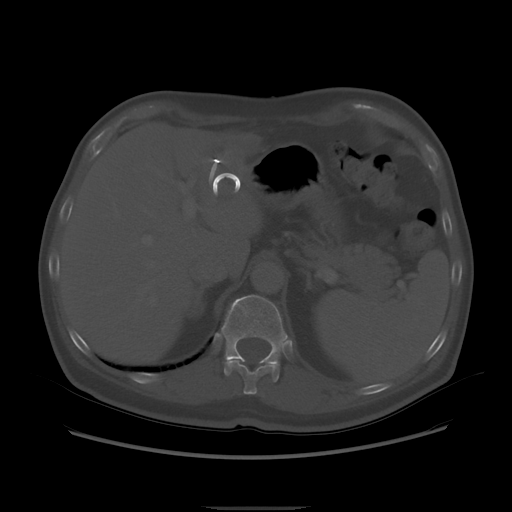
[im 91/100  soft-tissue]
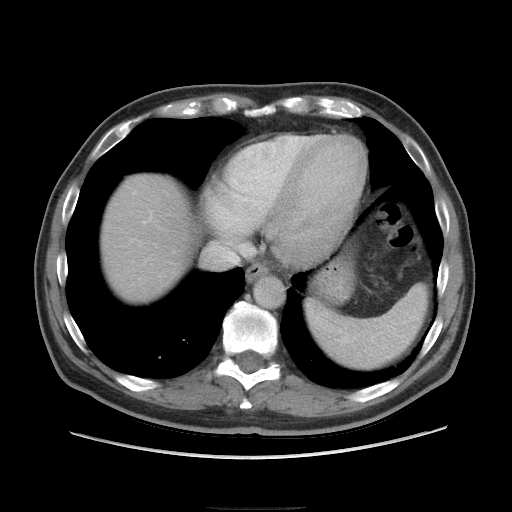
[im 91/100  lung]
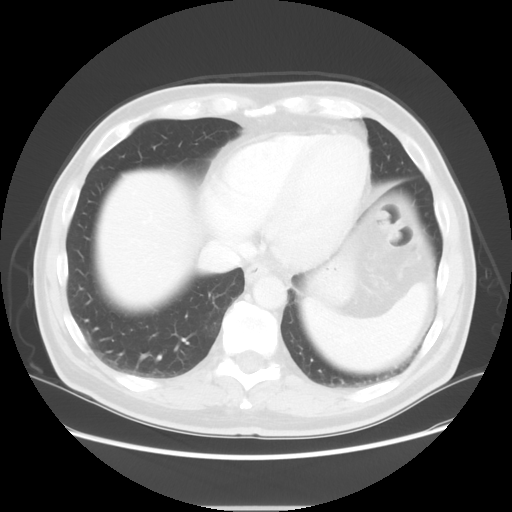

[Series 601: coronal body · coronal · 0.98mm/px · 1 of 116 slices shown, 2 images]
[im 39/116  soft-tissue]
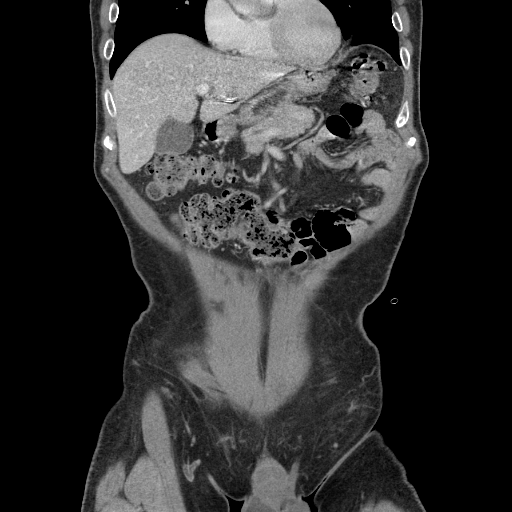
[im 39/116  bone]
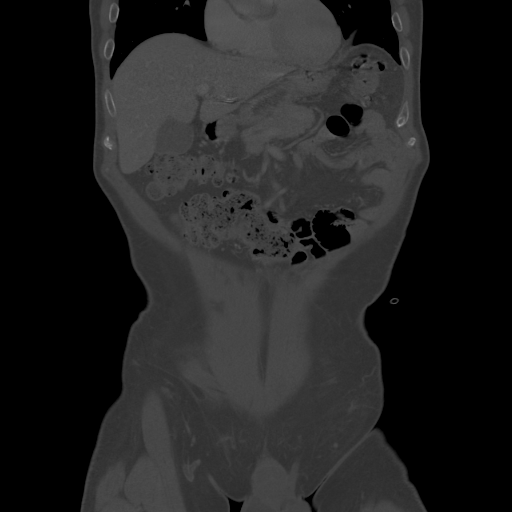

[11 of 36 positions shown; findings below may reference images not displayed]

FINDINGS: Lower chest: Minimal dependent atelectasis

Hepatobiliary: A drain is present in the left lobe of the liver. The
previously seen liver abscess has completely resolved. No new
abscess. Cholelithiasis.

Pancreas: Unremarkable

Spleen: Unremarkable

Adrenals/Urinary Tract: Stable hypodense and hyperdense renal
lesions which are previously characterized as cysts. Stable left
nephrolithiasis. No hydronephrosis. Adrenal glands are unremarkable.

Bladder will be described below.

Stomach/Bowel: Stomach is unremarkable.

Appendix is within normal limits.

Moderate stool burden throughout the colon. Diverticulosis of the
colon is present. There is persistent wall thickening of the sigmoid
colon which has improved. The thick walled fluid, gas, and stool
collection extending from the sigmoid colon towards the dome of the
bladder is stable. This either represents a chronic abscess or large
diverticulum. The dome of the bladder is abnormal with a multi
lamellated appearance. This probably represents severe of edema
within the wall of the dome of the bladder are can't or fluid within
the dome or the wall of the bladder.

Vascular/Lymphatic: No evidence of aortic aneurysm. Atherosclerotic
calcifications of the aorta and iliac arterial system. No abnormal
retroperitoneal adenopathy. Borderline left inguinal adenopathy. 11
mm short axis diameter left inguinal lymph node is not significantly
changed.

Reproductive: Prostate is absent.

Other: No anterior abdominal wall hernia.

Musculoskeletal: No vertebral compression deformity. L4-5 shallow
disc protrusion.
IMPRESSION: After placement of an abscess strain, the liver abscess has
resolved.

Persistent thick walled fluid, gas, and stool collection extending
from the sigmoid colon to the dome of the bladder. This either
represents a chronic perforation which has been walled-off for a
large diverticulum.

Improving wall thickening and inflammatory change of the sigmoid
colon.

Stable abnormal appearance of the dome of the bladder as described
representing either edema in the wall or fluid in the wall of the
dome.

## 2018-05-02 NOTE — Patient Instructions (Addendum)
Billy Horn  05/02/2018   Your procedure is scheduled on: 05-12-18    Report to [facility] Main  Entrance    Report to Admitting at 5:30 AM    Call this number if you have problems the morning of surgery [phone]    Remember: NO SOLID FOOD AFTER MIDNIGHT THE NIGHT PRIOR TO SURGERY. NOTHING BY MOUTH EXCEPT CLEAR LIQUIDS UNTIL 3 HOURS PRIOR TO Billy Horn SURGERY. PLEASE FINISH ENSURE DRINK PER SURGEON ORDER 3 HOURS PRIOR TO SCHEDULED SURGERY TIME WHICH NEEDS TO BE COMPLETED AT 4:30 AM.   CLEAR LIQUID DIET   Foods Allowed                                                                     Foods Excluded  Coffee and tea, regular and decaf                             liquids that you cannot  Plain Jell-O in any flavor                                             see through such as: Fruit ices (not with fruit pulp)                                     milk, soups, orange juice  Iced Popsicles                                    All solid food Carbonated beverages, regular and diet                                    Cranberry, grape and apple juices Sports drinks like Gatorade Lightly seasoned clear broth or consume(fat free) Sugar, honey syrup  Sample Menu Breakfast                                Lunch                                     Supper Cranberry juice                    Beef broth                            Chicken broth Jell-O                                     Grape juice  Apple juice Coffee or tea                        Jell-O                                      Popsicle                                                Coffee or tea                        Coffee or tea  _____________________________________________________________________    Take these medicines the morning of surgery with A SIP OF WATER: None   BRUSH YOUR TEETH MORNING OF SURGERY AND RINSE YOUR MOUTH OUT, NO CHEWING GUM CANDY OR MINTS.                  You may not have any metal on your body including hair pins and              piercings  Do not wear jewelry, cologne, lotions, powders or deodorant             Men may shave face and neck.   Do not bring valuables to the hospital. [place] [employer].  Contacts, dentures or bridgework may not be worn into surgery.  Leave suitcase in the car. After surgery it may be brought to your room.     Patients discharged the day of surgery will not be allowed to drive home. IF YOU ARE HAVING SURGERY AND GOING HOME THE SAME DAY, YOU MUST HAVE AN ADULT TO DRIVE YOU HOME AND BE WITH YOU FOR 24 HOURS. YOU MAY GO HOME BY TAXI OR UBER OR ORTHERWISE, BUT AN ADULT MUST ACCOMPANY YOU HOME AND STAY WITH YOU FOR 24 HOURS.    Name and phone number of your driver: Billy Horn [phone]  Special Instructions:               Please read over the following fact sheets you were given: _____________________________________________________________________             [facility] - Preparing for Surgery Before surgery, you can play an important role.  Because skin is not sterile, your skin needs to be as free of germs as possible.  You can reduce the number of germs on your skin by washing with CHG (chlorahexidine gluconate) soap before surgery.  CHG is an antiseptic cleaner which kills germs and bonds with the skin to continue killing germs even after washing. Please DO NOT use if you have an allergy to CHG or antibacterial soaps.  If your skin becomes reddened/irritated stop using the CHG and inform your nurse when you arrive at Short Stay. Do not shave (including legs and underarms) for at least 48 hours prior to the first CHG shower.  You may shave your face/neck. Please follow these instructions carefully:  1.  Shower with CHG Soap the night before surgery and the  morning of Surgery.  2.  If you choose to wash your hair, wash your hair first as usual with  your  normal  shampoo.  3.  After you shampoo, rinse your hair and body thoroughly to remove the  shampoo.                           4.  Use CHG as you would any other liquid soap.  You can apply chg directly  to the skin and wash                       Gently with a scrungie or clean washcloth.  5.  Apply the CHG Soap to your body ONLY FROM THE NECK DOWN.   Do not use on face/ open                           Wound or open sores. Avoid contact with eyes, ears mouth and genitals (private parts).                       Wash face,  Genitals (private parts) with your normal soap.             6.  Wash thoroughly, paying special attention to the area where your surgery  will be performed.  7.  Thoroughly rinse your body with warm water from the neck down.  8.  DO NOT shower/wash with your normal soap after using and rinsing off  the CHG Soap.                9.  Pat yourself dry with a clean towel.            10.  Wear clean pajamas.            11.  Place clean sheets on your bed the night of your first shower and do not  sleep with pets. Day of Surgery : Do not apply any lotions/deodorants the morning of surgery.  Please wear clean clothes to the hospital/surgery center.  FAILURE TO FOLLOW THESE INSTRUCTIONS MAY RESULT IN THE CANCELLATION OF YOUR SURGERY PATIENT SIGNATURE_________________________________  NURSE SIGNATURE__________________________________  ________________________________________________________________________

## 2018-05-04 ENCOUNTER — Encounter (HOSPITAL_COMMUNITY): Payer: Self-pay

## 2018-05-04 ENCOUNTER — Other Ambulatory Visit: Payer: Self-pay

## 2018-05-04 ENCOUNTER — Encounter (HOSPITAL_COMMUNITY)
Admission: RE | Admit: 2018-05-04 | Discharge: 2018-05-04 | Disposition: A | Discharge status: Home / Self Care | Payer: PPO | Source: Ambulatory Visit | Attending: Surgery | Admitting: Surgery

## 2018-05-04 DIAGNOSIS — K432 Incisional hernia without obstruction or gangrene: Secondary | ICD-10-CM | POA: Diagnosis not present

## 2018-05-04 DIAGNOSIS — R001 Bradycardia, unspecified: Secondary | ICD-10-CM | POA: Diagnosis not present

## 2018-05-04 DIAGNOSIS — I10 Essential (primary) hypertension: Secondary | ICD-10-CM | POA: Diagnosis not present

## 2018-05-04 DIAGNOSIS — Z01818 Encounter for other preprocedural examination: Secondary | ICD-10-CM | POA: Insufficient documentation

## 2018-05-04 LAB — BASIC METABOLIC PANEL
Anion gap: 7 (ref 5–15)
BUN: 17 mg/dL (ref 8–23)
CO2: 28 mmol/L (ref 22–32)
Calcium: 9.5 mg/dL (ref 8.9–10.3)
Chloride: 104 mmol/L (ref 98–111)
Creatinine, Ser: 1.16 mg/dL (ref 0.61–1.24)
GFR calc Af Amer: >60 mL/min (ref >60)
GFR calc non Af Amer: >60 mL/min (ref >60)
Glucose, Bld: 214 mg/dL — ABNORMAL HIGH (ref 70–99)
Potassium: 4.3 mmol/L (ref 3.5–5.1)
Sodium: 139 mmol/L (ref 135–145)

## 2018-05-04 LAB — CBC
HCT: 39.9 % (ref 39.0–52.0)
Hemoglobin: 13.8 g/dL (ref 13.0–17.0)
MCH: 32.2 pg (ref 26.0–34.0)
MCHC: 34.6 g/dL (ref 30.0–36.0)
MCV: 93 fL (ref 80.0–100.0)
Platelets: 198 10*3/uL (ref 150–400)
RBC: 4.29 MIL/uL (ref 4.22–5.81)
RDW: 13.1 % (ref 11.5–15.5)
WBC: 5.3 10*3/uL (ref 4.0–10.5)
nRBC: 0 % (ref 0.0–0.2)

## 2018-05-04 LAB — HEMOGLOBIN A1C
Hgb A1c MFr Bld: 6.8 % — ABNORMAL HIGH (ref 4.8–5.6)
Mean Plasma Glucose: 148.46 mg/dL

## 2018-05-11 ENCOUNTER — Encounter (HOSPITAL_COMMUNITY): Payer: Self-pay | Admitting: Anesthesiology

## 2018-05-11 MED ORDER — BUPIVACAINE LIPOSOME 1.3 % IJ SUSP
20.0000 mL | INTRAMUSCULAR | Status: DC
  Filled 2018-05-11: qty 20

## 2018-05-11 NOTE — Anesthesia Preprocedure Evaluation (Addendum)
Anesthesia Evaluation  Patient identified by MRN, date of birth, ID band Patient awake    Reviewed: Allergy & Precautions, NPO status , Patient's Chart, lab work & pertinent test results  Airway Mallampati: II  TM Distance: >3 FB Neck ROM: Full    Dental  (+) Teeth Intact, Dental Advisory Given   Pulmonary neg pulmonary ROS,    breath sounds clear to auscultation       Cardiovascular negative cardio ROS   Rhythm:Regular Rate:Normal     Neuro/Psych negative neurological ROS  negative psych ROS   GI/Hepatic negative GI ROS, Neg liver ROS,   Endo/Other  diabetes  Renal/GU negative Renal ROS     Musculoskeletal negative musculoskeletal ROS (+)   Abdominal Normal abdominal exam  (+)   Peds  Hematology   Anesthesia Other Findings   Reproductive/Obstetrics                            Anesthesia Physical Anesthesia Plan  ASA: II  Anesthesia Plan: General   Post-op Pain Management:    Induction: Intravenous  PONV Risk Score and Plan: 3 and Ondansetron, Dexamethasone and Treatment may vary due to age or medical condition  Airway Management Planned: Oral ETT  Additional Equipment: None  Intra-op Plan:   Post-operative Plan: Extubation in OR  Informed Consent: I have reviewed the patients History and Physical, chart, labs and discussed the procedure including the risks, benefits and alternatives for the proposed anesthesia with the patient or authorized representative who has indicated his/her understanding and acceptance.     Dental advisory given  Plan Discussed with: CRNA  Anesthesia Plan Comments:        Anesthesia Quick Evaluation

## 2018-05-12 ENCOUNTER — Ambulatory Visit (HOSPITAL_COMMUNITY): Payer: PPO | Admitting: Physician Assistant

## 2018-05-12 ENCOUNTER — Encounter (HOSPITAL_COMMUNITY): Payer: Self-pay | Admitting: Emergency Medicine

## 2018-05-12 ENCOUNTER — Other Ambulatory Visit: Payer: Self-pay

## 2018-05-12 ENCOUNTER — Observation Stay (HOSPITAL_COMMUNITY)
Admission: RE | Admit: 2018-05-12 | Discharge: 2018-05-13 | Disposition: A | Discharge status: Home / Self Care | Payer: PPO | Source: Ambulatory Visit | Attending: Surgery | Admitting: Surgery

## 2018-05-12 ENCOUNTER — Ambulatory Visit (HOSPITAL_COMMUNITY): Payer: PPO | Admitting: Anesthesiology

## 2018-05-12 ENCOUNTER — Encounter (HOSPITAL_COMMUNITY)
Admission: RE | Disposition: A | Discharge status: Home / Self Care | Payer: Self-pay | Source: Ambulatory Visit | Attending: Surgery

## 2018-05-12 DIAGNOSIS — Z7982 Long term (current) use of aspirin: Secondary | ICD-10-CM | POA: Diagnosis not present

## 2018-05-12 DIAGNOSIS — Z79899 Other long term (current) drug therapy: Secondary | ICD-10-CM | POA: Insufficient documentation

## 2018-05-12 DIAGNOSIS — Z9049 Acquired absence of other specified parts of digestive tract: Secondary | ICD-10-CM | POA: Insufficient documentation

## 2018-05-12 DIAGNOSIS — E1165 Type 2 diabetes mellitus with hyperglycemia: Secondary | ICD-10-CM | POA: Diagnosis present

## 2018-05-12 DIAGNOSIS — E119 Type 2 diabetes mellitus without complications: Secondary | ICD-10-CM | POA: Insufficient documentation

## 2018-05-12 DIAGNOSIS — Z8719 Personal history of other diseases of the digestive system: Secondary | ICD-10-CM | POA: Insufficient documentation

## 2018-05-12 DIAGNOSIS — Z791 Long term (current) use of non-steroidal anti-inflammatories (NSAID): Secondary | ICD-10-CM | POA: Diagnosis not present

## 2018-05-12 DIAGNOSIS — Z9079 Acquired absence of other genital organ(s): Secondary | ICD-10-CM | POA: Insufficient documentation

## 2018-05-12 DIAGNOSIS — Z8546 Personal history of malignant neoplasm of prostate: Secondary | ICD-10-CM | POA: Insufficient documentation

## 2018-05-12 DIAGNOSIS — K430 Incisional hernia with obstruction, without gangrene: Principal | ICD-10-CM | POA: Insufficient documentation

## 2018-05-12 DIAGNOSIS — IMO0002 Reserved for concepts with insufficient information to code with codable children: Secondary | ICD-10-CM | POA: Diagnosis present

## 2018-05-12 DIAGNOSIS — M6208 Separation of muscle (nontraumatic), other site: Secondary | ICD-10-CM | POA: Diagnosis not present

## 2018-05-12 DIAGNOSIS — K432 Incisional hernia without obstruction or gangrene: Secondary | ICD-10-CM | POA: Diagnosis not present

## 2018-05-12 DIAGNOSIS — D649 Anemia, unspecified: Secondary | ICD-10-CM | POA: Diagnosis not present

## 2018-05-12 DIAGNOSIS — K5790 Diverticulosis of intestine, part unspecified, without perforation or abscess without bleeding: Secondary | ICD-10-CM | POA: Diagnosis present

## 2018-05-12 DIAGNOSIS — K5732 Diverticulitis of large intestine without perforation or abscess without bleeding: Secondary | ICD-10-CM | POA: Diagnosis present

## 2018-05-12 HISTORY — PX: INSERTION OF MESH: SHX5868

## 2018-05-12 HISTORY — DX: Diverticulitis of large intestine with perforation and abscess without bleeding: K57.20

## 2018-05-12 HISTORY — PX: VENTRAL HERNIA REPAIR: SHX424

## 2018-05-12 LAB — GLUCOSE, CAPILLARY
Glucose-Capillary: 297 mg/dL — ABNORMAL HIGH (ref 70–99)
Glucose-Capillary: 261 mg/dL — ABNORMAL HIGH (ref 70–99)
Glucose-Capillary: 245 mg/dL — ABNORMAL HIGH (ref 70–99)
Glucose-Capillary: 288 mg/dL — ABNORMAL HIGH (ref 70–99)
Glucose-Capillary: 277 mg/dL — ABNORMAL HIGH (ref 70–99)

## 2018-05-12 SURGERY — REPAIR, HERNIA, VENTRAL, LAPAROSCOPIC
Anesthesia: General | Site: Abdomen

## 2018-05-12 MED ORDER — LACTATED RINGERS IV BOLUS: 1000.0000 mL | Freq: Three times a day (TID) | INTRAVENOUS | Status: DC | PRN

## 2018-05-12 MED ORDER — EPHEDRINE SULFATE-NACL 50-0.9 MG/10ML-% IV SOSY
PREFILLED_SYRINGE | INTRAVENOUS | Status: DC | PRN
  Administered 2018-05-12 (×2): 10 mg via INTRAVENOUS

## 2018-05-12 MED ORDER — CEFAZOLIN SODIUM-DEXTROSE 2-4 GM/100ML-% IV SOLN
2.0000 g | Freq: Three times a day (TID) | INTRAVENOUS | Status: AC
  Administered 2018-05-12: 2 g via INTRAVENOUS
  Filled 2018-05-12: qty 100

## 2018-05-12 MED ORDER — ACETAMINOPHEN 500 MG PO TABS
1000.0000 mg | ORAL_TABLET | Freq: Three times a day (TID) | ORAL | Status: DC
  Administered 2018-05-12 – 2018-05-13 (×3): 1000 mg via ORAL
  Filled 2018-05-12 (×3): qty 2

## 2018-05-12 MED ORDER — DIPHENHYDRAMINE HCL 12.5 MG/5ML PO ELIX: 12.5000 mg | ORAL_SOLUTION | Freq: Four times a day (QID) | ORAL | Status: DC | PRN

## 2018-05-12 MED ORDER — CALCIUM CARBONATE 600 MG PO TABS: 600.0000 mg | ORAL_TABLET | Freq: Every day | ORAL | Status: DC

## 2018-05-12 MED ORDER — HYDRALAZINE HCL 20 MG/ML IJ SOLN
INTRAMUSCULAR | Status: AC
  Filled 2018-05-12: qty 1

## 2018-05-12 MED ORDER — PROPOFOL 10 MG/ML IV BOLUS
INTRAVENOUS | Status: DC | PRN
  Administered 2018-05-12: 150 mg via INTRAVENOUS

## 2018-05-12 MED ORDER — MAGIC MOUTHWASH
15.0000 mL | Freq: Four times a day (QID) | ORAL | Status: DC | PRN
  Filled 2018-05-12: qty 15

## 2018-05-12 MED ORDER — DEXAMETHASONE SODIUM PHOSPHATE 10 MG/ML IJ SOLN
INTRAMUSCULAR | Status: DC | PRN
  Administered 2018-05-12: 8 mg via INTRAVENOUS

## 2018-05-12 MED ORDER — BUPIVACAINE-EPINEPHRINE (PF) 0.25% -1:200000 IJ SOLN
INTRAMUSCULAR | Status: AC
  Filled 2018-05-12: qty 60

## 2018-05-12 MED ORDER — LIDOCAINE HCL 2 % IJ SOLN
INTRAMUSCULAR | Status: AC
  Filled 2018-05-12: qty 20

## 2018-05-12 MED ORDER — ROCURONIUM BROMIDE 100 MG/10ML IV SOLN
INTRAVENOUS | Status: AC
  Filled 2018-05-12: qty 1

## 2018-05-12 MED ORDER — MEPERIDINE HCL 50 MG/ML IJ SOLN: 6.2500 mg | INTRAMUSCULAR | Status: DC | PRN

## 2018-05-12 MED ORDER — MELOXICAM 15 MG PO TABS
15.0000 mg | ORAL_TABLET | Freq: Every day | ORAL | Status: DC
  Administered 2018-05-12 – 2018-05-13 (×2): 15 mg via ORAL
  Filled 2018-05-12 (×2): qty 1

## 2018-05-12 MED ORDER — PROCHLORPERAZINE EDISYLATE 10 MG/2ML IJ SOLN: 5.0000 mg | Freq: Four times a day (QID) | INTRAMUSCULAR | Status: DC | PRN

## 2018-05-12 MED ORDER — ENOXAPARIN SODIUM 40 MG/0.4ML ~~LOC~~ SOLN
40.0000 mg | SUBCUTANEOUS | Status: DC
  Administered 2018-05-13: 40 mg via SUBCUTANEOUS
  Filled 2018-05-12: qty 0.4

## 2018-05-12 MED ORDER — METHOCARBAMOL 500 MG PO TABS: 1000.0000 mg | ORAL_TABLET | Freq: Four times a day (QID) | ORAL | Status: DC | PRN

## 2018-05-12 MED ORDER — LIP MEDEX EX OINT
1.0000 "application " | TOPICAL_OINTMENT | Freq: Two times a day (BID) | CUTANEOUS | Status: DC
  Administered 2018-05-12 – 2018-05-13 (×3): 1 via TOPICAL
  Filled 2018-05-12 (×2): qty 7

## 2018-05-12 MED ORDER — INSULIN ASPART 100 UNIT/ML ~~LOC~~ SOLN
0.0000 [IU] | Freq: Three times a day (TID) | SUBCUTANEOUS | Status: DC
  Administered 2018-05-12: 8 [IU] via SUBCUTANEOUS
  Administered 2018-05-13: 3 [IU] via SUBCUTANEOUS

## 2018-05-12 MED ORDER — PSYLLIUM 0.52 G PO CAPS: 1.0400 g | ORAL_CAPSULE | Freq: Every day | ORAL | Status: DC

## 2018-05-12 MED ORDER — SODIUM CHLORIDE 0.9% FLUSH
3.0000 mL | Freq: Two times a day (BID) | INTRAVENOUS | Status: DC
  Administered 2018-05-12 (×2): 3 mL via INTRAVENOUS

## 2018-05-12 MED ORDER — SODIUM CHLORIDE (PF) 0.9 % IJ SOLN
INTRAMUSCULAR | Status: AC
  Filled 2018-05-12: qty 10

## 2018-05-12 MED ORDER — ENALAPRILAT 1.25 MG/ML IV SOLN
0.6250 mg | Freq: Four times a day (QID) | INTRAVENOUS | Status: DC | PRN
  Filled 2018-05-12: qty 1

## 2018-05-12 MED ORDER — ADULT MULTIVITAMIN W/MINERALS CH
1.0000 | ORAL_TABLET | Freq: Every day | ORAL | Status: DC
  Administered 2018-05-13: 1 via ORAL
  Filled 2018-05-12: qty 1

## 2018-05-12 MED ORDER — INSULIN ASPART 100 UNIT/ML ~~LOC~~ SOLN
SUBCUTANEOUS | Status: AC
  Filled 2018-05-12: qty 1

## 2018-05-12 MED ORDER — SODIUM CHLORIDE 0.9 % IR SOLN
Status: DC | PRN
  Administered 2018-05-12: 1000 mL

## 2018-05-12 MED ORDER — ASPIRIN EC 81 MG PO TBEC
81.0000 mg | DELAYED_RELEASE_TABLET | Freq: Every day | ORAL | Status: DC
  Administered 2018-05-13: 81 mg via ORAL
  Filled 2018-05-12: qty 1

## 2018-05-12 MED ORDER — METHOCARBAMOL 1000 MG/10ML IJ SOLN
1000.0000 mg | Freq: Four times a day (QID) | INTRAVENOUS | Status: DC | PRN
  Filled 2018-05-12: qty 10

## 2018-05-12 MED ORDER — MENTHOL 3 MG MT LOZG: 1.0000 | LOZENGE | OROMUCOSAL | Status: DC | PRN

## 2018-05-12 MED ORDER — ACETAMINOPHEN 500 MG PO TABS
1000.0000 mg | ORAL_TABLET | ORAL | Status: AC
  Administered 2018-05-12: 1000 mg via ORAL
  Filled 2018-05-12: qty 2

## 2018-05-12 MED ORDER — FENTANYL CITRATE (PF) 100 MCG/2ML IJ SOLN: 25.0000 ug | INTRAMUSCULAR | Status: DC | PRN

## 2018-05-12 MED ORDER — INSULIN ASPART 100 UNIT/ML ~~LOC~~ SOLN
0.0000 [IU] | Freq: Every day | SUBCUTANEOUS | Status: DC
  Administered 2018-05-12: 3 [IU] via SUBCUTANEOUS

## 2018-05-12 MED ORDER — DEXAMETHASONE SODIUM PHOSPHATE 10 MG/ML IJ SOLN
INTRAMUSCULAR | Status: AC
  Filled 2018-05-12: qty 1

## 2018-05-12 MED ORDER — ACETAMINOPHEN 325 MG PO TABS: 325.0000 mg | ORAL_TABLET | Freq: Once | ORAL | Status: DC

## 2018-05-12 MED ORDER — ONDANSETRON HCL 4 MG/2ML IJ SOLN
INTRAMUSCULAR | Status: AC
  Filled 2018-05-12: qty 2

## 2018-05-12 MED ORDER — GUAIFENESIN-DM 100-10 MG/5ML PO SYRP: 10.0000 mL | ORAL_SOLUTION | ORAL | Status: DC | PRN

## 2018-05-12 MED ORDER — LACTATED RINGERS IV SOLN: INTRAVENOUS | Status: DC

## 2018-05-12 MED ORDER — BISACODYL 10 MG RE SUPP: 10.0000 mg | Freq: Every day | RECTAL | Status: DC | PRN

## 2018-05-12 MED ORDER — ROCURONIUM BROMIDE 10 MG/ML (PF) SYRINGE
PREFILLED_SYRINGE | INTRAVENOUS | Status: DC | PRN
  Administered 2018-05-12: 40 mg via INTRAVENOUS
  Administered 2018-05-12 (×2): 10 mg via INTRAVENOUS

## 2018-05-12 MED ORDER — PSYLLIUM 95 % PO PACK
1.0000 | PACK | Freq: Every day | ORAL | Status: DC
  Administered 2018-05-13: 1 via ORAL
  Filled 2018-05-12: qty 1

## 2018-05-12 MED ORDER — FENTANYL CITRATE (PF) 250 MCG/5ML IJ SOLN
INTRAMUSCULAR | Status: AC
  Filled 2018-05-12: qty 5

## 2018-05-12 MED ORDER — LIDOCAINE 2% (20 MG/ML) 5 ML SYRINGE
INTRAMUSCULAR | Status: AC
  Filled 2018-05-12: qty 10

## 2018-05-12 MED ORDER — PROPOFOL 10 MG/ML IV BOLUS
INTRAVENOUS | Status: AC
  Filled 2018-05-12: qty 20

## 2018-05-12 MED ORDER — HYDROCORTISONE 1 % EX CREA
1.0000 "application " | TOPICAL_CREAM | Freq: Three times a day (TID) | CUTANEOUS | Status: DC | PRN
  Filled 2018-05-12: qty 28

## 2018-05-12 MED ORDER — SODIUM CHLORIDE 0.9% FLUSH: 3.0000 mL | INTRAVENOUS | Status: DC | PRN

## 2018-05-12 MED ORDER — PROMETHAZINE HCL 25 MG/ML IJ SOLN: 6.2500 mg | INTRAMUSCULAR | Status: DC | PRN

## 2018-05-12 MED ORDER — INSULIN ASPART 100 UNIT/ML ~~LOC~~ SOLN
5.0000 [IU] | Freq: Once | SUBCUTANEOUS | Status: AC
  Administered 2018-05-12: 5 [IU] via SUBCUTANEOUS

## 2018-05-12 MED ORDER — BUPIVACAINE LIPOSOME 1.3 % IJ SUSP
INTRAMUSCULAR | Status: DC | PRN
  Administered 2018-05-12: 20 mL

## 2018-05-12 MED ORDER — SODIUM CHLORIDE 0.9 % IV SOLN: 250.0000 mL | INTRAVENOUS | Status: DC | PRN

## 2018-05-12 MED ORDER — GABAPENTIN 100 MG PO CAPS: 200.0000 mg | ORAL_CAPSULE | Freq: Three times a day (TID) | ORAL | 2 refills | Status: AC | PRN

## 2018-05-12 MED ORDER — ACETAMINOPHEN 160 MG/5ML PO SOLN: 325.0000 mg | Freq: Once | ORAL | Status: DC

## 2018-05-12 MED ORDER — LACTATED RINGERS IV SOLN
INTRAVENOUS | Status: DC
  Administered 2018-05-12 (×2): via INTRAVENOUS

## 2018-05-12 MED ORDER — OXYCODONE HCL 5 MG PO TABS: 5.0000 mg | ORAL_TABLET | ORAL | Status: DC | PRN

## 2018-05-12 MED ORDER — ONDANSETRON HCL 4 MG/2ML IJ SOLN: 4.0000 mg | Freq: Four times a day (QID) | INTRAMUSCULAR | Status: DC | PRN

## 2018-05-12 MED ORDER — LACTATED RINGERS IV SOLN: 1000.0000 mL | Freq: Three times a day (TID) | INTRAVENOUS | Status: DC | PRN

## 2018-05-12 MED ORDER — SIMETHICONE 80 MG PO CHEW: 40.0000 mg | CHEWABLE_TABLET | Freq: Four times a day (QID) | ORAL | Status: DC | PRN

## 2018-05-12 MED ORDER — CALCIUM CARBONATE 1250 (500 CA) MG PO TABS
1.0000 | ORAL_TABLET | Freq: Every day | ORAL | Status: DC
  Administered 2018-05-13: 500 mg via ORAL
  Filled 2018-05-12: qty 1

## 2018-05-12 MED ORDER — ALUM & MAG HYDROXIDE-SIMETH 200-200-20 MG/5ML PO SUSP: 30.0000 mL | Freq: Four times a day (QID) | ORAL | Status: DC | PRN

## 2018-05-12 MED ORDER — SODIUM CHLORIDE 0.9 % IV SOLN
INTRAVENOUS | Status: DC
  Administered 2018-05-12 – 2018-05-13 (×2): via INTRAVENOUS

## 2018-05-12 MED ORDER — ONDANSETRON HCL 4 MG/2ML IJ SOLN
INTRAMUSCULAR | Status: DC | PRN
  Administered 2018-05-12: 4 mg via INTRAVENOUS

## 2018-05-12 MED ORDER — ONDANSETRON 4 MG PO TBDP: 4.0000 mg | ORAL_TABLET | Freq: Four times a day (QID) | ORAL | Status: DC | PRN

## 2018-05-12 MED ORDER — GABAPENTIN 300 MG PO CAPS
300.0000 mg | ORAL_CAPSULE | Freq: Two times a day (BID) | ORAL | Status: DC
  Administered 2018-05-12 – 2018-05-13 (×2): 300 mg via ORAL
  Filled 2018-05-12 (×2): qty 1

## 2018-05-12 MED ORDER — HYDROCORTISONE 2.5 % RE CREA
1.0000 "application " | TOPICAL_CREAM | Freq: Four times a day (QID) | RECTAL | Status: DC | PRN
  Filled 2018-05-12: qty 28.35

## 2018-05-12 MED ORDER — DIPHENHYDRAMINE HCL 50 MG/ML IJ SOLN: 12.5000 mg | Freq: Four times a day (QID) | INTRAMUSCULAR | Status: DC | PRN

## 2018-05-12 MED ORDER — METOPROLOL TARTRATE 5 MG/5ML IV SOLN: 5.0000 mg | Freq: Four times a day (QID) | INTRAVENOUS | Status: DC | PRN

## 2018-05-12 MED ORDER — PHENYLEPHRINE 40 MCG/ML (10ML) SYRINGE FOR IV PUSH (FOR BLOOD PRESSURE SUPPORT)
PREFILLED_SYRINGE | INTRAVENOUS | Status: DC | PRN
  Administered 2018-05-12: 20 ug via INTRAVENOUS
  Administered 2018-05-12 (×2): 80 ug via INTRAVENOUS

## 2018-05-12 MED ORDER — ACETAMINOPHEN 10 MG/ML IV SOLN: 1000.0000 mg | Freq: Once | INTRAVENOUS | Status: DC | PRN

## 2018-05-12 MED ORDER — HYDRALAZINE HCL 20 MG/ML IJ SOLN
INTRAMUSCULAR | Status: DC | PRN
  Administered 2018-05-12 (×2): 5 mg via INTRAVENOUS

## 2018-05-12 MED ORDER — SUGAMMADEX SODIUM 200 MG/2ML IV SOLN
INTRAVENOUS | Status: DC | PRN
  Administered 2018-05-12: 200 mg via INTRAVENOUS

## 2018-05-12 MED ORDER — BUPIVACAINE-EPINEPHRINE 0.25% -1:200000 IJ SOLN
INTRAMUSCULAR | Status: DC | PRN
  Administered 2018-05-12: 60 mL

## 2018-05-12 MED ORDER — GABAPENTIN 300 MG PO CAPS
300.0000 mg | ORAL_CAPSULE | ORAL | Status: AC
  Administered 2018-05-12: 300 mg via ORAL
  Filled 2018-05-12: qty 1

## 2018-05-12 MED ORDER — TRAMADOL HCL 50 MG PO TABS: 50.0000 mg | ORAL_TABLET | Freq: Four times a day (QID) | ORAL | 0 refills | Status: DC | PRN

## 2018-05-12 MED ORDER — LABETALOL HCL 5 MG/ML IV SOLN
INTRAVENOUS | Status: DC | PRN
  Administered 2018-05-12: 5 mg via INTRAVENOUS

## 2018-05-12 MED ORDER — POLYETHYLENE GLYCOL 3350 17 G PO PACK: 17.0000 g | PACK | Freq: Every day | ORAL | Status: DC | PRN

## 2018-05-12 MED ORDER — FENTANYL CITRATE (PF) 250 MCG/5ML IJ SOLN
INTRAMUSCULAR | Status: DC | PRN
  Administered 2018-05-12 (×3): 50 ug via INTRAVENOUS
  Administered 2018-05-12: 100 ug via INTRAVENOUS

## 2018-05-12 MED ORDER — HYDROMORPHONE HCL 1 MG/ML IJ SOLN: 0.5000 mg | INTRAMUSCULAR | Status: DC | PRN

## 2018-05-12 MED ORDER — PHENOL 1.4 % MT LIQD
1.0000 | OROMUCOSAL | Status: DC | PRN
  Filled 2018-05-12: qty 177

## 2018-05-12 MED ORDER — LIDOCAINE 2% (20 MG/ML) 5 ML SYRINGE
INTRAMUSCULAR | Status: DC | PRN
  Administered 2018-05-12: 1.5 mg/kg/h via INTRAVENOUS

## 2018-05-12 MED ORDER — LIDOCAINE 2% (20 MG/ML) 5 ML SYRINGE
INTRAMUSCULAR | Status: DC | PRN
  Administered 2018-05-12: 60 mg via INTRAVENOUS

## 2018-05-12 MED ORDER — CEFAZOLIN SODIUM-DEXTROSE 2-4 GM/100ML-% IV SOLN
2.0000 g | INTRAVENOUS | Status: AC
  Administered 2018-05-12: 2 g via INTRAVENOUS
  Filled 2018-05-12: qty 100

## 2018-05-12 MED ORDER — HYDRALAZINE HCL 20 MG/ML IJ SOLN: 5.0000 mg | INTRAMUSCULAR | Status: DC | PRN

## 2018-05-12 MED ORDER — PROCHLORPERAZINE MALEATE 10 MG PO TABS: 10.0000 mg | ORAL_TABLET | Freq: Four times a day (QID) | ORAL | Status: DC | PRN

## 2018-05-12 SURGICAL SUPPLY — 44 items
APPLIER CLIP 5 13 M/L LIGAMAX5 (MISCELLANEOUS)
BINDER ABDOMINAL 12 ML 46-62 (SOFTGOODS) ×2 IMPLANT
CABLE HIGH FREQUENCY MONO STRZ (ELECTRODE) ×2 IMPLANT
CHLORAPREP W/TINT 26ML (MISCELLANEOUS) ×2 IMPLANT
CLIP APPLIE 5 13 M/L LIGAMAX5 (MISCELLANEOUS) IMPLANT
COVER SURGICAL LIGHT HANDLE (MISCELLANEOUS) ×2 IMPLANT
COVER WAND RF STERILE (DRAPES) ×2 IMPLANT
DECANTER SPIKE VIAL GLASS SM (MISCELLANEOUS) IMPLANT
DEVICE SECURE STRAP 25 ABSORB (INSTRUMENTS) ×2 IMPLANT
DEVICE TROCAR PUNCTURE CLOSURE (ENDOMECHANICALS) ×2 IMPLANT
DRAIN CHANNEL 15F RND FF 3/16 (WOUND CARE) ×2 IMPLANT
DRAPE WARM FLUID 44X44 (DRAPE) ×2 IMPLANT
DRSG OPSITE POSTOP 4X6 (GAUZE/BANDAGES/DRESSINGS) ×2 IMPLANT
DRSG TEGADERM 2-3/8X2-3/4 SM (GAUZE/BANDAGES/DRESSINGS) ×2 IMPLANT
DRSG TEGADERM 4X4.75 (GAUZE/BANDAGES/DRESSINGS) ×2 IMPLANT
ELECT PENCIL ROCKER SW 15FT (MISCELLANEOUS) ×2 IMPLANT
ELECT REM PT RETURN 15FT ADLT (MISCELLANEOUS) ×2 IMPLANT
EVACUATOR SILICONE 100CC (DRAIN) ×2 IMPLANT
GAUZE SPONGE 2X2 8PLY STRL LF (GAUZE/BANDAGES/DRESSINGS) ×1 IMPLANT
GLOVE ECLIPSE 8.0 STRL XLNG CF (GLOVE) ×2 IMPLANT
GLOVE INDICATOR 8.0 STRL GRN (GLOVE) ×2 IMPLANT
GOWN STRL REUS W/TWL XL LVL3 (GOWN DISPOSABLE) ×4 IMPLANT
IRRIG SUCT STRYKERFLOW 2 WTIP (MISCELLANEOUS)
IRRIGATION SUCT STRKRFLW 2 WTP (MISCELLANEOUS) IMPLANT
KIT BASIN OR (CUSTOM PROCEDURE TRAY) ×2 IMPLANT
MARKER SKIN DUAL TIP RULER LAB (MISCELLANEOUS) ×2 IMPLANT
MESH VENTRALIGHT ST 10X13IN (Mesh General) ×2 IMPLANT
NEEDLE SPNL 22GX3.5 QUINCKE BK (NEEDLE) IMPLANT
PAD POSITIONING PINK XL (MISCELLANEOUS) ×2 IMPLANT
SCISSORS LAP 5X35 DISP (ENDOMECHANICALS) ×2 IMPLANT
SET TUBE SMOKE EVAC HIGH FLOW (TUBING) ×2 IMPLANT
SLEEVE ADV FIXATION 5X100MM (TROCAR) ×2 IMPLANT
SPONGE GAUZE 2X2 STER 10/PKG (GAUZE/BANDAGES/DRESSINGS) ×1
STRIP CLOSURE SKIN 1/2X4 (GAUZE/BANDAGES/DRESSINGS) ×2 IMPLANT
SUT MNCRL AB 4-0 PS2 18 (SUTURE) ×2 IMPLANT
SUT PDS AB 1 CT1 27 (SUTURE) ×6 IMPLANT
SUT PROLENE 1 CT 1 30 (SUTURE) ×16 IMPLANT
SUT PROLENE 2 0 BLUE (SUTURE) ×2 IMPLANT
SUT VIC AB 2-0 SH 18 (SUTURE) ×2 IMPLANT
TOWEL OR 17X26 10 PK STRL BLUE (TOWEL DISPOSABLE) ×2 IMPLANT
TRAY LAPAROSCOPIC (CUSTOM PROCEDURE TRAY) ×2 IMPLANT
TROCAR ADV FIXATION 11X100MM (TROCAR) IMPLANT
TROCAR ADV FIXATION 5X100MM (TROCAR) ×2 IMPLANT
TROCAR BLADELESS OPT 5 100 (ENDOMECHANICALS) ×2 IMPLANT

## 2018-05-12 NOTE — Anesthesia Procedure Notes (Signed)
Procedure Name: Intubation Date/Time: 05/12/2018 7:38 AM Performed by: Mitzie Na, CRNA Pre-anesthesia Checklist: Patient identified, Emergency Drugs available, Suction available, Patient being monitored and Timeout performed Patient Re-evaluated:Patient Re-evaluated prior to induction Oxygen Delivery Method: Circle system utilized Preoxygenation: Pre-oxygenation with 100% oxygen Induction Type: IV induction Ventilation: Mask ventilation without difficulty and Oral airway inserted - appropriate to patient size Laryngoscope Size: Mac and 4 Grade View: Grade II Tube type: Oral Tube size: 7.5 mm Number of attempts: 1 Airway Equipment and Method: Stylet Placement Confirmation: ETT inserted through vocal cords under direct vision,  positive ETCO2 and breath sounds checked- equal and bilateral Secured at: 25 cm Tube secured with: Tape Dental Injury: Teeth and Oropharynx as per pre-operative assessment

## 2018-05-12 NOTE — Transfer of Care (Signed)
Immediate Anesthesia Transfer of Care Note  Patient: Billy Horn  Procedure(s) Performed: LAPAROSCOPIC  COMPARTMENT SEPARATION INCISIONAL HERNIA WITH MESH (N/A Abdomen) INSERTION OF MESH (N/A Abdomen)  Patient Location: PACU  Anesthesia Type:General  Level of Consciousness: awake, alert , oriented and patient cooperative  Airway & Oxygen Therapy: Patient Spontanous Breathing and Patient connected to face mask oxygen  Post-op Assessment: Report given to RN, Post -op Vital signs reviewed and stable and Patient moving all extremities  Post vital signs: Reviewed and stable  Last Vitals:  Vitals Value Taken Time  BP 144/74 05/12/2018 10:30 AM  Temp    Pulse 76 05/12/2018 10:30 AM  Resp 14 05/12/2018 10:30 AM  SpO2 96 % 05/12/2018 10:30 AM  Vitals shown include unvalidated device data.  Last Pain:  Vitals:   05/12/18 0611  TempSrc:   PainSc: 0-No pain      Patients Stated Pain Goal: 4 (91/63/84 [id number])  Complications: No apparent anesthesia complications

## 2018-05-12 NOTE — H&P (Signed)
Billy Horn DOB: 01-16-1946 Single / Language: [name] / Race: White Male  Patient Care Team: Alroy Dust, L.Marlou Sa, MD as PCP - General (Family Medicine) Leighton Ruff, MD as Consulting Physician (Colon and Rectal Surgery) Michael Boston, MD as Consulting Physician (General Surgery) Raynelle Bring, MD as Consulting Physician (Urology)  ` ` Patient sent for surgical consultation at the request of Dr Leighton Ruff  Chief Complaint: Incisional hernia ` ` The patient is a pleasant active male. History of prostate cancer status post robotic prostatectomy by Dr. Alinda Money. cancer free so far.Had supraumbilical incisional hernia. Developed Severe Diverticulitis Requiring Colectomy. Had Postoperative Liver Abscess.eventually he recovered from all that but did develop a recurrent incisional hernia. Did not particular bother him last year. However he has noticed its Larger and is causing some swelling and occasional discomfort when he is active. He does a lot of yard work and after moving several 100 bags of mulch became much more parenting concerning. He discussed with his colorectal surgeon. Dr. Marcello Moores recommended consideration of repair. Patient referred to me for evaluation.  Patient does not smoke. I believe he is a borderline diabetic but has been able to control things with exercise and weight loss. Not on any medication. Moving his bowels regularly. No recurrent diverticulitis flares. no evidence of any recurrent prostate cancer.He can walk several miles without difficulty. No recurrent infections.  (Review of systems as stated in this history (HPI) or in the review of systems. Otherwise all other 12 point ROS are negative) ` ` `   Allergies (Jacqueline Haggett, RMA; 02/15/2018 9:45 AM) No Known Allergies [12/01/2016]: Allergies Reconciled   Medication History [employer], RMA; 02/15/2018 9:45 AM) Roma Schanz (In Vitro) Active. Meloxicam (15MG   Tablet, Oral) Active. Aspirin (81MG  Tablet Chewable, Oral) Active. Iron (325 (65 Fe)MG Tablet, Oral) Active. Metamucil (0.52GM Capsule, Oral) Active. Calcium Carbonate-Iron (Oral) Specific strength unknown - Active. Multivitamin Adults (Oral) Active. Medications Reconciled  Vitals (Jacqueline Haggett RMA; 02/15/2018 9:46 AM) 02/15/2018 9:45 AM Weight: 197 lb Height: 72in Body Surface Area: 2.12 m Body Mass Index: 26.72 kg/m  Temp.: 97.66F(Temporal)  Pulse: 86 (Regular)  P.OX: 96% (Room air) BP: 158/70 (Sitting, Left Arm, Standard)       Physical Exam [name] MD; 02/16/2018 7:11 AM) General Mental Status-Alert. General Appearance-Not in acute distress, Not Sickly. Orientation-Oriented X3. Hydration-Well hydrated. Voice-Normal.  Integumentary Global Assessment Upon inspection and palpation of skin surfaces of the - Axillae: non-tender, no inflammation or ulceration, no drainage. and Distribution of scalp and body hair is normal. General Characteristics Temperature - normal warmth is noted.  Head and Neck Head-normocephalic, atraumatic with no lesions or palpable masses. Face Global Assessment - atraumatic, no absence of expression. Neck Global Assessment - no abnormal movements, no bruit auscultated on the right, no bruit auscultated on the left, no decreased range of motion, non-tender. Trachea-midline. Thyroid Gland Characteristics - non-tender.  Eye Eyeball - Left-Extraocular movements intact, No Nystagmus. Eyeball - Right-Extraocular movements intact, No Nystagmus. Cornea - Left-No Hazy. Cornea - Right-No Hazy. Sclera/Conjunctiva - Left-No scleral icterus, No Discharge. Sclera/Conjunctiva - Right-No scleral icterus, No Discharge. Pupil - Left-Direct reaction to light normal. Pupil - Right-Direct reaction to light normal.  ENMT Ears Pinna - Left - no drainage observed, no generalized  tenderness observed. Right - no drainage observed, no generalized tenderness observed. Nose and Sinuses External Inspection of the Nose - no destructive lesion observed. Inspection of the nares - Left - quiet respiration. Right - quiet respiration. Mouth and Throat Lips -  Upper Lip - no fissures observed, no pallor noted. Lower Lip - no fissures observed, no pallor noted. Nasopharynx - no discharge present. Oral Cavity/Oropharynx - Tongue - no dryness observed. Oral Mucosa - no cyanosis observed. Hypopharynx - no evidence of airway distress observed.  Chest and Lung Exam Inspection Movements - Normal and Symmetrical. Accessory muscles - No use of accessory muscles in breathing. Palpation Palpation of the chest reveals - Non-tender. Auscultation Breath sounds - Normal and Clear.  Cardiovascular Auscultation Rhythm - Regular. Murmurs & Other Heart Sounds - Auscultation of the heart reveals - No Murmurs and No Systolic Clicks.  Abdomen Inspection Inspection of the abdomen reveals - No Visible peristalsis and No Abnormal pulsations. Umbilicus - No Bleeding, No Urine drainage. Palpation/Percussion Palpation and Percussion of the abdomen reveal - Soft, Non Tender, No Rebound tenderness, No Rigidity (guarding) and No Cutaneous hyperesthesia. Note: supraumbilical bulging consistent with recurrent hernia. 6 x 6 cm region reduces down to smaller fascial defect. Abdomen soft. Nontender. Not distended. No other umbilical or incisional hernias. No guarding.   Male Genitourinary Sexual Maturity Tanner 5 - Adult hair pattern and Adult penile size and shape.  Peripheral Vascular Upper Extremity Inspection - Left - No Cyanotic nailbeds, Not Ischemic. Right - No Cyanotic nailbeds, Not Ischemic.  Neurologic Neurologic evaluation reveals -normal attention span and ability to concentrate, able to name objects and repeat phrases. Appropriate fund of knowledge , normal sensation and normal  coordination. Mental Status Affect - not angry, not paranoid. Cranial Nerves-Normal Bilaterally. Gait-Normal.  Neuropsychiatric Mental status exam performed with findings of-able to articulate well with normal speech/language, rate, volume and coherence, thought content normal with ability to perform basic computations and apply abstract reasoning and no evidence of hallucinations, delusions, obsessions or homicidal/suicidal ideation.  Musculoskeletal Global Assessment Spine, Ribs and Pelvis - no instability, subluxation or laxity. Right Upper Extremity - no instability, subluxation or laxity.  Lymphatic Head & Neck  General Head & Neck Lymphatics: Bilateral - Description - No Localized lymphadenopathy. Axillary  General Axillary Region: Bilateral - Description - No Localized lymphadenopathy. Femoral & Inguinal  Generalized Femoral & Inguinal Lymphatics: Left - Description - No Localized lymphadenopathy. Right - Description - No Localized lymphadenopathy.    Assessment & Plan INCISIONAL HERNIA, WITHOUT OBSTRUCTION OR GANGRENE (K43.2) Impression: 73 year old male with history of robotic prostatectomy with incisional hernia. Diverticulitis requiring robotic colectomy and primary incisional hernia repair. Recurrence again supraumbilically.  I think he would benefit from repair. Underlay with mesh. He's been infection free for the past year. He is very active gentleman. Not a giant defect. Probably can be outpatient but may need to stay overnight. We will see.  It is not causing severe issues its time, so hopefully he waited after the holiday season.  The anatomy & physiology of the abdominal wall was discussed. The pathophysiology of hernias was discussed. Natural history risks without surgery including progeressive enlargement, pain, incarceration, & strangulation was discussed. Contributors to complications such as smoking, obesity, diabetes, prior surgery, etc  were discussed.  I feel the risks of no intervention will lead to serious problems that outweigh the operative risks; therefore, I recommended surgery to reduce and repair the hernia. I explained laparoscopic techniques with possible need for an open approach. I noted the probable use of mesh to patch and/or buttress the hernia repair  Risks such as bleeding, infection, abscess, need for further treatment, heart attack, death, and other risks were discussed. I noted a good likelihood this will help address  the problem. Goals of post-operative recovery were discussed as well. Possibility that this will not correct all symptoms was explained. I stressed the importance of low-impact activity, aggressive pain control, avoiding constipation, & not pushing through pain to minimize risk of post-operative chronic pain or injury. Possibility of reherniation especially with smoking, obesity, diabetes, immunosuppression, and other health conditions was discussed. We will work to minimize complications.  An educational handout further explaining the pathology & treatment options was given as well. Questions were answered. The patient expresses understanding & wishes to proceed with surgery.    [name], MD, FACS, MASCRS Gastrointestinal and Minimally Invasive Surgery    [address] [place], [place] [address] [phone] Main / Paging [phone] Fax

## 2018-05-12 NOTE — Interval H&P Note (Signed)
History and Physical Interval Note:  05/12/2018 6:01 AM  Billy Horn  has presented today for surgery, with the diagnosis of incisional recurrent abdominal wall hernia  The various methods of treatment have been discussed with the patient and family. After consideration of risks, benefits and other options for treatment, the patient has consented to  Procedure(s): LAPAROSCOPIC VENTRAL HERNIA WITH MESH (N/A) INSERTION OF MESH (N/A) as a surgical intervention .  The patient's history has been reviewed, patient examined, no change in status, stable for surgery.  I have reviewed the patient's chart and labs.  Questions were answered to the patient's satisfaction.    I have re-reviewed the the patient's records, history, medications, and allergies.  I have re-examined the patient.  I again discussed intraoperative plans and goals of post-operative recovery.  The patient agrees to proceed.  Billy Horn  [birth year] [id number]  Patient Care Team: Alroy Dust, Carlean Jews.Marlou Sa, MD as PCP - General (Family Medicine) Leighton Ruff, MD as Consulting Physician (Colon and Rectal Surgery) Michael Boston, MD as Consulting Physician (General Surgery) Raynelle Bring, MD as Consulting Physician (Urology)  Patient Active Problem List   Diagnosis Date Noted  . Diverticular disease 01/14/2017  . Colonic diverticular abscess 10/27/2016  . Diverticulitis with abscess s/p robotic sigmoid colectomy 01/14/2017 10/01/2016  . Liver abscess 09/28/2016  . Anemia 09/28/2016  . Diabetes mellitus type 2, uncontrolled ([place]) 07/27/2012  . Narcotic induced constipation 07/27/2012  . Tibial plateau fracture 07/27/2012    Past Medical History:  Diagnosis Date  . Anemia   . Cancer [facility]) 2012   prostate cancer  . Diabetes mellitus    controlled with diet and exercise  . Liver abscess   . Pneumonia    as a child    Past Surgical History:  Procedure Laterality Date  . COLONOSCOPY WITH PROPOFOL N/A 11/09/2016   Procedure:  COLONOSCOPY WITH PROPOFOL;  Surgeon: Laurence Spates, MD;  Location: [facility];  Service: Endoscopy;  Laterality: N/A;  . IR RADIOLOGIST EVAL & MGMT  11/04/2016  . ORIF TIBIA PLATEAU Left 07/23/2012   Procedure: OPEN REDUCTION INTERNAL FIXATION (ORIF) TIBIAL PLATEAU;  Surgeon: Sharmon Revere, MD;  Location: [facility];  Service: Orthopedics;  Laterality: Left;  . ROBOT ASSISTED LAPAROSCOPIC RADICAL PROSTATECTOMY  04/06/2011   Procedure: ROBOTIC ASSISTED LAPAROSCOPIC RADICAL PROSTATECTOMY LEVEL 2;  Surgeon: Dutch Gray, MD;  Location: [facility];  Service: Urology;  Laterality: N/A;  Bilateral Pelvic Lymphadenectomy   . TONSILLECTOMY     as a child    Social History   Socioeconomic History  . Marital status: Single    Spouse name: Not on file  . Number of children: Not on file  . Years of education: Not on file  . Highest education level: Not on file  Occupational History  . Occupation: Chief Executive Officer - real estate  Social Needs  . Financial resource strain: Not on file  . Food insecurity:    Worry: Not on file    Inability: Not on file  . Transportation needs:    Medical: Not on file    Non-medical: Not on file  Tobacco Use  . Smoking status: Never Smoker  . Smokeless tobacco: Never Used  Substance and Sexual Activity  . Alcohol use: Yes    Comment: occas wine  . Drug use: No  . Sexual activity: Not on file  Lifestyle  . Physical activity:    Days per week: Not on file    Minutes per session: Not on file  .  Stress: Not on file  Relationships  . Social connections:    Talks on phone: Not on file    Gets together: Not on file    Attends religious service: Not on file    Active member of club or organization: Not on file    Attends meetings of clubs or organizations: Not on file    Relationship status: Not on file  . Intimate partner violence:    Fear of current or ex partner: Not on file    Emotionally abused: Not on file    Physically abused: Not on file    Forced  sexual activity: Not on file  Other Topics Concern  . Not on file  Social History Narrative  . Not on file    Family History  Problem Relation Age of Onset  . Lung cancer Mother 7    Medications Prior to Admission  Medication Sig Dispense Refill Last Dose  . acetaminophen (TYLENOL) 325 MG tablet Take 1-2 tablets (325-650 mg total) by mouth every 4 (four) hours as needed.   Not Taking at Unknown time  . aspirin EC 81 MG tablet Take 81 mg by mouth daily after breakfast.    01/09/2017  . Calcium Carbonate (CALCIUM-CARB 600 PO) Take 600 mg by mouth daily after breakfast.    01/12/2017  . meloxicam (MOBIC) 15 MG tablet Take 15 mg by mouth daily.   01/12/2017  . Multiple Vitamin (MULTIVITAMIN WITH MINERALS) TABS Take 1 tablet by mouth daily after breakfast.    01/12/2017  . psyllium (METAMUCIL) 0.52 G capsule Take 1.04 g by mouth daily after breakfast.    01/09/2017  . docusate sodium (COLACE) 100 MG capsule Take 1 capsule (100 mg total) by mouth 2 (two) times daily. (Patient not taking: Reported on 04/26/2018) 60 capsule 2 Not Taking at Unknown time  . ferrous sulfate 325 (65 FE) MG tablet Take 1 tablet (325 mg total) by mouth 2 (two) times daily with a meal. For anemia (Patient not taking: Reported on 04/26/2018) 60 tablet 0 Not Taking at Unknown time    Current Facility-Administered Medications  Medication Dose Route Frequency Provider Last Rate Last Dose  . acetaminophen (TYLENOL) tablet 1,000 mg  1,000 mg Oral On Call to OR Michael Boston, MD      . bupivacaine liposome (EXPAREL) 1.3 % injection 266 mg  20 mL Infiltration On Call to OR Michael Boston, MD      . ceFAZolin (ANCEF) IVPB 2g/100 mL premix  2 g Intravenous On Call to OR Michael Boston, MD      . gabapentin (NEURONTIN) capsule 300 mg  300 mg Oral On Call to OR Michael Boston, MD         No Known Allergies  BP (!) 158/95   Pulse 68   Temp 97.8 F (36.6 C) (Oral)   Resp 16   SpO2 96%   Labs: No results found for this or any  previous visit (from the past 48 hour(s)).  Imaging / Studies: No results found.   [name], M.D., F.A.C.S. Gastrointestinal and Minimally Invasive Surgery [facility] [address] [place], [place] [address] [phone] Main / Paging  05/12/2018 6:01 AM    [name]

## 2018-05-12 NOTE — Anesthesia Postprocedure Evaluation (Signed)
Anesthesia Post Note  Patient: RENDELL THIVIERGE  Procedure(s) Performed: LAPAROSCOPIC  COMPARTMENT SEPARATION INCISIONAL HERNIA WITH MESH (N/A Abdomen) INSERTION OF MESH (N/A Abdomen)     Patient location during evaluation: PACU Anesthesia Type: General Level of consciousness: awake and alert Pain management: pain level controlled Vital Signs Assessment: post-procedure vital signs reviewed and stable Respiratory status: spontaneous breathing, nonlabored ventilation, respiratory function stable and patient connected to nasal cannula oxygen Cardiovascular status: blood pressure returned to baseline and stable Postop Assessment: no apparent nausea or vomiting Anesthetic complications: no    Last Vitals:  Vitals:   05/12/18 1206 05/12/18 1307  BP: (!) 142/76 (!) 150/80  Pulse: 78 70  Resp: 12 16  Temp: 37 C 36.4 C  SpO2: 94% 95%    Last Pain:  Vitals:   05/12/18 1307  TempSrc: Oral  PainSc:                  Effie Berkshire

## 2018-05-12 NOTE — Op Note (Signed)
05/12/2018  PATIENT:  Billy Horn  73 y.o. male  Patient Care Team: Alroy Dust, L.Marlou Sa, MD as PCP - General (Family Medicine) Leighton Ruff, MD as Consulting Physician (Colon and Rectal Surgery) Michael Boston, MD as Consulting Physician (General Surgery) Raynelle Bring, MD as Consulting Physician (Urology)  PRE-OPERATIVE DIAGNOSIS:  ncisional recurrent abdominal wall hernia  POST-OPERATIVE DIAGNOSIS:  Incisional recurrrent abdominal wall hernias  PROCEDURE:  LAPAROSCOPIC  COMPONENTR SEPARATION INCISIONAL HERNIA REPAIR WITH MESH INSERTION OF MESH  SURGEON:  [name], MD  ASSISTANT: Vicki Mallet, RNFA   ANESTHESIA:     General  Nerve block provided with liposomal bupivacaine (Experel) mixed with 0.25% bupivacaine as a Bilateral TAP block x30 mL at the level of the transverse abdominis & preperitoneal spaces along the flank at the anterior axillary line, from subcostal ridge to iliac crest under laparoscopic guidance    Local anesthesia field block: (0.25% bupivacaine & liposomal  Bupivacaine [Experel])  EBL:  Total I/O In: 1000 [I.V.:1000] Out: 100 [Blood:100]  Per anesthesia record  Delay start of Pharmacological VTE agent (>24hrs) due to surgical blood loss or risk of bleeding:  no  DRAINS: 15 [place] Blake drain goes from right upper chronic subcutaneous tissues into the deep subcutaneous her periumbilical hernia sac closure.  SPECIMEN:  No Specimen  DISPOSITION OF SPECIMEN:  N/A  COUNTS:  YES  PLAN OF CARE: Admit for overnight observation  PATIENT DISPOSITION:  PACU - hemodynamically stable.  INDICATION: Pleasant patient with prior prostatectomy as well as need for partial colectomy for recurrent diverticulitis.  Developed recurrent incisional hernia at the periumbilical region.  Recommendation was made for surgical repair:  The anatomy & physiology of the abdominal wall was discussed. The pathophysiology of hernias was discussed. Natural history risks  without surgery including progeressive enlargement, pain, incarceration & strangulation was discussed. Contributors to complications such as smoking, obesity, diabetes, prior surgery, etc were discussed.  I feel the risks of no intervention will lead to serious problems that outweigh the operative risks; therefore, I recommended surgery to reduce and repair the hernia. I explained laparoscopic techniques with possible need for an open approach. I noted the probable use of mesh to patch and/or buttress the hernia repair  Risks such as bleeding, infection, abscess, need for further treatment, heart attack, death, and other risks were discussed. I noted a good likelihood this will help address the problem. Goals of post-operative recovery were discussed as well. Possibility that this will not correct all symptoms was explained. I stressed the importance of low-impact activity, aggressive pain control, avoiding constipation, & not pushing through pain to minimize risk of post-operative chronic pain or injury. Possibility of reherniation especially with smoking, obesity, diabetes, immunosuppression, and other health conditions was discussed. We will work to minimize complications.  An educational handout further explaining the pathology & treatment options was given as well. Questions were answered. The patient expresses understanding & wishes to proceed with surgery.   OR FINDINGS: Moderate diastases recti with periumbilical incisional hernia 10 x 6 cm in size.  Incarcerated.  Type of repair: Laparoscopic underlay repair   Placement of mesh: Intraperitoneal underlay repair  Name of mesh: Bard Ventralight dual sided (polypropylene / Seprafilm)  Size of mesh: 33x27cm  Orientation: Transverse  Mesh overlap:  5-7cm   DESCRIPTION:   Informed consent was confirmed. The patient underwent general anaesthesia without difficulty. The patient was positioned appropriately. VTE prevention in place. The  patient's abdomen was clipped, prepped, & draped in a sterile fashion. Surgical  timeout confirmed our plan.  The patient was positioned in reverse Trendelenburg. Abdominal entry was gained using optical entry technique in the left upper abdomen. Entry was clean. I induced carbon dioxide insufflation. Camera inspection revealed no injury. Extra ports were carefully placed under direct laparoscopic visualization.   I could see the hernia on the parietal peritoneum under the abdominal wall.   I freed off the falciform ligament and central peritoneum to expose the retrorectus fascia   I use this to help reduce some of the hernia sac down.  Freed peritoneum off of most of the anterior abdominal wall I made sure hemostasis was good.  I mapped out the region using a needle passer.   To ensure that I would have at least 5 cm radial coverage outside of the hernia defect, I chose a 33x27cm dual sided mesh.  I placed #1 Prolene stitches around its edge about every 5 cm = 12 total.  The defect was brought her with some tension.  I did not think it would come together without tension.  I therefore decided to do component separation.  I made a vertical paramedian transections through the transversus abdominis fascia and muscle lateral to the inferior epigastrics on right and left side.  Did that from subcostal ridge to iliac crest.  This helped release and relaxed tension such that midline can come together much more easily.  I rolled the mesh & placed into the peritoneal cavity through the hernia defect.  I unrolled the mesh and positioned it appropriately.  I secured the mesh to cover up the hernia defect using a laparoscopic suture passer to pass the tails of the Prolene through the abdominal wall & tagged them with clamps for good transfascial suturing.  I started out in four corners to make sure I had the mesh centered under the hernia defect appropriately, and then proceeded to work in quadrants.  I used many of the  stitches to help re-tacked the peritoneum such that 50% of it was truly in the retrorectus space and not intraperitoneal, especially around almost the entire edge.  We evacuated CO2 & desufflated the abdomen.  I tied the fascial stitches down.  I opened up the skin of the hernia sac further.  I closed the fascial defect that I placed the mesh through using #1 PDS running sitches primarily.  I reinsufflated the abdomen. The mesh provided at least circumferential coverage around the entire region of hernia defects.  I secured the mesh centrally with an additional trans fascial stitch in & out the mesh using #1 prolene under laparoscopic visualization right superior and inferior paramedian.  Left superior inferior paramedian.  I also did #1 PDS retention sutures on the upper end of the midline fascial closure and inferior end.  That way tacking centrally x6..   I tacked the edges & central part of the mesh to the peritoneum/posterior rectus fascia with SecureStrap absorbable tacks.   I did reinspection. Hemostasis was good. Mesh laid well. I completed a broad field block of local anesthesia at fascial stitch sites & fascial closure areas.    Capnoperitoneum was evacuated. Ports were removed. The skin was closed with Monocryl at the port sites and Steri-Strips on the fascial stitch puncture sites.  I created some skin flaps around the hernia sac and excised some redundant hernia sac as well as some thinned out skin.  I placed a 15 [place] Blake drain through the right upper quadrant into the chronic hernia sac.  Secured the  skin with Prolene suture.  I closed the periumbilical wound with 2-0 Vicryl deep dermal sutures and running 4 Monocryl suture.  Sterile dressings applied.  The patient is extubated in recovery room.  I discussed operative findings, updated the patient's status, discussed probable steps to recovery, and gave postoperative recommendations to the Patient's friend.  Recommendations were made.   Questions were answered.  She expressed understanding & appreciation.  [name], M.D., F.A.C.S. Gastrointestinal and Minimally Invasive Surgery [facility] [address], [place], [place] [address] [phone] Main / Paging  05/12/2018 10:30 AM

## 2018-05-13 ENCOUNTER — Encounter (HOSPITAL_COMMUNITY): Payer: Self-pay | Admitting: Surgery

## 2018-05-13 DIAGNOSIS — K430 Incisional hernia with obstruction, without gangrene: Secondary | ICD-10-CM | POA: Diagnosis not present

## 2018-05-13 LAB — GLUCOSE, CAPILLARY: Glucose-Capillary: 180 mg/dL — ABNORMAL HIGH (ref 70–99)

## 2018-05-13 NOTE — Discharge Summary (Signed)
Physician Discharge Summary    Patient ID: Billy Horn MRN: [id number] DOB/AGE: [birth year]  73 y.o.  Patient Care Team: Alroy Dust, L.Marlou Sa, MD as PCP - General (Family Medicine) Leighton Ruff, MD as Consulting Physician (Colon and Rectal Surgery) Michael Boston, MD as Consulting Physician (General Surgery) Raynelle Bring, MD as Consulting Physician (Urology)  Admit date: 05/12/2018  Discharge date: 05/13/2018  Hospital Stay = 0 days    Discharge Diagnoses:  Principal Problem:   Recurrent ventral incisional hernia s/p lap repair w mesh 05/12/2018 Active Problems:   Diabetes mellitus type 2, uncontrolled ([place])   Diverticulitis with abscess s/p robotic sigmoid colectomy 01/14/2017   Diverticular disease   1 Day Post-Op  05/12/2018  POST-OPERATIVE DIAGNOSIS:  Incisional recurrrent abdominal wall hernias  PROCEDURE:  LAPAROSCOPIC  COMPONENTR SEPARATION INCISIONAL HERNIA REPAIR WITH MESH INSERTION OF MESH  SURGEON:  [name], MD  Consults: None  Hospital Course:   Patient with incisional hernia requiring laparoscopic component separation radiation with underlay repair and primary closure.  Postoperatively, the patient gradually mobilized and advanced to a solid diet.  Pain and other symptoms were treated aggressively.    By the time of discharge, the patient was walking well the hallways, eating food, having flatus.  Pain was well-controlled on an oral medications.  Based on meeting discharge criteria and continuing to recover, I felt it was safe for the patient to be discharged from the hospital to further recover with close followup. Postoperative recommendations were discussed in detail.  They are written as well.  Discharged Condition: good  Discharge Exam: Blood pressure (!) 150/76, pulse 78, temperature 98.6 F (37 C), temperature source Axillary, resp. rate 18, height 6' (1.829 m), weight 89.4 kg, SpO2 97 %.  General: Pt awake/alert/oriented x4 in No acute  distress Eyes: PERRL, normal EOM.  Sclera clear.  No icterus Neuro: CN II-XII intact w/o focal sensory/motor deficits. Lymph: No head/neck/groin lymphadenopathy Psych:  No delerium/psychosis/paranoia HENT: Normocephalic, Mucus membranes moist.  No thrush Neck: Supple, No tracheal deviation Chest: No chest wall pain w good excursion CV:  Pulses intact.  Regular rhythm MS: Normal AROM mjr joints.  No obvious deformity Abdomen: Soft.  Nondistended.  Mildly tender at incisions only.  No evidence of peritonitis.  No incarcerated hernias. Ext:  SCDs BLE.  No mjr edema.  No cyanosis Skin: No petechiae / purpura   Disposition:   Follow-up Information    Michael Boston, MD In 3 weeks.   Specialty:  General Surgery Why:  To follow up after your operation Contact information: [place] [address] [phone]           Discharge disposition: 01-Home or Self Care       Discharge Instructions    Call MD for:   Complete by:  As directed    FEVER > 101.5 F  (temperatures < 101.5 F are not significant)   Call MD for:  extreme fatigue   Complete by:  As directed    Call MD for:  persistant dizziness or light-headedness   Complete by:  As directed    Call MD for:  persistant nausea and vomiting   Complete by:  As directed    Call MD for:  redness, tenderness, or signs of infection (pain, swelling, redness, odor or green/yellow discharge around incision site)   Complete by:  As directed    Call MD for:  severe uncontrolled pain   Complete by:  As directed  Diet - low sodium heart healthy   Complete by:  As directed    Start with a bland diet such as soups, liquids, starchy foods, low fat foods, etc. the first few days at home. Gradually advance to a solid, low-fat, high fiber diet by the end of the first week at home.   Add a fiber supplement to your diet (Metamucil, etc) If you feel full, bloated, or constipated, stay on a full liquid or  pureed/blenderized diet for a few days until you feel better and are no longer constipated.   Discharge instructions   Complete by:  As directed    See Discharge Instructions If you are not getting better after two weeks or are noticing you are getting worse, contact our office [phone] for further advice.  We may need to adjust your medications, re-evaluate you in the office, send you to the emergency room, or see what other things we can do to help. The clinic staff is available to answer your questions during regular business hours (8:30am-5pm).  Please don't hesitate to call and ask to speak to one of our nurses for clinical concerns.    A surgeon from [facility] is always on call at the hospitals 24 hours/day If you have a medical emergency, go to the nearest emergency room or call 911.   Discharge wound care:   Complete by:  As directed    It is good for closed incisions and even open wounds to be washed every day.  Shower every day.  Short baths are fine.  Wash the incisions and wounds clean with soap & water.     You may leave closed incisions open to air if it is dry.   You may cover the incision with clean gauze & replace it after your daily shower for comfort.   If you have skin tapes (Steristrips) or skin glue (Dermabond) on your incision, leave them in place.  They will fall off on their own like a scab.  You may trim any edges that curl up with clean scissors.    If you have skin staples, set up an appointment for them to be removed in the office in 10 days after surgery.  If you have a drain, wash around the skin exit site with soap & water and place a new dressing of gauze or band aid around the skin every day.  Keep the drain site clean & dry.   Driving Restrictions   Complete by:  As directed    You may drive when: - you are no longer taking narcotic prescription pain medication - you can comfortably wear a seatbelt - you can safely make sudden turns/stops  without pain.   Increase activity slowly   Complete by:  As directed    Start light daily activities --- self-care, walking, climbing stairs- beginning the day after surgery.  Gradually increase activities as tolerated.  Control your pain to be active.  Stop when you are tired.  Ideally, walk several times a day, eventually an hour a day.   Most people are back to most day-to-day activities in a few weeks.  It takes 4-6 weeks to get back to unrestricted, intense activity. If you can walk 30 minutes without difficulty, it is safe to try more intense activity such as jogging, treadmill, bicycling, low-impact aerobics, swimming, etc. Save the most intensive and strenuous activity for last (Usually 4-8 weeks after surgery) such as sit-ups, heavy lifting, contact sports, etc.  Refrain from any intense heavy lifting or straining until you are off narcotics for pain control.  You will have off days, but things should improve week-by-week. DO NOT PUSH THROUGH PAIN.  Let pain be your guide: If it hurts to do something, don't do it.   Lifting restrictions   Complete by:  As directed    If you can walk 30 minutes without difficulty, it is safe to try more intense activity such as jogging, treadmill, bicycling, low-impact aerobics, swimming, etc. Save the most intensive and strenuous activity for last (Usually 4-8 weeks after surgery) such as sit-ups, heavy lifting, contact sports, etc.   Refrain from any intense heavy lifting or straining until you are off narcotics for pain control.  You will have off days, but things should improve week-by-week. DO NOT PUSH THROUGH PAIN.  Let pain be your guide: If it hurts to do something, don't do it.  Pain is your body warning you to avoid that activity for another week until the pain goes down.   May shower / Bathe   Complete by:  As directed    May walk up steps   Complete by:  As directed    Sexual Activity Restrictions   Complete by:  As directed    You may have  sexual intercourse when it is comfortable. If it hurts to do something, stop.      Allergies as of 05/13/2018   No Known Allergies     Medication List    STOP taking these medications   docusate sodium 100 MG capsule Commonly known as:  COLACE   ferrous sulfate 325 (65 FE) MG tablet     TAKE these medications   acetaminophen 325 MG tablet Commonly known as:  TYLENOL Take 1-2 tablets (325-650 mg total) by mouth every 4 (four) hours as needed.   aspirin EC 81 MG tablet Take 81 mg by mouth daily after breakfast.   CALCIUM-CARB 600 PO Take 600 mg by mouth daily after breakfast.   gabapentin 100 MG capsule Commonly known as:  NEURONTIN Take 2 capsules (200 mg total) by mouth 3 (three) times daily as needed (for pain).   meloxicam 15 MG tablet Commonly known as:  MOBIC Take 15 mg by mouth daily.   METAMUCIL 0.52 g capsule Generic drug:  psyllium Take 1.04 g by mouth daily after breakfast.   multivitamin with minerals Tabs tablet Take 1 tablet by mouth daily after breakfast.   traMADol 50 MG tablet Commonly known as:  ULTRAM Take 1-2 tablets (50-100 mg total) by mouth every 6 (six) hours as needed for moderate pain or severe pain.            Discharge Care Instructions  (From admission, onward)         Start     Ordered   05/12/18 0000  Discharge wound care:    Comments:  It is good for closed incisions and even open wounds to be washed every day.  Shower every day.  Short baths are fine.  Wash the incisions and wounds clean with soap & water.     You may leave closed incisions open to air if it is dry.   You may cover the incision with clean gauze & replace it after your daily shower for comfort.   If you have skin tapes (Steristrips) or skin glue (Dermabond) on your incision, leave them in place.  They will fall off on their own like a scab.  You may  trim any edges that curl up with clean scissors.    If you have skin staples, set up an appointment for them  to be removed in the office in 10 days after surgery.  If you have a drain, wash around the skin exit site with soap & water and place a new dressing of gauze or band aid around the skin every day.  Keep the drain site clean & dry.   05/12/18 0604          Significant Diagnostic Studies:  Results for orders placed or performed during the hospital encounter of 05/12/18 (from the past 72 hour(s))  Glucose, capillary     Status: Abnormal   Collection Time: 05/12/18  6:01 AM  Result Value Ref Range   Glucose-Capillary 297 (H) 70 - 99 mg/dL   Comment 1 Notify RN    Comment 2 Document in Chart   Glucose, capillary     Status: Abnormal   Collection Time: 05/12/18 10:32 AM  Result Value Ref Range   Glucose-Capillary 261 (H) 70 - 99 mg/dL  Glucose, capillary     Status: Abnormal   Collection Time: 05/12/18 11:33 AM  Result Value Ref Range   Glucose-Capillary 245 (H) 70 - 99 mg/dL  Glucose, capillary     Status: Abnormal   Collection Time: 05/12/18  5:04 PM  Result Value Ref Range   Glucose-Capillary 288 (H) 70 - 99 mg/dL  Glucose, capillary     Status: Abnormal   Collection Time: 05/12/18 10:44 PM  Result Value Ref Range   Glucose-Capillary 277 (H) 70 - 99 mg/dL  Glucose, capillary     Status: Abnormal   Collection Time: 05/13/18  7:22 AM  Result Value Ref Range   Glucose-Capillary 180 (H) 70 - 99 mg/dL    No results found.  Past Medical History:  Diagnosis Date  . Anemia   . Cancer [facility]) 2012   prostate cancer  . Colonic diverticular abscess 10/27/2016  . Diabetes mellitus    controlled with diet and exercise  . Liver abscess   . Pneumonia    as a child    Past Surgical History:  Procedure Laterality Date  . COLONOSCOPY WITH PROPOFOL N/A 11/09/2016   Procedure: COLONOSCOPY WITH PROPOFOL;  Surgeon: Laurence Spates, MD;  Location: [facility];  Service: Endoscopy;  Laterality: N/A;  . IR RADIOLOGIST EVAL & MGMT  11/04/2016  . ORIF TIBIA PLATEAU Left 07/23/2012    Procedure: OPEN REDUCTION INTERNAL FIXATION (ORIF) TIBIAL PLATEAU;  Surgeon: Sharmon Revere, MD;  Location: [facility];  Service: Orthopedics;  Laterality: Left;  . ROBOT ASSISTED LAPAROSCOPIC RADICAL PROSTATECTOMY  04/06/2011   Procedure: ROBOTIC ASSISTED LAPAROSCOPIC RADICAL PROSTATECTOMY LEVEL 2;  Surgeon: Dutch Gray, MD;  Location: [facility];  Service: Urology;  Laterality: N/A;  Bilateral Pelvic Lymphadenectomy   . TONSILLECTOMY     as a child    Social History   Socioeconomic History  . Marital status: Single    Spouse name: Not on file  . Number of children: Not on file  . Years of education: Not on file  . Highest education level: Not on file  Occupational History  . Occupation: Chief Executive Officer - real estate  Social Needs  . Financial resource strain: Not on file  . Food insecurity:    Worry: Not on file    Inability: Not on file  . Transportation needs:    Medical: Not on file    Non-medical: Not on file  Tobacco  Use  . Smoking status: Never Smoker  . Smokeless tobacco: Never Used  Substance and Sexual Activity  . Alcohol use: Yes    Comment: occas wine  . Drug use: No  . Sexual activity: Not on file  Lifestyle  . Physical activity:    Days per week: Not on file    Minutes per session: Not on file  . Stress: Not on file  Relationships  . Social connections:    Talks on phone: Not on file    Gets together: Not on file    Attends religious service: Not on file    Active member of club or organization: Not on file    Attends meetings of clubs or organizations: Not on file    Relationship status: Not on file  . Intimate partner violence:    Fear of current or ex partner: Not on file    Emotionally abused: Not on file    Physically abused: Not on file    Forced sexual activity: Not on file  Other Topics Concern  . Not on file  Social History Narrative  . Not on file    Family History  Problem Relation Age of Onset  . Lung cancer Mother 66    Current  Facility-Administered Medications  Medication Dose Route Frequency Provider Last Rate Last Dose  . 0.9 %  sodium chloride infusion   Intravenous Continuous Michael Boston, MD 50 mL/hr at 05/13/18 0645    . 0.9 %  sodium chloride infusion  250 mL Intravenous PRN Michael Boston, MD      . acetaminophen (TYLENOL) tablet 1,000 mg  1,000 mg Oral Tor Netters, MD   1,000 mg at 05/13/18 0644  . alum & mag hydroxide-simeth (MAALOX/MYLANTA) 200-200-20 MG/5ML suspension 30 mL  30 mL Oral Q6H PRN Michael Boston, MD      . aspirin EC tablet 81 mg  81 mg Oral Daily Michael Boston, MD      . bisacodyl (DULCOLAX) suppository 10 mg  10 mg Rectal Daily PRN Michael Boston, MD      . calcium carbonate (OS-CAL - dosed in mg of elemental calcium) tablet 500 mg of elemental calcium  1 tablet Oral Q breakfast Michael Boston, MD      . diphenhydrAMINE (BENADRYL) 12.5 MG/5ML elixir 12.5 mg  12.5 mg Oral Q6H PRN Michael Boston, MD       Or  . diphenhydrAMINE (BENADRYL) injection 12.5 mg  12.5 mg Intravenous Q6H PRN Michael Boston, MD      . enalaprilat (VASOTEC) injection 0.625-1.25 mg  0.625-1.25 mg Intravenous Q6H PRN Michael Boston, MD      . enoxaparin (LOVENOX) injection 40 mg  40 mg Subcutaneous Q24H Michael Boston, MD      . gabapentin (NEURONTIN) capsule 300 mg  300 mg Oral BID Michael Boston, MD   300 mg at 05/12/18 2240  . guaiFENesin-dextromethorphan (ROBITUSSIN DM) 100-10 MG/5ML syrup 10 mL  10 mL Oral Q4H PRN Michael Boston, MD      . hydrALAZINE (APRESOLINE) injection 5-20 mg  5-20 mg Intravenous Q4H PRN Michael Boston, MD      . hydrocortisone (ANUSOL-HC) 2.5 % rectal cream 1 application  1 application Topical QID PRN Michael Boston, MD      . hydrocortisone cream 1 % 1 application  1 application Topical TID PRN Michael Boston, MD      . HYDROmorphone (DILAUDID) injection 0.5-2 mg  0.5-2 mg Intravenous Q2H PRN Michael Boston, MD      .  insulin aspart (novoLOG) injection 0-15 Units  0-15 Units Subcutaneous TID WC  Michael Boston, MD   8 Units at 05/12/18 1716  . insulin aspart (novoLOG) injection 0-5 Units  0-5 Units Subcutaneous Ardeen Fillers, MD   3 Units at 05/12/18 2249  . lactated ringers bolus 1,000 mL  1,000 mL Intravenous Q8H PRN Michael Boston, MD      . lactated ringers infusion 1,000 mL  1,000 mL Intravenous Q8H PRN Renso Swett, Remo Lipps, MD      . lip balm (CARMEX) ointment 1 application  1 application Topical BID Michael Boston, MD   1 application at 02/54/27 2240  . magic mouthwash  15 mL Oral QID PRN Michael Boston, MD      . meloxicam [facility]) tablet 15 mg  15 mg Oral Daily Michael Boston, MD   15 mg at 05/12/18 1456  . menthol-cetylpyridinium (CEPACOL) lozenge 3 mg  1 lozenge Oral PRN Michael Boston, MD      . methocarbamol (ROBAXIN) 1,000 mg in dextrose 5 % 50 mL IVPB  1,000 mg Intravenous Q6H PRN Michael Boston, MD      . methocarbamol (ROBAXIN) tablet 1,000 mg  1,000 mg Oral Q6H PRN Michael Boston, MD      . metoprolol tartrate (LOPRESSOR) injection 5 mg  5 mg Intravenous Q6H PRN Michael Boston, MD      . multivitamin with minerals tablet 1 tablet  1 tablet Oral Daily Michael Boston, MD      . ondansetron (ZOFRAN-ODT) disintegrating tablet 4 mg  4 mg Oral Q6H PRN Michael Boston, MD       Or  . ondansetron (ZOFRAN) injection 4 mg  4 mg Intravenous Q6H PRN Michael Boston, MD      . oxyCODONE (Oxy IR/ROXICODONE) immediate release tablet 5-10 mg  5-10 mg Oral Q4H PRN Michael Boston, MD      . phenol (CHLORASEPTIC) mouth spray 1-2 spray  1-2 spray Mouth/Throat PRN Michael Boston, MD      . polyethylene glycol (MIRALAX / GLYCOLAX) packet 17 g  17 g Oral Daily PRN Michael Boston, MD      . prochlorperazine (COMPAZINE) tablet 10 mg  10 mg Oral Q6H PRN Michael Boston, MD       Or  . prochlorperazine (COMPAZINE) injection 5-10 mg  5-10 mg Intravenous Q6H PRN Michael Boston, MD      . psyllium (HYDROCIL/METAMUCIL) packet 1 packet  1 packet Oral Daily Michael Boston, MD      . simethicone (MYLICON) chewable tablet 40  mg  40 mg Oral Q6H PRN Michael Boston, MD      . sodium chloride flush (NS) 0.9 % injection 3 mL  3 mL Intravenous Gorden Harms, MD   3 mL at 05/12/18 2200  . sodium chloride flush (NS) 0.9 % injection 3 mL  3 mL Intravenous PRN Michael Boston, MD         No Known Allergies  Signed: Morton Peters, MD, FACS, MASCRS [facility]    [address] [place], [place] [address] [phone] Main / Paging [phone] Fax   05/13/2018, 7:27 AM

## 2018-05-13 NOTE — Discharge Instructions (Signed)
DRAIN CARE:   You have a closed bulb drain to help you heal.    A bulb drain is a small, plastic reservoir which creates a gentle suction. It is used to remove excess fluid from a surgical wound. The color and amount of fluid will vary. Immediately after surgery, the fluid is bright red. It may gradually change to a yellow color. When the amount decreases to about 1 or 2 tablespoons (15 to 30 cc) per 24 hours, your caregiver will usually remove it.  JP Care  The Jackson-Pratt drainage system has flexible tubing attached to a soft, plastic bulb with a stopper. The drainage end of the tubing, which is flat and white, goes into your body through a small opening near your incision (surgical cut). A stitch holds the drainage end in place. The rest of the tube is outside your body, attached to the bulb. When the bulb is compressed with the stopper in place, it creates a vacuum. This causes a constant gentle suction, which helps draw out fluid that collects under your incision. The bulb should be compressed at all times, except when you are emptying the drainage.  How long you will have your Jackson-Pratt depends on your surgery and the amount of fluid is draining. This is different for everyone. The Jackson-Pratt is usually removed when the drainage is 30 mL or less over 24 hours. To keep track of how much drainage youre having, you will record the amount in a drainage log. Its important to bring the log with you to your follow-up appointments.  Caring for Your Jackson-Pratt at Home In order to care for your Jackson-Pratt at home, you or your caregiver will do the following:  Empty the drain once a day and record the color and amount of drainage  Care for the area where the tubing enters your skin by washing with soap and water.  Milk the tubing to help move clots into the bulb.  Do this before you empty and measure your drainage. Look in the mirror at the tubing. This will help you see where your  hands need to be. Pinch the tubing close to where it goes into your skin between your thumb and forefinger. With the thumb and forefinger of your other hand, pinch the tubing right below your other fingers. Keep your fingers pinched and slide them down the tubing, pushing any clots down toward the bulb. You may want to use alcohol swabs to help you slide your fingers down the tubing. Repeat steps 3 and 4 as necessary to push clots from the tubing into the bulb. If you are not able to move a clot into the bulb, call your doctors office. The fluid may leak around the insertion site if a clot is blocking the drainage flow. If there is fluid in the bulb and no leakage at the insertion site, the drain is working.  How to Empty Your Jackson-Pratt and Record the Drainage You will need to empty your Jackson-Pratt every day  Gather the following supplies:  Measuring container your nurse gave you Jackson-Pratt Drainage Record  Pen or pencil  Instructions Clean an area to work on. Clean your hands thoroughly. Unplug the stopper on top of your Jackson-Pratt. This will cause the bulb to expand. Do not touch the inside of the stopper or the inner area of the opening on the bulb. Turn your Jackson-Pratt upside down, gently squeeze the bulb, and pour the drainage into the measuring container. Turn your Jackson-Pratt right  side up. Squeeze the bulb until your fingers feel the palm of your hand. Keep squeezing the bulb while you replug the stopper. Make sure the bulb stays fully compressed to ensure constant, gentle suction.    Check the amount and color of drainage in the measuring container. The first couple days after surgery the fluid may be dark red. This is normal. As you heal the fluid may look pink or pale yellow. Record this amount and the color of drainage on your Jackson-Pratt Drainage Record. Flush the drainage down the toilet and rinse the measuring container with water.  Caring for the  Insertion Site  Once you have emptied the drainage, clean your hands again. Check the area around the insertion site. Look for tenderness, swelling, or pus. If you have any of these, or if you have a temperature of 101 F (38.3 C) or higher, you may have an infection. Call your doctors office.  Sometimes, the drain causes redness the size of a dime at your insertion site. This is normal. Your healthcare provider will tell you if you should place a bandage over the insertion site.  Wash drain site with soap & water (dilute hydrogen peroxide PRN) daily & replace clean dressing / tape    DAILY CARE  Keep the bulb compressed at all times, except while emptying it. The compression creates suction.   Keep sites where the tubes enter the skin dry and covered with a light bandage (dressing).   Tape the tubes to your skin, 1 to 2 inches below the insertion sites, to keep from pulling on your stitches. Tubes are stitched in place and will not slip out.   Pin the bulb to your shirt (not to your pants) with a safety pin.   For the first few days after surgery, there usually is more fluid in the bulb. Empty the bulb whenever it becomes half full because the bulb does not create enough suction if it is too full. Include this amount in your 24 hour totals.   When the amount of drainage decreases, empty the bulb at the same time every day. Write down the amounts and the 24 hour totals. Your caregiver will want to know them. This helps your caregiver know when the tubes can be removed.   (We anticipate removing the drain in 1-3 weeks, depending on when the output is <12m a day for 2+ days)  If there is drainage around the tube sites, change dressings and keep the area dry. If you see a clot in the tube, leave it alone. However, if the tube does not appear to be draining, let your caregiver know.  TO EMPTY THE BULB  Open the stopper to release suction.   Holding the stopper out of the way, pour  drainage into the measuring cup that was sent home with you.   Measure and write down the amount. If there are 2 bulbs, note the amount of drainage from bulb 1 or bulb 2 and keep the totals separate. Your caregiver will want to know which tube is draining more.   Compress the bulb by folding it in half.   Replace the stopper.   Check the tape that holds the tube to your skin, and pin the bulb to your shirt.  SEEK MEDICAL CARE IF:  The drainage develops a bad odor.   You have an oral temperature above 102 F (38.9 C).   The amount of drainage from your wound suddenly increases or decreases.  You accidentally pull out your drain.   You have any other questions or concerns.  MAKE SURE YOU:   Understand these instructions.   Will watch your condition.   Will get help right away if you are not doing well or get worse.     Call our office if you have any questions about your drain. [place]:   You have a closed bulb drain to help you heal.    A bulb drain is a small, plastic reservoir which creates a gentle suction. It is used to remove excess fluid from a surgical wound. The color and amount of fluid will vary. Immediately after surgery, the fluid is bright red. It may gradually change to a yellow color. When the amount decreases to about 1 or 2 tablespoons (15 to 30 cc) per 24 hours, your caregiver will usually remove it.  JP Care  The Jackson-Pratt drainage system has flexible tubing attached to a soft, plastic bulb with a stopper. The drainage end of the tubing, which is flat and white, goes into your body through a small opening near your incision (surgical cut). A stitch holds the drainage end in place. The rest of the tube is outside your body, attached to the bulb. When the bulb is compressed with the stopper in place, it creates a vacuum. This causes a constant gentle suction, which helps draw out fluid that collects under your incision. The bulb should be  compressed at all times, except when you are emptying the drainage.  How long you will have your Jackson-Pratt depends on your surgery and the amount of fluid is draining. This is different for everyone. The Jackson-Pratt is usually removed when the drainage is 30 mL or less over 24 hours. To keep track of how much drainage youre having, you will record the amount in a drainage log. Its important to bring the log with you to your follow-up appointments.  Caring for Your Jackson-Pratt at Home In order to care for your Jackson-Pratt at home, you or your caregiver will do the following:  Empty the drain once a day and record the color and amount of drainage  Care for the area where the tubing enters your skin by washing with soap and water.  Milk the tubing to help move clots into the bulb.  Do this before you empty and measure your drainage. Look in the mirror at the tubing. This will help you see where your hands need to be. Pinch the tubing close to where it goes into your skin between your thumb and forefinger. With the thumb and forefinger of your other hand, pinch the tubing right below your other fingers. Keep your fingers pinched and slide them down the tubing, pushing any clots down toward the bulb. You may want to use alcohol swabs to help you slide your fingers down the tubing. Repeat steps 3 and 4 as necessary to push clots from the tubing into the bulb. If you are not able to move a clot into the bulb, call your doctors office. The fluid may leak around the insertion site if a clot is blocking the drainage flow. If there is fluid in the bulb and no leakage at the insertion site, the drain is working.  How to Empty Your Jackson-Pratt and Record the Drainage You will need to empty your Jackson-Pratt every day  Gather the following supplies:  Measuring container your nurse gave you Jackson-Pratt Drainage Record  Pen or pencil  Instructions Clean an  area to work on. Clean your  hands thoroughly. Unplug the stopper on top of your Jackson-Pratt. This will cause the bulb to expand. Do not touch the inside of the stopper or the inner area of the opening on the bulb. Turn your Jackson-Pratt upside down, gently squeeze the bulb, and pour the drainage into the measuring container. Turn your Jackson-Pratt right side up. Squeeze the bulb until your fingers feel the palm of your hand. Keep squeezing the bulb while you replug the stopper. Make sure the bulb stays fully compressed to ensure constant, gentle suction.    Check the amount and color of drainage in the measuring container. The first couple days after surgery the fluid may be dark red. This is normal. As you heal the fluid may look pink or pale yellow. Record this amount and the color of drainage on your Jackson-Pratt Drainage Record. Flush the drainage down the toilet and rinse the measuring container with water.  Caring for the Insertion Site  Once you have emptied the drainage, clean your hands again. Check the area around the insertion site. Look for tenderness, swelling, or pus. If you have any of these, or if you have a temperature of 101 F (38.3 C) or higher, you may have an infection. Call your doctors office.  Sometimes, the drain causes redness the size of a dime at your insertion site. This is normal. Your healthcare provider will tell you if you should place a bandage over the insertion site.  Wash drain site with soap & water (dilute hydrogen peroxide PRN) daily & replace clean dressing / tape    DAILY CARE  Keep the bulb compressed at all times, except while emptying it. The compression creates suction.   Keep sites where the tubes enter the skin dry and covered with a light bandage (dressing).   Tape the tubes to your skin, 1 to 2 inches below the insertion sites, to keep from pulling on your stitches. Tubes are stitched in place and will not slip out.   Pin the bulb to your shirt (not to  your pants) with a safety pin.   For the first few days after surgery, there usually is more fluid in the bulb. Empty the bulb whenever it becomes half full because the bulb does not create enough suction if it is too full. Include this amount in your 24 hour totals.   When the amount of drainage decreases, empty the bulb at the same time every day. Write down the amounts and the 24 hour totals. Your caregiver will want to know them. This helps your caregiver know when the tubes can be removed.   (We anticipate removing the drain in 1-3 weeks, depending on when the output is <34mL a day for 2+ days)  If there is drainage around the tube sites, change dressings and keep the area dry. If you see a clot in the tube, leave it alone. However, if the tube does not appear to be draining, let your caregiver know.  TO EMPTY THE BULB  Open the stopper to release suction.   Holding the stopper out of the way, pour drainage into the measuring cup that was sent home with you.   Measure and write down the amount. If there are 2 bulbs, note the amount of drainage from bulb 1 or bulb 2 and keep the totals separate. Your caregiver will want to know which tube is draining more.   Compress the bulb by folding it in half.  Replace the stopper.   Check the tape that holds the tube to your skin, and pin the bulb to your shirt.  SEEK MEDICAL CARE IF:  The drainage develops a bad odor.   You have an oral temperature above 102 F (38.9 C).   The amount of drainage from your wound suddenly increases or decreases.   You accidentally pull out your drain.   You have any other questions or concerns.  MAKE SURE YOU:   Understand these instructions.   Will watch your condition.   Will get help right away if you are not doing well or get worse.     Call our office if you have any questions about your drain. [phone]   HERNIA REPAIR: POST OP  INSTRUCTIONS  ######################################################################  EAT Gradually transition to a high fiber diet with a fiber supplement over the next few weeks after discharge.  Start with a pureed / full liquid diet (see below)  WALK Walk an hour a day.  Control your pain to do that.    CONTROL PAIN Control pain so that you can walk, sleep, tolerate sneezing/coughing, and go up/down stairs.  HAVE A BOWEL MOVEMENT DAILY Keep your bowels regular to avoid problems.  OK to try a laxative to override constipation.  OK to use an antidairrheal to slow down diarrhea.  Call if not better after 2 tries  CALL IF YOU HAVE PROBLEMS/CONCERNS Call if you are still struggling despite following these instructions. Call if you have concerns not answered by these instructions  ######################################################################    1. DIET: Follow a light bland diet the first 24 hours after arrival home, such as soup, liquids, crackers, etc.  Be sure to include lots of fluids daily.  Advance to a low fat / high fiber diet over the next few days after surgery.  Avoid fast food or heavy meals the first week as your are more likely to get nauseated.    2. Take your usually prescribed home medications unless otherwise directed.  3. PAIN CONTROL: a. Pain is best controlled by a usual combination of three different methods TOGETHER: i. Ice/Heat ii. Over the counter pain medication iii. Prescription pain medication b. Most patients will experience some swelling and bruising around the hernia(s) such as the bellybutton, groins, or old incisions.  Ice packs or heating pads (30-60 minutes up to 6 times a day) will help. Use ice for the first few days to help decrease swelling and bruising, then switch to heat to help relax tight/sore spots and speed recovery.  Some people prefer to use ice alone, heat alone, alternating between ice & heat.  Experiment to what works for you.   Swelling and bruising can take several weeks to resolve.   c. It is helpful to take an over-the-counter pain medication regularly for the first few weeks.  Choose one of the following that works best for you: i. Naproxen (Aleve, etc)  Two 220mg  tabs twice a day ii. Ibuprofen (Advil, etc) Three 200mg  tabs four times a day (every meal & bedtime) iii. Acetaminophen (Tylenol, etc) 325-650mg  four times a day (every meal & bedtime) d. A  prescription for pain medication should be given to you upon discharge.  Take your pain medication as prescribed.  i. If you are having problems/concerns with the prescription medicine (does not control pain, nausea, vomiting, rash, itching, etc), please call us [phone] to see if we need to switch you to a different pain medicine that will work better  for you and/or control your side effect better. ii. If you need a refill on your pain medication, please contact your pharmacy.  They will contact our office to request authorization. Prescriptions will not be filled after 5 pm or on week-ends.  4. Avoid getting constipated.  Between the surgery and the pain medications, it is common to experience some constipation.  Increasing fluid intake and taking a fiber supplement (such as Metamucil, Citrucel, FiberCon, MiraLax, etc) 1-2 times a day regularly will usually help prevent this problem from occurring.  A mild laxative (prune juice, Milk of Magnesia, MiraLax, etc) should be taken according to package directions if there are no bowel movements after 48 hours.    5. Wash / shower every day.  You may shower over the dressings as they are waterproof.    6. Remove your waterproof bandages, skin tapes, and other bandages 5 days after surgery. You may replace a dressing/Band-Aid to cover the incision for comfort if you wish. You may leave the incisions open to air.  You may replace a dressing/Band-Aid to cover an incision for comfort if you wish.  Continue to shower over  incision(s) after the dressing is off.  7. ACTIVITIES as tolerated:   a. You may resume regular (light) daily activities beginning the next day--such as daily self-care, walking, climbing stairs--gradually increasing activities as tolerated.  Control your pain so that you can walk an hour a day.  If you can walk 30 minutes without difficulty, it is safe to try more intense activity such as jogging, treadmill, bicycling, low-impact aerobics, swimming, etc. b. Save the most intensive and strenuous activity for last such as sit-ups, heavy lifting, contact sports, etc  Refrain from any heavy lifting or straining until you are off narcotics for pain control.   c. DO NOT PUSH THROUGH PAIN.  Let pain be your guide: If it hurts to do something, don't do it.  Pain is your body warning you to avoid that activity for another week until the pain goes down. d. You may drive when you are no longer taking prescription pain medication, you can comfortably wear a seatbelt, and you can safely maneuver your car and apply brakes. e. Dennis Bast may have sexual intercourse when it is comfortable.   8. FOLLOW UP in our office a. Please call [facility] at [phone] to set up an appointment to see your surgeon in the office for a follow-up appointment approximately 2-3 weeks after your surgery. b. Make sure that you call for this appointment the day you arrive home to insure a convenient appointment time.  9.  If you have disability of FMLA / Family leave forms, please bring the forms to the office for processing.  (do not give to your surgeon).  WHEN TO CALL us [phone]: 1. Poor pain control 2. Reactions / problems with new medications (rash/itching, nausea, etc)  3. Fever over 101.5 F (38.5 C) 4. Inability to urinate 5. Nausea and/or vomiting 6. Worsening swelling or bruising 7. Continued bleeding from incision. 8. Increased pain, redness, or drainage from the incision   The clinic staff is available to answer your  questions during regular business hours (8:30am-5pm).  Please dont hesitate to call and ask to speak to one of our nurses for clinical concerns.   If you have a medical emergency, go to the nearest emergency room or call 911.  A surgeon from [facility] is always on call at the hospitals in [facility] [address], [place], [place], [place]  [address] ?  [address] [address], [place],    [address] MAIN: [phone] ? TOLL FREE: [phone] ? FAX: [phone] [email or web address]

## 2018-05-13 NOTE — Progress Notes (Signed)
Writer explained d/c instructions to patient. Patient had no questions. NT will wheel patient out

## 2018-10-21 ENCOUNTER — Inpatient Hospital Stay (HOSPITAL_COMMUNITY): Payer: PPO

## 2018-10-21 ENCOUNTER — Inpatient Hospital Stay (HOSPITAL_COMMUNITY)
Admission: EM | Admit: 2018-10-21 | Discharge: 2018-10-25 | DRG: 388 | Disposition: A | Discharge status: Home / Self Care | Payer: PPO | Attending: Internal Medicine | Admitting: Internal Medicine

## 2018-10-21 ENCOUNTER — Encounter (HOSPITAL_COMMUNITY): Payer: Self-pay

## 2018-10-21 ENCOUNTER — Emergency Department (HOSPITAL_COMMUNITY): Payer: PPO

## 2018-10-21 ENCOUNTER — Other Ambulatory Visit: Payer: Self-pay

## 2018-10-21 DIAGNOSIS — R509 Fever, unspecified: Secondary | ICD-10-CM

## 2018-10-21 DIAGNOSIS — K56609 Unspecified intestinal obstruction, unspecified as to partial versus complete obstruction: Secondary | ICD-10-CM | POA: Diagnosis not present

## 2018-10-21 DIAGNOSIS — E1100 Type 2 diabetes mellitus with hyperosmolarity without nonketotic hyperglycemic-hyperosmolar coma (NKHHC): Secondary | ICD-10-CM | POA: Diagnosis present

## 2018-10-21 DIAGNOSIS — Z8546 Personal history of malignant neoplasm of prostate: Secondary | ICD-10-CM

## 2018-10-21 DIAGNOSIS — IMO0002 Reserved for concepts with insufficient information to code with codable children: Secondary | ICD-10-CM | POA: Diagnosis present

## 2018-10-21 DIAGNOSIS — Z791 Long term (current) use of non-steroidal anti-inflammatories (NSAID): Secondary | ICD-10-CM

## 2018-10-21 DIAGNOSIS — Z7982 Long term (current) use of aspirin: Secondary | ICD-10-CM

## 2018-10-21 DIAGNOSIS — R739 Hyperglycemia, unspecified: Secondary | ICD-10-CM | POA: Diagnosis not present

## 2018-10-21 DIAGNOSIS — Z4682 Encounter for fitting and adjustment of non-vascular catheter: Secondary | ICD-10-CM | POA: Diagnosis not present

## 2018-10-21 DIAGNOSIS — K5669 Other partial intestinal obstruction: Secondary | ICD-10-CM | POA: Diagnosis not present

## 2018-10-21 DIAGNOSIS — R112 Nausea with vomiting, unspecified: Secondary | ICD-10-CM | POA: Diagnosis not present

## 2018-10-21 DIAGNOSIS — N281 Cyst of kidney, acquired: Secondary | ICD-10-CM | POA: Diagnosis not present

## 2018-10-21 DIAGNOSIS — K565 Intestinal adhesions [bands], unspecified as to partial versus complete obstruction: Secondary | ICD-10-CM | POA: Diagnosis not present

## 2018-10-21 DIAGNOSIS — K8020 Calculus of gallbladder without cholecystitis without obstruction: Secondary | ICD-10-CM | POA: Diagnosis not present

## 2018-10-21 DIAGNOSIS — K567 Ileus, unspecified: Secondary | ICD-10-CM | POA: Diagnosis not present

## 2018-10-21 DIAGNOSIS — E1110 Type 2 diabetes mellitus with ketoacidosis without coma: Secondary | ICD-10-CM

## 2018-10-21 DIAGNOSIS — E1165 Type 2 diabetes mellitus with hyperglycemia: Secondary | ICD-10-CM | POA: Diagnosis not present

## 2018-10-21 DIAGNOSIS — I10 Essential (primary) hypertension: Secondary | ICD-10-CM | POA: Diagnosis present

## 2018-10-21 DIAGNOSIS — E861 Hypovolemia: Secondary | ICD-10-CM | POA: Diagnosis not present

## 2018-10-21 DIAGNOSIS — Z4659 Encounter for fitting and adjustment of other gastrointestinal appliance and device: Secondary | ICD-10-CM

## 2018-10-21 DIAGNOSIS — N200 Calculus of kidney: Secondary | ICD-10-CM | POA: Diagnosis not present

## 2018-10-21 DIAGNOSIS — Z0189 Encounter for other specified special examinations: Secondary | ICD-10-CM

## 2018-10-21 DIAGNOSIS — Z801 Family history of malignant neoplasm of trachea, bronchus and lung: Secondary | ICD-10-CM | POA: Diagnosis not present

## 2018-10-21 DIAGNOSIS — J984 Other disorders of lung: Secondary | ICD-10-CM | POA: Diagnosis not present

## 2018-10-21 DIAGNOSIS — J690 Pneumonitis due to inhalation of food and vomit: Secondary | ICD-10-CM | POA: Diagnosis not present

## 2018-10-21 DIAGNOSIS — N179 Acute kidney failure, unspecified: Secondary | ICD-10-CM

## 2018-10-21 DIAGNOSIS — Z1159 Encounter for screening for other viral diseases: Secondary | ICD-10-CM

## 2018-10-21 DIAGNOSIS — Z431 Encounter for attention to gastrostomy: Secondary | ICD-10-CM | POA: Diagnosis not present

## 2018-10-21 LAB — BASIC METABOLIC PANEL
Anion gap: 9 (ref 5–15)
BUN: 40 mg/dL — ABNORMAL HIGH (ref 8–23)
CO2: 29 mmol/L (ref 22–32)
Calcium: 8.5 mg/dL — ABNORMAL LOW (ref 8.9–10.3)
Chloride: 100 mmol/L (ref 98–111)
Creatinine, Ser: 1.56 mg/dL — ABNORMAL HIGH (ref 0.61–1.24)
GFR calc Af Amer: 50 mL/min — ABNORMAL LOW (ref >60)
GFR calc non Af Amer: 43 mL/min — ABNORMAL LOW (ref >60)
Glucose, Bld: 372 mg/dL — ABNORMAL HIGH (ref 70–99)
Potassium: 4.1 mmol/L (ref 3.5–5.1)
Sodium: 138 mmol/L (ref 135–145)

## 2018-10-21 LAB — URINALYSIS, ROUTINE W REFLEX MICROSCOPIC
Bacteria, UA: NONE SEEN
Bilirubin Urine: NEGATIVE
Glucose, UA: >=500 mg/dL — AB
Ketones, ur: 5 mg/dL — AB
Leukocytes,Ua: NEGATIVE
Nitrite: NEGATIVE
Protein, ur: 30 mg/dL — AB
RBC / HPF: >50 RBC/hpf — ABNORMAL HIGH (ref 0–5)
Specific Gravity, Urine: 1.037 — ABNORMAL HIGH (ref 1.005–1.030)
pH: 5 (ref 5.0–8.0)

## 2018-10-21 LAB — CBC
HCT: 47.9 % (ref 39.0–52.0)
Hemoglobin: 16.1 g/dL (ref 13.0–17.0)
MCH: 30.8 pg (ref 26.0–34.0)
MCHC: 33.6 g/dL (ref 30.0–36.0)
MCV: 91.6 fL (ref 80.0–100.0)
Platelets: 247 10*3/uL (ref 150–400)
RBC: 5.23 MIL/uL (ref 4.22–5.81)
RDW: 13.4 % (ref 11.5–15.5)
WBC: 12.1 10*3/uL — ABNORMAL HIGH (ref 4.0–10.5)
nRBC: 0 % (ref 0.0–0.2)

## 2018-10-21 LAB — COMPREHENSIVE METABOLIC PANEL
ALT: 20 U/L (ref 0–44)
AST: 23 U/L (ref 15–41)
Albumin: 4.7 g/dL (ref 3.5–5.0)
Alkaline Phosphatase: 117 U/L (ref 38–126)
Anion gap: 16 — ABNORMAL HIGH (ref 5–15)
BUN: 43 mg/dL — ABNORMAL HIGH (ref 8–23)
CO2: 25 mmol/L (ref 22–32)
Calcium: 9.2 mg/dL (ref 8.9–10.3)
Chloride: 94 mmol/L — ABNORMAL LOW (ref 98–111)
Creatinine, Ser: 1.82 mg/dL — ABNORMAL HIGH (ref 0.61–1.24)
GFR calc Af Amer: 42 mL/min — ABNORMAL LOW (ref >60)
GFR calc non Af Amer: 36 mL/min — ABNORMAL LOW (ref >60)
Glucose, Bld: 495 mg/dL — ABNORMAL HIGH (ref 70–99)
Potassium: 4.2 mmol/L (ref 3.5–5.1)
Sodium: 135 mmol/L (ref 135–145)
Total Bilirubin: 1.8 mg/dL — ABNORMAL HIGH (ref 0.3–1.2)
Total Protein: 8.2 g/dL — ABNORMAL HIGH (ref 6.5–8.1)

## 2018-10-21 LAB — LACTIC ACID, PLASMA: Lactic Acid, Venous: 2.4 mmol/L (ref 0.5–1.9)

## 2018-10-21 LAB — MRSA PCR SCREENING: MRSA by PCR: NEGATIVE

## 2018-10-21 LAB — HEMOGLOBIN A1C
Hgb A1c MFr Bld: 8.8 % — ABNORMAL HIGH (ref 4.8–5.6)
Mean Plasma Glucose: 205.86 mg/dL

## 2018-10-21 LAB — GLUCOSE, CAPILLARY: Glucose-Capillary: 285 mg/dL — ABNORMAL HIGH (ref 70–99)

## 2018-10-21 LAB — SARS CORONAVIRUS 2 BY RT PCR (HOSPITAL ORDER, PERFORMED IN ~~LOC~~ HOSPITAL LAB): SARS Coronavirus 2: NEGATIVE

## 2018-10-21 LAB — LIPASE, BLOOD: Lipase: 43 U/L (ref 11–51)

## 2018-10-21 LAB — CBG MONITORING, ED: Glucose-Capillary: 355 mg/dL — ABNORMAL HIGH (ref 70–99)

## 2018-10-21 MED ORDER — INSULIN ASPART 100 UNIT/ML ~~LOC~~ SOLN
0.0000 [IU] | Freq: Three times a day (TID) | SUBCUTANEOUS | Status: DC
  Administered 2018-10-22: 08:00:00 5 [IU] via SUBCUTANEOUS
  Administered 2018-10-22: 2 [IU] via SUBCUTANEOUS
  Administered 2018-10-22: 1 [IU] via SUBCUTANEOUS
  Administered 2018-10-23: 2 [IU] via SUBCUTANEOUS
  Administered 2018-10-23: 1 [IU] via SUBCUTANEOUS
  Administered 2018-10-24 (×2): 2 [IU] via SUBCUTANEOUS
  Administered 2018-10-24: 1 [IU] via SUBCUTANEOUS
  Administered 2018-10-25 (×2): 2 [IU] via SUBCUTANEOUS
  Filled 2018-10-21: qty 0.09

## 2018-10-21 MED ORDER — METOPROLOL TARTRATE 5 MG/5ML IV SOLN
5.0000 mg | INTRAVENOUS | Status: AC | PRN
  Administered 2018-10-21 – 2018-10-22 (×2): 5 mg via INTRAVENOUS
  Filled 2018-10-21 (×2): qty 5

## 2018-10-21 MED ORDER — ACETAMINOPHEN 325 MG PO TABS
650.0000 mg | ORAL_TABLET | Freq: Four times a day (QID) | ORAL | Status: DC | PRN
  Administered 2018-10-24: 18:00:00 650 mg via ORAL
  Filled 2018-10-21: qty 2

## 2018-10-21 MED ORDER — SODIUM CHLORIDE 0.9% FLUSH
3.0000 mL | Freq: Once | INTRAVENOUS | Status: AC
  Administered 2018-10-21: 3 mL via INTRAVENOUS

## 2018-10-21 MED ORDER — SODIUM CHLORIDE 0.9 % IV BOLUS
1000.0000 mL | Freq: Once | INTRAVENOUS | Status: AC
  Administered 2018-10-21: 1000 mL via INTRAVENOUS

## 2018-10-21 MED ORDER — DIATRIZOATE MEGLUMINE & SODIUM 66-10 % PO SOLN
90.0000 mL | Freq: Once | ORAL | Status: AC
  Administered 2018-10-21: 90 mL via NASOGASTRIC
  Filled 2018-10-21: qty 90

## 2018-10-21 MED ORDER — ONDANSETRON HCL 4 MG PO TABS: 4.0000 mg | ORAL_TABLET | Freq: Four times a day (QID) | ORAL | Status: DC | PRN

## 2018-10-21 MED ORDER — SODIUM CHLORIDE 0.9 % IV SOLN
INTRAVENOUS | Status: DC
  Administered 2018-10-21 – 2018-10-23 (×3): via INTRAVENOUS

## 2018-10-21 MED ORDER — ONDANSETRON HCL 4 MG/2ML IJ SOLN: 4.0000 mg | Freq: Four times a day (QID) | INTRAMUSCULAR | Status: DC | PRN

## 2018-10-21 MED ORDER — CHLORHEXIDINE GLUCONATE CLOTH 2 % EX PADS
6.0000 | MEDICATED_PAD | Freq: Every day | CUTANEOUS | Status: DC
  Administered 2018-10-21 – 2018-10-24 (×3): 6 via TOPICAL

## 2018-10-21 MED ORDER — BISACODYL 10 MG RE SUPP
10.0000 mg | Freq: Once | RECTAL | Status: AC
  Administered 2018-10-21: 10 mg via RECTAL
  Filled 2018-10-21: qty 1

## 2018-10-21 MED ORDER — INSULIN ASPART 100 UNIT/ML ~~LOC~~ SOLN
5.0000 [IU] | Freq: Once | SUBCUTANEOUS | Status: AC
  Administered 2018-10-21: 5 [IU] via SUBCUTANEOUS
  Filled 2018-10-21: qty 0.05

## 2018-10-21 MED ORDER — ONDANSETRON HCL 4 MG/2ML IJ SOLN
4.0000 mg | Freq: Once | INTRAMUSCULAR | Status: AC
  Administered 2018-10-21: 4 mg via INTRAVENOUS
  Filled 2018-10-21: qty 2

## 2018-10-21 MED ORDER — HYDRALAZINE HCL 20 MG/ML IJ SOLN
10.0000 mg | Freq: Four times a day (QID) | INTRAMUSCULAR | Status: DC | PRN
  Administered 2018-10-21 – 2018-10-23 (×2): 10 mg via INTRAVENOUS
  Filled 2018-10-21 (×2): qty 1

## 2018-10-21 MED ORDER — PANTOPRAZOLE SODIUM 40 MG IV SOLR
40.0000 mg | Freq: Two times a day (BID) | INTRAVENOUS | Status: DC
  Administered 2018-10-21 – 2018-10-22 (×3): 40 mg via INTRAVENOUS
  Filled 2018-10-21 (×4): qty 40

## 2018-10-21 MED ORDER — LIDOCAINE VISCOUS HCL 2 % MT SOLN
15.0000 mL | Freq: Once | OROMUCOSAL | Status: AC
  Administered 2018-10-21: 15 mL via OROMUCOSAL
  Filled 2018-10-21: qty 15

## 2018-10-21 MED ORDER — SODIUM CHLORIDE 0.9 % IV BOLUS
1000.0000 mL | Freq: Once | INTRAVENOUS | Status: AC
  Administered 2018-10-21: 16:00:00 1000 mL via INTRAVENOUS

## 2018-10-21 MED ORDER — FENTANYL CITRATE (PF) 100 MCG/2ML IJ SOLN: 12.5000 ug | INTRAMUSCULAR | Status: DC | PRN

## 2018-10-21 MED ORDER — SODIUM CHLORIDE 0.9 % IV SOLN
25.0000 mg | Freq: Every evening | INTRAVENOUS | Status: DC | PRN
  Administered 2018-10-21 – 2018-10-23 (×3): 25 mg via INTRAVENOUS
  Filled 2018-10-21 (×2): qty 0.5
  Filled 2018-10-21 (×2): qty 25
  Filled 2018-10-21: qty 0.5

## 2018-10-21 MED ORDER — ACETAMINOPHEN 650 MG RE SUPP: 650.0000 mg | Freq: Four times a day (QID) | RECTAL | Status: DC | PRN

## 2018-10-21 NOTE — H&P (Addendum)
History and Physical  JACK BOLIO TKZ:[id number] DOB: 1946-02-19 DOA: 10/21/2018  PCP: Alroy Dust, L.Marlou Sa, MD Patient coming from: Home   I have personally briefly reviewed patient's old medical records in [place]   Chief Complaint: , nausea and vomiting.   HPI: Billy Horn is a 73 y.o. male with past medical history significant for prostate cancer diagnosed 2012, diabetes type 2, colonic diverticular abscess, ventral hernia mesh repair in January 2020, presents complaining of nausea and vomiting for the last 2 days prior to admission.  He has vomited almost 5 times since Thursday.  Fluid contents.  He had a very small bowel movement the day prior to admission.  He denies any significant abdominal pain at this point.  He has not been able to eat for the last 2 to 3 days.  He denies chest pain, shortness of breath.   Evaluation in the ED; sodium 135, potassium 4.2 chloride 94, glucose 495, BUN 43, creatinine 1.8 anion gap 16, bilirubin 1.8, blood cell 12, hemoglobin 16, glucose 495, UA white blood cells 0-5. CT abdomen and pelvis:Findings consistent with distal small bowel obstruction most likely due to hernia in left lower quadrant of the abdomen. Small staghorn type calculus is noted in lower pole collecting system of left kidney. No hydronephrosis or renal obstruction is noted. Cholelithiasis without inflammation. Sigmoid diverticulosis without inflammation.   Review of Systems: All systems reviewed and apart from history of presenting illness, are negative.  Past Medical History:  Diagnosis Date   Anemia    Cancer ([place]) 2012   prostate cancer   Colonic diverticular abscess 10/27/2016   Diabetes mellitus    controlled with diet and exercise   Liver abscess    Pneumonia    as a child   Past Surgical History:  Procedure Laterality Date   COLONOSCOPY WITH PROPOFOL N/A 11/09/2016   Procedure: COLONOSCOPY WITH PROPOFOL;  Surgeon: Laurence Spates, MD;  Location[facility];  Service: Endoscopy;  Laterality: N/A;   INSERTION OF MESH N/A 05/12/2018   Procedure: INSERTION OF MESH;  Surgeon: Michael Boston, MD;  Location[facility];  Service: General;  Laterality: N/A;   IR RADIOLOGIST EVAL & MGMT  11/04/2016   ORIF TIBIA PLATEAU Left 07/23/2012   Procedure: OPEN REDUCTION INTERNAL FIXATION (ORIF) TIBIAL PLATEAU;  Surgeon: Sharmon Revere, MD;  Location: [facility];  Service: Orthopedics;  Laterality: Left;   ROBOT ASSISTED LAPAROSCOPIC RADICAL PROSTATECTOMY  04/06/2011   Procedure: ROBOTIC ASSISTED LAPAROSCOPIC RADICAL PROSTATECTOMY LEVEL 2;  Surgeon: Dutch Gray, MD;  [facility];  Service: Urology;  Laterality: N/A;  Bilateral Pelvic Lymphadenectomy    TONSILLECTOMY     as a child   VENTRAL HERNIA REPAIR N/A 05/12/2018   Procedure: LAPAROSCOPIC  COMPARTMENT SEPARATION INCISIONAL HERNIA WITH MESH;  Surgeon: Michael Boston, MD;  [facility];  Service: General;  Laterality: N/A;   Social History:  reports that he has never smoked. He has never used smokeless tobacco. He reports current alcohol use. He reports that he does not use drugs.   No Known Allergies  Family History  Problem Relation Age of Onset   Lung cancer Mother 44     Prior to Admission medications   Medication Sig Start Date End Date Taking? Authorizing Provider  acetaminophen (TYLENOL) 325 MG tablet Take 1-2 tablets (325-650 mg total) by mouth every 4 (four) hours as needed. Patient taking differently: Take 325-650 mg by mouth every 4 (four) hours as needed for  mild pain or headache.  08/02/12  Yes Love, Ivan Anchors, PA-C  aspirin EC 81 MG tablet Take 81 mg by mouth daily after breakfast.    Yes [provider]  Calcium Carbonate (CALCIUM-CARB 600 PO) Take 600 mg by mouth daily after breakfast.    Yes [provider]  gabapentin (NEURONTIN) 100 MG capsule Take 2 capsules (200 mg total) by mouth 3 (three) times daily as needed (for pain). 05/12/18  Yes Michael Boston, MD   meloxicam (MOBIC) 15 MG tablet Take 15 mg by mouth daily.   Yes [provider]  Multiple Vitamin (MULTIVITAMIN WITH MINERALS) TABS Take 1 tablet by mouth daily after breakfast.    Yes [provider]  psyllium (METAMUCIL) 0.52 G capsule Take 1.04 g by mouth daily after breakfast.    Yes [provider]  traMADol (ULTRAM) 50 MG tablet Take 1-2 tablets (50-100 mg total) by mouth every 6 (six) hours as needed for moderate pain or severe pain. Patient not taking: Reported on 10/21/2018 05/12/18   Michael Boston, MD   Physical Exam: Vitals:   10/21/18 1615 10/21/18 1630 10/21/18 1635 10/21/18 1724  BP:      Pulse: 84 89 84 85  Resp:    18  Temp:      TempSrc:      SpO2: 95% 93% 96% 96%  Weight:      Height:         General exam: Moderately built and nourished patient, lying comfortably supine on the gurney in no obvious distress.  G-tube in place  Head, eyes and ENT: Nontraumatic and normocephalic. Pupils equally reacting to light and accommodation. Oral mucosa moist.  Neck: Supple. No JVD, carotid bruit or thyromegaly.  Lymphatics: No lymphadenopathy.  Respiratory system: Clear to auscultation. No increased work of breathing.  Cardiovascular system: S1 and S2 heard, RRR. No JVD, murmurs, gallops, clicks or pedal edema.  Gastrointestinal system: Abdomen is tender, obese, no significant tenderness to palpation  Central nervous system: Alert and oriented. No focal neurological deficits.  Extremities: Symmetric 5 x 5 power. Peripheral pulses symmetrically felt.   Skin: No rashes or acute findings.  Musculoskeletal system: Negative exam.  Psychiatry: Pleasant and cooperative.   Labs on Admission:  Basic Metabolic Panel: Recent Labs  Lab 10/21/18 1417  NA 135  K 4.2  CL 94*  CO2 25  GLUCOSE 495*  BUN 43*  CREATININE 1.82*  CALCIUM 9.2   Liver Function Tests: Recent Labs  Lab 10/21/18 1417  AST 23  ALT 20  ALKPHOS 117  BILITOT 1.8*    PROT 8.2*  ALBUMIN 4.7   Recent Labs  Lab 10/21/18 1417  LIPASE 43   No results for input(s): AMMONIA in the last 168 hours. CBC: Recent Labs  Lab 10/21/18 1417  WBC 12.1*  HGB 16.1  HCT 47.9  MCV 91.6  PLT 247   Cardiac Enzymes: No results for input(s): CKTOTAL, CKMB, CKMBINDEX, TROPONINI in the last 168 hours.  BNP (last 3 results) No results for input(s): PROBNP in the last 8760 hours. CBG: No results for input(s): GLUCAP in the last 168 hours.  Radiological Exams on Admission: Ct Abdomen Pelvis Wo Contrast  Result Date: 10/21/2018 CLINICAL DATA:  Abdominal distension, emesis. EXAM: CT ABDOMEN AND PELVIS WITHOUT CONTRAST TECHNIQUE: Multidetector CT imaging of the abdomen and pelvis was performed following the standard protocol without IV contrast. COMPARISON:  CT scan of November 04, 2016. FINDINGS: Lower chest: No acute abnormality. Hepatobiliary: Cholelithiasis is  noted. No biliary dilatation is noted. No focal abnormality is seen in the liver on these unenhanced images. Pancreas: Unremarkable. No pancreatic ductal dilatation or surrounding inflammatory changes. Spleen: Normal in size without focal abnormality. Adrenals/Urinary Tract: Adrenal glands appear normal. Stable bilateral renal cysts are noted. No hydronephrosis or renal obstruction is noted. Small staghorn type calculus is noted in lower pole collecting system of left kidney. Urinary bladder is unremarkable. Stomach/Bowel: The stomach appears normal. No colonic dilatation is noted. Postsurgical changes and diverticulosis is seen involving the sigmoid colon. Large amount of stool is seen in the rectum concerning for impaction. Small bowel dilatation is noted concerning for distal small bowel obstruction. That may be due to a hernia in the left lower quadrant which contains several loops of small bowel. The appendix appears normal. Vascular/Lymphatic: Aortic atherosclerosis. No enlarged abdominal or pelvic lymph nodes.  Reproductive: Status post prostatectomy. Other: No abnormal fluid collection is noted. Musculoskeletal: No acute or significant osseous findings. IMPRESSION: Findings consistent with distal small bowel obstruction most likely due to hernia in left lower quadrant of the abdomen. Small staghorn type calculus is noted in lower pole collecting system of left kidney. No hydronephrosis or renal obstruction is noted. Cholelithiasis without inflammation. Sigmoid diverticulosis without inflammation. Aortic Atherosclerosis (ICD10-I70.0). Electronically Signed   By: Marijo Conception M.D.   On: 10/21/2018 16:29    EKG: will order EKG  Assessment/Plan Active Problems:   Diabetes mellitus type 2, uncontrolled (HCC)   Bowel obstruction (HCC)   AKI (acute kidney injury) ([place])   DKA, type 2 ([place])  1-Distal a small bowel obstruction: Probably related to hernia in the left lower abdomen -CT abdomen Pelvis; Findings consistent with distal small bowel obstruction most likely due to hernia in left lower quadrant of the abdomen -Admit to the hospital, start IV fluids, n.p.o. status.  NG tube to be placed. -Surgery consulted. -IV fentanyl PRN for pain. -IV Protonix for GI prophylaxis.  2-Diabetes, hyperosmolar hyperglycemic state Patient presented with a CBG of 495, CO2 at 25, anion gap 16, urine with mild ketones.  Awaiting repeated CBG. will probably start sliding scale insulin if CBG has decreased after IV fluids  3-AKI; Likely related to hypovolemia, prerenal. Patient presented with a creatinine at 1.8, BUN 43. Last creatinine per records at 1.1 on January 2020. Continue with IV fluids. Repeat labs in the morning.  4-increased anion gap acidosis: Present with hyperglycemia, but his CO2 is at 25 less likely DKA. Acidosis could be related to AKI versus related to the bowel obstruction. IV fluids. Trend lactic acid. Repeat be met.    5-Routine Covid test pending.  I used face shield , surgical mass  during encounter  6-leukocytosis; SPECT related to #1, de margination. Follow trend.  Elevated BP;  Monitor PRN Hydralazine.   DVT Prophylaxis: SCD Code Status: Full code, he would not want to be for long period of time on life support.  Family Communication: Wife was on the phone during my evaluation with the patient Disposition Plan: Patient will be admitted to the hospital for IV fluid treatment of hyperglycemia, treatment of bowel obstruction, he will need NG tube, IV fluids.  He might require surgery during this admission.  Time spent: 75 minutes.   Elmarie Shiley MD [facility]   10/21/2018, 5:45 PM

## 2018-10-21 NOTE — ED Triage Notes (Signed)
Patient reports emesis x 5 for the past 2 days. Patient also c/o chills. Patient states he has been having indigestion as well. Patient denies abdominal pain or diarrhea.

## 2018-10-21 NOTE — ED Notes (Signed)
Admitting provider at bedside.

## 2018-10-21 NOTE — ED Provider Notes (Signed)
[facility] DEPT Provider Note   CSN: [id number] Arrival date & time: 10/21/18  1357    History   Chief Complaint Chief Complaint  Patient presents with   Emesis   Chills    HPI Billy Horn is a 73 y.o. male.     The history is provided by the patient.  Emesis Severity:  Mild Timing:  Constant Quality:  Stomach contents Able to tolerate:  Liquids Progression:  Unchanged Chronicity:  New Recent urination:  Normal Relieved by:  Nothing Worsened by:  Nothing Associated symptoms: abdominal pain (bloating)   Associated symptoms: no arthralgias, no chills, no cough, no diarrhea, no fever and no sore throat   Risk factors: prior abdominal surgery     Past Medical History:  Diagnosis Date   Anemia    Cancer ([place]) 2012   prostate cancer   Colonic diverticular abscess 10/27/2016   Diabetes mellitus    controlled with diet and exercise   Liver abscess    Pneumonia    as a child    Patient Active Problem List   Diagnosis Date Noted   Recurrent ventral incisional hernia s/p lap repair w mesh 05/12/2018 05/12/2018   Diverticular disease 01/14/2017   Diverticulitis with abscess s/p robotic sigmoid colectomy 01/14/2017 10/01/2016   Anemia 09/28/2016   Diabetes mellitus type 2, uncontrolled ([place]) 07/27/2012   Narcotic induced constipation 07/27/2012   Tibial plateau fracture 07/27/2012    Past Surgical History:  Procedure Laterality Date   COLONOSCOPY WITH PROPOFOL N/A 11/09/2016   Procedure: COLONOSCOPY WITH PROPOFOL;  Surgeon: Laurence Spates, MD;  Location: [facility];  Service: Endoscopy;  Laterality: N/A;   INSERTION OF MESH N/A 05/12/2018   Procedure: INSERTION OF MESH;  Surgeon: Michael Boston, MD;  [facility];  Service: General;  Laterality: N/A;   IR RADIOLOGIST EVAL & MGMT  11/04/2016   ORIF TIBIA PLATEAU Left 07/23/2012   Procedure: OPEN REDUCTION INTERNAL FIXATION (ORIF) TIBIAL PLATEAU;  Surgeon: Sharmon Revere, MD;  [facility];  Service: Orthopedics;  Laterality: Left;   ROBOT ASSISTED LAPAROSCOPIC RADICAL PROSTATECTOMY  04/06/2011   Procedure: ROBOTIC ASSISTED LAPAROSCOPIC RADICAL PROSTATECTOMY LEVEL 2;  Surgeon: Dutch Gray, MD;  [facility];  Service: Urology;  Laterality: N/A;  Bilateral Pelvic Lymphadenectomy    TONSILLECTOMY     as a child   VENTRAL HERNIA REPAIR N/A 05/12/2018   Procedure: LAPAROSCOPIC  COMPARTMENT SEPARATION INCISIONAL HERNIA WITH MESH;  Surgeon: Michael Boston, MD;  [facility];  Service: General;  Laterality: N/A;        Home Medications    Prior to Admission medications   Medication Sig Start Date End Date Taking? Authorizing Provider  acetaminophen (TYLENOL) 325 MG tablet Take 1-2 tablets (325-650 mg total) by mouth every 4 (four) hours as needed. Patient taking differently: Take 325-650 mg by mouth every 4 (four) hours as needed for mild pain or headache.  08/02/12  Yes Love, Ivan Anchors, PA-C  aspirin EC 81 MG tablet Take 81 mg by mouth daily after breakfast.    Yes [provider]  Calcium Carbonate (CALCIUM-CARB 600 PO) Take 600 mg by mouth daily after breakfast.    Yes [provider]  gabapentin (NEURONTIN) 100 MG capsule Take 2 capsules (200 mg total) by mouth 3 (three) times daily as needed (for pain). 05/12/18  Yes Michael Boston, MD  meloxicam (MOBIC) 15 MG tablet Take 15 mg by mouth daily.   Yes [provider]  Multiple Vitamin (MULTIVITAMIN WITH MINERALS) TABS Take 1 tablet by mouth daily after breakfast.    Yes [provider]  psyllium (METAMUCIL) 0.52 G capsule Take 1.04 g by mouth daily after breakfast.    Yes [provider]  traMADol (ULTRAM) 50 MG tablet Take 1-2 tablets (50-100 mg total) by mouth every 6 (six) hours as needed for moderate pain or severe pain. Patient not taking: Reported on 10/21/2018 05/12/18   Michael Boston, MD    Family History Family History  Problem Relation Age  of Onset   Lung cancer Mother 31    Social History Social History   Tobacco Use   Smoking status: Never Smoker   Smokeless tobacco: Never Used  Substance Use Topics   Alcohol use: Yes    Comment: occas wine   Drug use: No     Allergies   Patient has no known allergies.   Review of Systems Review of Systems  Constitutional: Negative for chills and fever.  HENT: Negative for ear pain and sore throat.   Eyes: Negative for pain and visual disturbance.  Respiratory: Negative for cough and shortness of breath.   Cardiovascular: Negative for chest pain and palpitations.  Gastrointestinal: Positive for abdominal distention, abdominal pain (bloating), nausea and vomiting. Negative for constipation and diarrhea.  Genitourinary: Negative for dysuria and hematuria.  Musculoskeletal: Negative for arthralgias and back pain.  Skin: Negative for color change and rash.  Neurological: Negative for seizures and syncope.  All other systems reviewed and are negative.    Physical Exam Updated Vital Signs BP (!) 153/87    Pulse 84    Temp 97.7 F (36.5 C) (Oral)    Resp 18    Ht 6' (1.829 m)    Wt 90.7 kg    SpO2 96%    BMI 27.12 kg/m   Physical Exam Vitals signs and nursing note reviewed.  Constitutional:      General: He is not in acute distress.    Appearance: He is well-developed. He is not ill-appearing.  HENT:     Head: Normocephalic and atraumatic.     Mouth/Throat:     Mouth: Mucous membranes are moist.  Eyes:     Extraocular Movements: Extraocular movements intact.     Conjunctiva/sclera: Conjunctivae normal.     Pupils: Pupils are equal, round, and reactive to light.  Neck:     Musculoskeletal: Normal range of motion and neck supple.  Cardiovascular:     Rate and Rhythm: Normal rate and regular rhythm.     Heart sounds: No murmur.  Pulmonary:     Effort: Pulmonary effort is normal. No respiratory distress.     Breath sounds: Normal breath sounds.  Abdominal:      General: There is distension.     Palpations: Abdomen is soft.     Tenderness: There is no abdominal tenderness. There is no guarding or rebound.  Musculoskeletal: Normal range of motion.  Skin:    General: Skin is warm and dry.     Capillary Refill: Capillary refill takes less than 2 seconds.  Neurological:     General: No focal deficit present.     Mental Status: He is alert.  Psychiatric:        Mood and Affect: Mood normal.      ED Treatments / Results  Labs (all labs ordered are listed, but only abnormal results are displayed) Labs Reviewed  COMPREHENSIVE METABOLIC PANEL - Abnormal; Notable for the following components:  Result Value   Chloride 94 (*)    Glucose, Bld 495 (*)    BUN 43 (*)    Creatinine, Ser 1.82 (*)    Total Protein 8.2 (*)    Total Bilirubin 1.8 (*)    GFR calc non Af Amer 36 (*)    GFR calc Af Amer 42 (*)    Anion gap 16 (*)    All other components within normal limits  CBC - Abnormal; Notable for the following components:   WBC 12.1 (*)    All other components within normal limits  URINALYSIS, ROUTINE W REFLEX MICROSCOPIC - Abnormal; Notable for the following components:   Specific Gravity, Urine 1.037 (*)    Glucose, UA >=500 (*)    Hgb urine dipstick LARGE (*)    Ketones, ur 5 (*)    Protein, ur 30 (*)    RBC / HPF >50 (*)    All other components within normal limits  SARS CORONAVIRUS 2 (HOSPITAL ORDER, [facility])  LIPASE, BLOOD  HEMOGLOBIN A1C  CBG MONITORING, ED    EKG None  Radiology Ct Abdomen Pelvis Wo Contrast  Result Date: 10/21/2018 CLINICAL DATA:  Abdominal distension, emesis. EXAM: CT ABDOMEN AND PELVIS WITHOUT CONTRAST TECHNIQUE: Multidetector CT imaging of the abdomen and pelvis was performed following the standard protocol without IV contrast. COMPARISON:  CT scan of November 04, 2016. FINDINGS: Lower chest: No acute abnormality. Hepatobiliary: Cholelithiasis is noted. No biliary dilatation is  noted. No focal abnormality is seen in the liver on these unenhanced images. Pancreas: Unremarkable. No pancreatic ductal dilatation or surrounding inflammatory changes. Spleen: Normal in size without focal abnormality. Adrenals/Urinary Tract: Adrenal glands appear normal. Stable bilateral renal cysts are noted. No hydronephrosis or renal obstruction is noted. Small staghorn type calculus is noted in lower pole collecting system of left kidney. Urinary bladder is unremarkable. Stomach/Bowel: The stomach appears normal. No colonic dilatation is noted. Postsurgical changes and diverticulosis is seen involving the sigmoid colon. Large amount of stool is seen in the rectum concerning for impaction. Small bowel dilatation is noted concerning for distal small bowel obstruction. That may be due to a hernia in the left lower quadrant which contains several loops of small bowel. The appendix appears normal. Vascular/Lymphatic: Aortic atherosclerosis. No enlarged abdominal or pelvic lymph nodes. Reproductive: Status post prostatectomy. Other: No abnormal fluid collection is noted. Musculoskeletal: No acute or significant osseous findings. IMPRESSION: Findings consistent with distal small bowel obstruction most likely due to hernia in left lower quadrant of the abdomen. Small staghorn type calculus is noted in lower pole collecting system of left kidney. No hydronephrosis or renal obstruction is noted. Cholelithiasis without inflammation. Sigmoid diverticulosis without inflammation. Aortic Atherosclerosis (ICD10-I70.0). Electronically Signed   By: Marijo Conception M.D.   On: 10/21/2018 16:29    Procedures Procedures (including critical care time)  Medications Ordered in ED Medications  lidocaine (XYLOCAINE) 2 % viscous mouth solution 15 mL (has no administration in time range)  sodium chloride flush (NS) 0.9 % injection 3 mL (3 mLs Intravenous Given 10/21/18 1448)  sodium chloride 0.9 % bolus 1,000 mL (0 mLs Intravenous  Stopped 10/21/18 1546)  ondansetron (ZOFRAN) injection 4 mg (4 mg Intravenous Given 10/21/18 1449)  sodium chloride 0.9 % bolus 1,000 mL (1,000 mLs Intravenous New Bag/Given 10/21/18 1613)     Initial Impression / Assessment and Plan / ED Course  I have reviewed the triage vital signs and the nursing notes.  Pertinent  labs & imaging results that were available during my care of the patient were reviewed by me and considered in my medical decision making (see chart for details).     Billy Horn is a 73 year old male with history of prostate cancer, multiple abdominal surgeries who presents to the ED with emesis, abdominal distention.  Symptoms started last night.  Patient with abdominal distention on exam.  No focal tenderness.  Has had multiple rounds of vomiting.  Has not passed gas or had a bowel movement.  Concern for small bowel obstruction.  CT scan showed small bowel obstruction likely due to hernia.  Dr. Ninfa Linden with general surgery was called and recommends NG placement.  He will consult.  Patient was found to have an acute kidney injury, hyperglycemia to about 500.  Mild elevation in anion gap.  However, CO2 normal.  Patient was given 2 L of normal saline.  Given IV Zofran.  To be admitted to medicine given hyperglycemia and AKI.  General surgery to consult and give recommendations.  Patient to be kept n.p.o.  Otherwise hemodynamically stable throughout my care.  Admitted in stable condition.  This chart was dictated using voice recognition software.  Despite best efforts to proofread,  errors can occur which can change the documentation meaning.    Final Clinical Impressions(s) / ED Diagnoses   Final diagnoses:  SBO (small bowel obstruction) ([place])  Hyperglycemia  AKI (acute kidney injury) [facility])    ED Discharge Orders    None       Lennice Sites, DO 10/21/18 1727

## 2018-10-21 NOTE — Consult Note (Signed)
Reason for Consult:SBO Referring Physician: Dr. Niel Hummer  Billy Horn is an 73 y.o. male.  HPI: This is a pleasant 73 year old gentleman who is status post a laparoscopic incisional hernia repair with mesh with component separation by Dr. Johney Maine in January of this year.  At that time, a 33 cm x 27 cm piece of mesh was placed for a large fascial defect.  He presented to the emergency room today with a 2-day history of nausea and vomiting.  He reported no abdominal pain.  He reports he has had very little bowel movements.  He has had a decreased appetite.  He is otherwise without complaints.  He was found to have an elevated white blood count as well as a blood glucose of 495, and a CT scan without contrast was suggested a small bowel obstruction and a recurrent hernia.  He is also found to have some mild renal failure.  Again, he currently denies abdominal pain.  Past Medical History:  Diagnosis Date  . Anemia   . Cancer [facility]) 2012   prostate cancer  . Colonic diverticular abscess 10/27/2016  . Diabetes mellitus    controlled with diet and exercise  . Liver abscess   . Pneumonia    as a child    Past Surgical History:  Procedure Laterality Date  . COLONOSCOPY WITH PROPOFOL N/A 11/09/2016   Procedure: COLONOSCOPY WITH PROPOFOL;  Surgeon: Laurence Spates, MD;  Location: [place];  Service: Endoscopy;  Laterality: N/A;  . INSERTION OF MESH N/A 05/12/2018   Procedure: INSERTION OF MESH;  Surgeon: Michael Boston, MD;  Location: [facility];  Service: General;  Laterality: N/A;  . IR RADIOLOGIST EVAL & MGMT  11/04/2016  . ORIF TIBIA PLATEAU Left 07/23/2012   Procedure: OPEN REDUCTION INTERNAL FIXATION (ORIF) TIBIAL PLATEAU;  Surgeon: Sharmon Revere, MD;  Location: [facility];  Service: Orthopedics;  Laterality: Left;  . ROBOT ASSISTED LAPAROSCOPIC RADICAL PROSTATECTOMY  04/06/2011   Procedure: ROBOTIC ASSISTED LAPAROSCOPIC RADICAL PROSTATECTOMY LEVEL 2;  Surgeon: Dutch Gray, MD;  Location[facility];   Service: Urology;  Laterality: N/A;  Bilateral Pelvic Lymphadenectomy   . TONSILLECTOMY     as a child  . VENTRAL HERNIA REPAIR N/A 05/12/2018   Procedure: LAPAROSCOPIC  COMPARTMENT SEPARATION INCISIONAL HERNIA WITH MESH;  Surgeon: Michael Boston, MD;  Location[facility];  Service: General;  Laterality: N/A;    Family History  Problem Relation Age of Onset  . Lung cancer Mother 68    Social History:  reports that he has never smoked. He has never used smokeless tobacco. He reports current alcohol use. He reports that he does not use drugs.  Allergies: No Known Allergies  Medications: I have reviewed the patient's current medications.  Results for orders placed or performed during the hospital encounter of 10/21/18 (from the past 48 hour(s))  Lipase, blood     Status: None   Collection Time: 10/21/18  2:17 PM  Result Value Ref Range   Lipase 43 11 - 51 U/L    Comment: Performed at [facility], [place] [address], [place], [place] [address]  Comprehensive metabolic panel     Status: Abnormal   Collection Time: 10/21/18  2:17 PM  Result Value Ref Range   Sodium 135 135 - 145 mmol/L   Potassium 4.2 3.5 - 5.1 mmol/L   Chloride 94 (L) 98 - 111 mmol/L   CO2 25 22 - 32 mmol/L   Glucose, Bld 495 (H) 70 - 99  mg/dL   BUN 43 (H) 8 - 23 mg/dL   Creatinine, Ser 1.82 (H) 0.61 - 1.24 mg/dL   Calcium 9.2 8.9 - 10.3 mg/dL   Total Protein 8.2 (H) 6.5 - 8.1 g/dL   Albumin 4.7 3.5 - 5.0 g/dL   AST 23 15 - 41 U/L   ALT 20 0 - 44 U/L   Alkaline Phosphatase 117 38 - 126 U/L   Total Bilirubin 1.8 (H) 0.3 - 1.2 mg/dL   GFR calc non Af Amer 36 (L) >60 mL/min   GFR calc Af Amer 42 (L) >60 mL/min   Anion gap 16 (H) 5 - 15    Comment: Performed at [facility], [place] [address], [place], [place] [address]  CBC     Status: Abnormal   Collection Time: 10/21/18  2:17 PM  Result Value Ref Range   WBC 12.1 (H) 4.0 - 10.5 K/uL   RBC 5.23 4.22 - 5.81 MIL/uL   Hemoglobin  16.1 13.0 - 17.0 g/dL   HCT 47.9 39.0 - 52.0 %   MCV 91.6 80.0 - 100.0 fL   MCH 30.8 26.0 - 34.0 pg   MCHC 33.6 30.0 - 36.0 g/dL   RDW 13.4 11.5 - 15.5 %   Platelets 247 150 - 400 K/uL   nRBC 0.0 0.0 - 0.2 %    Comment: Performed at [facility], [place] [address], [place], [place] [address]  Urinalysis, Routine w reflex microscopic     Status: Abnormal   Collection Time: 10/21/18  2:17 PM  Result Value Ref Range   Color, Urine YELLOW YELLOW   APPearance CLEAR CLEAR   Specific Gravity, Urine 1.037 (H) 1.005 - 1.030   pH 5.0 5.0 - 8.0   Glucose, UA >=500 (A) NEGATIVE mg/dL   Hgb urine dipstick LARGE (A) NEGATIVE   Bilirubin Urine NEGATIVE NEGATIVE   Ketones, ur 5 (A) NEGATIVE mg/dL   Protein, ur 30 (A) NEGATIVE mg/dL   Nitrite NEGATIVE NEGATIVE   Leukocytes,Ua NEGATIVE NEGATIVE   RBC / HPF >50 (H) 0 - 5 RBC/hpf   WBC, UA 0-5 0 - 5 WBC/hpf   Bacteria, UA NONE SEEN NONE SEEN   Mucus PRESENT    Hyaline Casts, UA PRESENT     Comment: Performed at [facility], [place] [address] [place], [place] [address]  Hemoglobin A1c     Status: Abnormal   Collection Time: 10/21/18  2:17 PM  Result Value Ref Range   Hgb A1c MFr Bld 8.8 (H) 4.8 - 5.6 %    Comment: (NOTE) Pre diabetes:          5.7%-6.4% Diabetes:              >6.4% Glycemic control for   <7.0% adults with diabetes    Mean Plasma Glucose 205.86 mg/dL    Comment: Performed at [facility], [place] [address], [place],  [address]  POC CBG, ED     Status: Abnormal   Collection Time: 10/21/18  5:56 PM  Result Value Ref Range   Glucose-Capillary 355 (H) 70 - 99 mg/dL    Ct Abdomen Pelvis Wo Contrast  Result Date: 10/21/2018 CLINICAL DATA:  Abdominal distension, emesis. EXAM: CT ABDOMEN AND PELVIS WITHOUT CONTRAST TECHNIQUE: Multidetector CT imaging of the abdomen and pelvis was performed following the standard protocol without IV contrast. COMPARISON:  CT scan of November 04, 2016.  FINDINGS: Lower chest: No acute abnormality. Hepatobiliary: Cholelithiasis is noted.  No biliary dilatation is noted. No focal abnormality is seen in the liver on these unenhanced images. Pancreas: Unremarkable. No pancreatic ductal dilatation or surrounding inflammatory changes. Spleen: Normal in size without focal abnormality. Adrenals/Urinary Tract: Adrenal glands appear normal. Stable bilateral renal cysts are noted. No hydronephrosis or renal obstruction is noted. Small staghorn type calculus is noted in lower pole collecting system of left kidney. Urinary bladder is unremarkable. Stomach/Bowel: The stomach appears normal. No colonic dilatation is noted. Postsurgical changes and diverticulosis is seen involving the sigmoid colon. Large amount of stool is seen in the rectum concerning for impaction. Small bowel dilatation is noted concerning for distal small bowel obstruction. That may be due to a hernia in the left lower quadrant which contains several loops of small bowel. The appendix appears normal. Vascular/Lymphatic: Aortic atherosclerosis. No enlarged abdominal or pelvic lymph nodes. Reproductive: Status post prostatectomy. Other: No abnormal fluid collection is noted. Musculoskeletal: No acute or significant osseous findings. IMPRESSION: Findings consistent with distal small bowel obstruction most likely due to hernia in left lower quadrant of the abdomen. Small staghorn type calculus is noted in lower pole collecting system of left kidney. No hydronephrosis or renal obstruction is noted. Cholelithiasis without inflammation. Sigmoid diverticulosis without inflammation. Aortic Atherosclerosis (ICD10-I70.0). Electronically Signed   By: Marijo Conception M.D.   On: 10/21/2018 16:29    Review of Systems  Constitutional: Negative for chills and fever.  Respiratory: Negative for cough and shortness of breath.   Cardiovascular: Negative for chest pain.  Gastrointestinal: Positive for constipation, nausea  and vomiting. Negative for abdominal pain and diarrhea.  Genitourinary: Negative for dysuria.  Musculoskeletal: Negative for myalgias.   Blood pressure (!) 162/92, pulse 89, temperature 97.7 F (36.5 C), temperature source Oral, resp. rate 18, height 6' (1.829 m), weight 90.7 kg, SpO2 96 %. Physical Exam  Constitutional: He is oriented to person, place, and time. He appears well-developed and well-nourished. No distress.  HENT:  Head: Normocephalic and atraumatic.  Right Ear: External ear normal.  Left Ear: External ear normal.  Nose: Nose normal.  Eyes: Pupils are equal, round, and reactive to light. Conjunctivae are normal. Left eye exhibits no discharge. No scleral icterus.  Neck: Normal range of motion. No tracheal deviation present.  Cardiovascular: Normal rate, regular rhythm, normal heart sounds and intact distal pulses.  No murmur heard. Respiratory: Effort normal and breath sounds normal.  GI: Soft. There is no abdominal tenderness. There is no guarding.  He has a large, obese abdomen.  I have a difficult time appreciating an incisional hernia.  Again, his abdomen is nontender  Musculoskeletal: Normal range of motion.        General: No deformity or edema.  Lymphadenopathy:    He has no cervical adenopathy.  Neurological: He is alert and oriented to person, place, and time.  Skin: Skin is warm and dry. No erythema.  Psychiatric: His behavior is normal. Judgment normal.    Assessment/Plan: Small bowel obstruction  I reviewed the CT scan of the abdomen and pelvis.  It is difficult to tell whether this is a true hernia or just a large laxity in the abdominal wall of the lower abdomen.  Again, the CAT scan was without contrast.  He does have a large amount of stool in his colon as well as the rectal vault.  A nasogastric tube is been placed.  I have ordered the small bowel protocol to see if this will help with the diagnosis.  He needs IV  rehydration and improvement of his blood  glucose.  I will order a suppository to help with the constipation as well.  We will follow him closely with you.  Coralie Keens 10/21/2018, 6:57 PM

## 2018-10-21 NOTE — ED Notes (Signed)
Report called to [place], RN

## 2018-10-21 NOTE — ED Notes (Signed)
Patient transported to CT 

## 2018-10-21 NOTE — ED Notes (Signed)
RN spoke with ICU Nurse. ICU Nurse is going to check to see if patient can be downgraded to Tele instead of ICU / ICU stepdown.

## 2018-10-22 ENCOUNTER — Inpatient Hospital Stay (HOSPITAL_COMMUNITY): Payer: PPO

## 2018-10-22 DIAGNOSIS — N179 Acute kidney failure, unspecified: Secondary | ICD-10-CM

## 2018-10-22 LAB — BASIC METABOLIC PANEL
Anion gap: 9 (ref 5–15)
BUN: 32 mg/dL — ABNORMAL HIGH (ref 8–23)
CO2: 26 mmol/L (ref 22–32)
Calcium: 8.3 mg/dL — ABNORMAL LOW (ref 8.9–10.3)
Chloride: 104 mmol/L (ref 98–111)
Creatinine, Ser: 1.32 mg/dL — ABNORMAL HIGH (ref 0.61–1.24)
GFR calc Af Amer: >60 mL/min (ref >60)
GFR calc non Af Amer: 53 mL/min — ABNORMAL LOW (ref >60)
Glucose, Bld: 276 mg/dL — ABNORMAL HIGH (ref 70–99)
Potassium: 4.2 mmol/L (ref 3.5–5.1)
Sodium: 139 mmol/L (ref 135–145)

## 2018-10-22 LAB — CBC
HCT: 42.1 % (ref 39.0–52.0)
Hemoglobin: 13.6 g/dL (ref 13.0–17.0)
MCH: 30.7 pg (ref 26.0–34.0)
MCHC: 32.3 g/dL (ref 30.0–36.0)
MCV: 95 fL (ref 80.0–100.0)
Platelets: 187 10*3/uL (ref 150–400)
RBC: 4.43 MIL/uL (ref 4.22–5.81)
RDW: 13.6 % (ref 11.5–15.5)
WBC: 8 10*3/uL (ref 4.0–10.5)
nRBC: 0 % (ref 0.0–0.2)

## 2018-10-22 LAB — GLUCOSE, CAPILLARY
Glucose-Capillary: 257 mg/dL — ABNORMAL HIGH (ref 70–99)
Glucose-Capillary: 196 mg/dL — ABNORMAL HIGH (ref 70–99)
Glucose-Capillary: 148 mg/dL — ABNORMAL HIGH (ref 70–99)
Glucose-Capillary: 118 mg/dL — ABNORMAL HIGH (ref 70–99)

## 2018-10-22 LAB — LACTIC ACID, PLASMA: Lactic Acid, Venous: 1.8 mmol/L (ref 0.5–1.9)

## 2018-10-22 MED ORDER — ENOXAPARIN SODIUM 40 MG/0.4ML ~~LOC~~ SOLN
40.0000 mg | SUBCUTANEOUS | Status: DC
  Administered 2018-10-22 – 2018-10-25 (×4): 40 mg via SUBCUTANEOUS
  Filled 2018-10-22 (×4): qty 0.4

## 2018-10-22 MED ORDER — SODIUM CHLORIDE 0.9 % IV SOLN
3.0000 g | Freq: Four times a day (QID) | INTRAVENOUS | Status: DC
  Administered 2018-10-22 – 2018-10-24 (×8): 3 g via INTRAVENOUS
  Filled 2018-10-22: qty 8
  Filled 2018-10-22 (×8): qty 3

## 2018-10-22 NOTE — Progress Notes (Signed)
PROGRESS NOTE    Billy Horn  KXF:[id number] DOB: 07/21/1945 DOA: 10/21/2018 PCP: Alroy Dust, L.Marlou Sa, MD   Brief Narrative: 73 year old with past medical history significant for prostate cancer diagnosed 2012, diabetes type 2, prior history of colonic diverticular abscess and liver abscess, ventral hernia with mesh repair in January 2020 who presents complaining of 2 days vomiting and nausea.  He reports a very small bowel movement.  Evaluation in the ED he was found to be in AKI, uncontrolled diabetes and a CT abdomen and pelvis show distal small bowel obstruction likely due to hernia in the left lower quadrant.    Assessment & Plan:   Active Problems:   Diabetes mellitus type 2, uncontrolled (HCC)   Bowel obstruction (HCC)   AKI (acute kidney injury) ([place])   DKA, type 2 (HCC)   SBO (small bowel obstruction) ([place])   1-Distal small bowel obstruction -CT abdomen Pelvis; Findings consistent with distal small bowel obstruction most likely due to hernia in left lower quadrant of the abdomen -Appreciate Dr Ninfa Linden help.  -Continue with NG tube 400 cc in canister.  -Continue with IV fluids.  -IV protonix.  -Continue with small bowel protocol.   2-Diabetes hyperosmolar hyperglycemic state Continue with SSI. Patient NPO.  Monitor CBG.  HBA1c; 8.8. will need medications at discharge.   3-AKI; pre-renal.  Patient presented with a creatinine at 1.8, BUN 43. Last creatinine per records at 1.1 on January 2020. Continue with IV fluids.  Improving.   4-Increased anion gap acidosis: Resolved, likely related to AKI.  5-Hypertension; Continue with PRN hydralazine.   6-screening for COVID-19 negative.  7-Leukocytosis, Fever; chest x ray with atelectasis, consolidation. Will add unasyn to cover for PNA.    Estimated body mass index is 27.48 kg/m as calculated from the following:   Height as of this encounter: 6' (1.829 m).   Weight as of this encounter: 91.9 kg.   DVT  prophylaxis:  Start lovenox Code Status: full code Family Communication: patient relates he will update his wife. Decline I called her.  Disposition Plan: Transfer to med-surgery  Consultants:   Surgery   Procedures:   none  Antimicrobials:  Unasyn 7-04  Subjective: Mild cough.  Report feeling better, less abdominal distension. Had BM after suppository.    Objective: Vitals:   10/22/18 0200 10/22/18 0300 10/22/18 0425 10/22/18 0500  BP: 133/61 (!) 146/85  (!) 169/63  Pulse: 91 83  80  Resp: (!) 21 20  20   Temp:   97.9 F (36.6 C)   TempSrc:   Oral   SpO2: 94% 95%  96%  Weight:    91.9 kg  Height:        Intake/Output Summary (Last 24 hours) at 10/22/2018 0657 Last data filed at 10/22/2018 0500 Gross per 24 hour  Intake 792.95 ml  Output 825 ml  Net -32.05 ml   Filed Weights   10/21/18 1413 10/22/18 0500  Weight: 90.7 kg 91.9 kg    Examination:  General exam: Appears calm and comfortable , NG tube in place.  Respiratory system: Clear to auscultation. Respiratory effort normal. Cardiovascular system: S1 & S2 heard, RRR. No JVD, murmurs, rubs, gallops or clicks. No pedal edema. Gastrointestinal system: Abdomen is distended, no tenderness.  Central nervous system: Alert and oriented. No focal neurological deficits. Extremities: Symmetric 5 x 5 power. Skin: No rashes, lesions or ulcers Psychiatry: Judgement and insight appear normal. Mood & affect appropriate.     Data Reviewed: I have personally reviewed following  labs and imaging studies  CBC: Recent Labs  Lab 10/21/18 1417  WBC 12.1*  HGB 16.1  HCT 47.9  MCV 91.6  PLT 678   Basic Metabolic Panel: Recent Labs  Lab 10/21/18 1417 10/21/18 1912  NA 135 138  K 4.2 4.1  CL 94* 100  CO2 25 29  GLUCOSE 495* 372*  BUN 43* 40*  CREATININE 1.82* 1.56*  CALCIUM 9.2 8.5*   GFR: Estimated Creatinine Clearance: 46.3 mL/min (A) (by C-G formula based on SCr of 1.56 mg/dL (H)). Liver Function  Tests: Recent Labs  Lab 10/21/18 1417  AST 23  ALT 20  ALKPHOS 117  BILITOT 1.8*  PROT 8.2*  ALBUMIN 4.7   Recent Labs  Lab 10/21/18 1417  LIPASE 43   No results for input(s): AMMONIA in the last 168 hours. Coagulation Profile: No results for input(s): INR, PROTIME in the last 168 hours. Cardiac Enzymes: No results for input(s): CKTOTAL, CKMB, CKMBINDEX, TROPONINI in the last 168 hours. BNP (last 3 results) No results for input(s): PROBNP in the last 8760 hours. HbA1C: Recent Labs    10/21/18 1417  HGBA1C 8.8*   CBG: Recent Labs  Lab 10/21/18 1756 10/21/18 2142  GLUCAP 355* 285*   Lipid Profile: No results for input(s): CHOL, HDL, LDLCALC, TRIG, CHOLHDL, LDLDIRECT in the last 72 hours. Thyroid Function Tests: No results for input(s): TSH, T4TOTAL, FREET4, T3FREE, THYROIDAB in the last 72 hours. Anemia Panel: No results for input(s): VITAMINB12, FOLATE, FERRITIN, TIBC, IRON, RETICCTPCT in the last 72 hours. Sepsis Labs: Recent Labs  Lab 10/21/18 1912  LATICACIDVEN 2.4*    Recent Results (from the past 240 hour(s))  SARS Coronavirus 2 (CEPHEID - Performed in [place] hospital lab), Hosp Order     Status: None   Collection Time: 10/21/18  5:58 PM   Specimen: Nasopharyngeal Swab  Result Value Ref Range Status   SARS Coronavirus 2 NEGATIVE NEGATIVE Final    Comment: (NOTE) If result is NEGATIVE SARS-CoV-2 target nucleic acids are NOT DETECTED. The SARS-CoV-2 RNA is generally detectable in upper and lower  respiratory specimens during the acute phase of infection. The lowest  concentration of SARS-CoV-2 viral copies this assay can detect is 250  copies / mL. A negative result does not preclude SARS-CoV-2 infection  and should not be used as the sole basis for treatment or other  patient management decisions.  A negative result may occur with  improper specimen collection / handling, submission of specimen other  than nasopharyngeal swab, presence of viral  mutation(s) within the  areas targeted by this assay, and inadequate number of viral copies  (<250 copies / mL). A negative result must be combined with clinical  observations, patient history, and epidemiological information. If result is POSITIVE SARS-CoV-2 target nucleic acids are DETECTED. The SARS-CoV-2 RNA is generally detectable in upper and lower  respiratory specimens dur ing the acute phase of infection.  Positive  results are indicative of active infection with SARS-CoV-2.  Clinical  correlation with patient history and other diagnostic information is  necessary to determine patient infection status.  Positive results do  not rule out bacterial infection or co-infection with other viruses. If result is PRESUMPTIVE POSTIVE SARS-CoV-2 nucleic acids MAY BE PRESENT.   A presumptive positive result was obtained on the submitted specimen  and confirmed on repeat testing.  While 2019 novel coronavirus  (SARS-CoV-2) nucleic acids may be present in the submitted Horn  additional confirmatory testing may be necessary for epidemiological  and / or clinical management purposes  to differentiate between  SARS-CoV-2 and other Sarbecovirus currently known to infect humans.  If clinically indicated additional testing with an alternate test  methodology [phone]) is advised. The SARS-CoV-2 RNA is generally  detectable in upper and lower respiratory sp ecimens during the acute  phase of infection. The expected result is Negative. Fact Sheet for Patients:  StrictlyIdeas.no Fact Sheet for Healthcare Providers: BankingDealers.co.za This test is not yet approved or cleared by the [place] FDA and has been authorized for detection and/or diagnosis of SARS-CoV-2 by FDA under an Emergency Use Authorization (EUA).  This EUA will remain in effect (meaning this test can be used) for the duration of the COVID-19 declaration under Section 564(b)(1)  of the Act, 21 U.S.C. section 360bbb-3(b)(1), unless the authorization is terminated or revoked sooner. Performed at [facility], [place] [address], [place], [place] [address]   MRSA PCR Screening     Status: None   Collection Time: 10/21/18  6:47 PM   Specimen: Nasopharyngeal  Result Value Ref Range Status   MRSA by PCR NEGATIVE NEGATIVE Final    Comment:        The GeneXpert MRSA Assay (FDA approved for NASAL specimens only), is one component of a comprehensive MRSA colonization surveillance program. It is not intended to diagnose MRSA infection nor to guide or monitor treatment for MRSA infections. Performed at [facility], [place] [address] [place], [place] [address]          Radiology Studies: Ct Abdomen Pelvis Wo Contrast  Result Date: 10/21/2018 CLINICAL DATA:  Abdominal distension, emesis. EXAM: CT ABDOMEN AND PELVIS WITHOUT CONTRAST TECHNIQUE: Multidetector CT imaging of the abdomen and pelvis was performed following the standard protocol without IV contrast. COMPARISON:  CT scan of November 04, 2016. FINDINGS: Lower chest: No acute abnormality. Hepatobiliary: Cholelithiasis is noted. No biliary dilatation is noted. No focal abnormality is seen in the liver on these unenhanced images. Pancreas: Unremarkable. No pancreatic ductal dilatation or surrounding inflammatory changes. Spleen: Normal in size without focal abnormality. Adrenals/Urinary Tract: Adrenal glands appear normal. Stable bilateral renal cysts are noted. No hydronephrosis or renal obstruction is noted. Small staghorn type calculus is noted in lower pole collecting system of left kidney. Urinary bladder is unremarkable. Stomach/Bowel: The stomach appears normal. No colonic dilatation is noted. Postsurgical changes and diverticulosis is seen involving the sigmoid colon. Large amount of stool is seen in the rectum concerning for impaction. Small bowel dilatation is noted concerning for  distal small bowel obstruction. That may be due to a hernia in the left lower quadrant which contains several loops of small bowel. The appendix appears normal. Vascular/Lymphatic: Aortic atherosclerosis. No enlarged abdominal or pelvic lymph nodes. Reproductive: Status post prostatectomy. Other: No abnormal fluid collection is noted. Musculoskeletal: No acute or significant osseous findings. IMPRESSION: Findings consistent with distal small bowel obstruction most likely due to hernia in left lower quadrant of the abdomen. Small staghorn type calculus is noted in lower pole collecting system of left kidney. No hydronephrosis or renal obstruction is noted. Cholelithiasis without inflammation. Sigmoid diverticulosis without inflammation. Aortic Atherosclerosis (ICD10-I70.0). Electronically Signed   By: Marijo Conception M.D.   On: 10/21/2018 16:29   Dg Abd Portable 1v-small Bowel Protocol-position Verification  Result Date: 10/21/2018 CLINICAL DATA:  Evaluate NG tube EXAM: PORTABLE ABDOMEN - 1 VIEW COMPARISON:  None. FINDINGS: The NG tube terminates in the stomach. Continued small bowel obstruction. IMPRESSION: The NG tube terminates  in the stomach. Continued small bowel obstruction. Electronically Signed   By: Dorise Bullion III M.D   On: 10/21/2018 20:01        Scheduled Meds:  Chlorhexidine Gluconate Cloth  6 each Topical Daily   insulin aspart  0-9 Units Subcutaneous TID WC   pantoprazole (PROTONIX) IV  40 mg Intravenous Q12H   Continuous Infusions:  sodium chloride 100 mL/hr at 10/22/18 0300   diphenhydrAMINE Stopped (10/21/18 2209)     LOS: 1 day    Time spent: 35 minutes     Elmarie Shiley, MD [facility] Pager [phone]  If 7PM-7AM, please contact night-coverage [email or web address] Password [email or web address] 10/22/2018, 6:57 AM

## 2018-10-22 NOTE — Progress Notes (Signed)
Subjective/Chief Complaint: Patient reports multiple BM's and is passing flatus Denies abdominal pain   Objective: Vital signs in last 24 hours: Temp:  [97.7 F (36.5 C)-100.2 F (37.9 C)] 97.9 F (36.6 C) (07/04 0425) Pulse Rate:  [76-103] 80 (07/04 0500) Resp:  [18-27] 20 (07/04 0500) BP: (133-232)/(61-217) 169/63 (07/04 0500) SpO2:  [93 %-99 %] 96 % (07/04 0500) Weight:  [90.7 kg-91.9 kg] 91.9 kg (07/04 0500) Last BM Date: 10/20/18  Intake/Output from previous day: 07/03 0701 - 07/04 0700 In: 793 [I.V.:743; IV Piggyback:50] Out: 825 [Urine:425; Emesis/NG output:400] Intake/Output this shift: No intake/output data recorded.  Exam: Pt is awake and alert Appears comfortable Abdomen soft, obese, NT, I again can not feel a hernia  Lab Results:  Recent Labs    10/21/18 1417 10/22/18 0650  WBC 12.1* 8.0  HGB 16.1 13.6  HCT 47.9 42.1  PLT 247 187   BMET Recent Labs    10/21/18 1912 10/22/18 0650  NA 138 139  K 4.1 4.2  CL 100 104  CO2 29 26  GLUCOSE 372* 276*  BUN 40* 32*  CREATININE 1.56* 1.32*  CALCIUM 8.5* 8.3*   PT/INR No results for input(s): LABPROT, INR in the last 72 hours. ABG No results for input(s): PHART, HCO3 in the last 72 hours.  Invalid input(s): PCO2, PO2  Studies/Results: Ct Abdomen Pelvis Wo Contrast  Result Date: 10/21/2018 CLINICAL DATA:  Abdominal distension, emesis. EXAM: CT ABDOMEN AND PELVIS WITHOUT CONTRAST TECHNIQUE: Multidetector CT imaging of the abdomen and pelvis was performed following the standard protocol without IV contrast. COMPARISON:  CT scan of November 04, 2016. FINDINGS: Lower chest: No acute abnormality. Hepatobiliary: Cholelithiasis is noted. No biliary dilatation is noted. No focal abnormality is seen in the liver on these unenhanced images. Pancreas: Unremarkable. No pancreatic ductal dilatation or surrounding inflammatory changes. Spleen: Normal in size without focal abnormality. Adrenals/Urinary Tract: Adrenal  glands appear normal. Stable bilateral renal cysts are noted. No hydronephrosis or renal obstruction is noted. Small staghorn type calculus is noted in lower pole collecting system of left kidney. Urinary bladder is unremarkable. Stomach/Bowel: The stomach appears normal. No colonic dilatation is noted. Postsurgical changes and diverticulosis is seen involving the sigmoid colon. Large amount of stool is seen in the rectum concerning for impaction. Small bowel dilatation is noted concerning for distal small bowel obstruction. That may be due to a hernia in the left lower quadrant which contains several loops of small bowel. The appendix appears normal. Vascular/Lymphatic: Aortic atherosclerosis. No enlarged abdominal or pelvic lymph nodes. Reproductive: Status post prostatectomy. Other: No abnormal fluid collection is noted. Musculoskeletal: No acute or significant osseous findings. IMPRESSION: Findings consistent with distal small bowel obstruction most likely due to hernia in left lower quadrant of the abdomen. Small staghorn type calculus is noted in lower pole collecting system of left kidney. No hydronephrosis or renal obstruction is noted. Cholelithiasis without inflammation. Sigmoid diverticulosis without inflammation. Aortic Atherosclerosis (ICD10-I70.0). Electronically Signed   By: Marijo Conception M.D.   On: 10/21/2018 16:29   Dg Abd Portable 1v-small Bowel Obstruction Protocol-initial, 8 Hr Delay  Result Date: 10/22/2018 CLINICAL DATA:  Follow-up small bowel obstruction. 8 hour follow-up. EXAM: PORTABLE ABDOMEN - 1 VIEW COMPARISON:  10/21/2018 FINDINGS: Orogastric or nasogastric tube tip is only at the gastroesophageal junction presently. Small-bowel obstruction pattern persists with dilated fluid and air-filled small intestine. Previously administered contrast is present within the mid to distal small intestine which is dilated. Do not see any yet  reaching the colon. The patient does have some gas  within the colon, increased since previous. IMPRESSION: Orogastric or nasogastric tip only at the gastroesophageal junction. Administered contrast is present in the mid to distal small bowel which is dilated. Findings consistent with fairly high-grade partial small bowel obstruction. Electronically Signed   By: Nelson Chimes M.D.   On: 10/22/2018 06:55   Dg Abd Portable 1v-small Bowel Protocol-position Verification  Result Date: 10/21/2018 CLINICAL DATA:  Evaluate NG tube EXAM: PORTABLE ABDOMEN - 1 VIEW COMPARISON:  None. FINDINGS: The NG tube terminates in the stomach. Continued small bowel obstruction. IMPRESSION: The NG tube terminates in the stomach. Continued small bowel obstruction. Electronically Signed   By: Dorise Bullion III M.D   On: 10/21/2018 20:01    Anti-infectives: Anti-infectives (From admission, onward)   None      Assessment/Plan: SBO History of lap ventral hernia repair with mesh 04/2018  Xray shows contrast still in the small bowel with dilation.  Clinically, he looks much better. I believe that this is not a true hernia but eventration of the mesh.  SBO could just be adhesions. Will continue NPO, NG and repeat films in the morning.  LOS: 1 day    Coralie Keens 10/22/2018

## 2018-10-22 NOTE — Progress Notes (Signed)
Pharmacy Antibiotic Note  NOEMI ISHMAEL is a 73 y.o. male admitted on 10/21/2018 with N/V.  Pharmacy has been consulted for Unasyn dosing for aspiration PNA.   Plan: Unasyn 3 gm IV q6 Pharmacy to sign off  Height: 6' (182.9 cm) Weight: 202 lb 9.6 oz (91.9 kg) IBW/kg (Calculated) : 77.6  Temp (24hrs), Avg:98.3 F (36.8 C), Min:97.6 F (36.4 C), Max:100.2 F (37.9 C)  Recent Labs  Lab 10/21/18 1417 10/21/18 1912 10/22/18 0650  WBC 12.1*  --  8.0  CREATININE 1.82* 1.56* 1.32*  LATICACIDVEN  --  2.4* 1.8    Estimated Creatinine Clearance: 54.7 mL/min (A) (by C-G formula based on SCr of 1.32 mg/dL (H)).    No Known Allergies  Thank you for allowing pharmacy to be a part of this patient's care.  Eudelia Bunch, Pharm.D 10/22/2018 10:20 AM

## 2018-10-23 ENCOUNTER — Inpatient Hospital Stay (HOSPITAL_COMMUNITY): Payer: PPO

## 2018-10-23 LAB — CBC
HCT: 38.5 % — ABNORMAL LOW (ref 39.0–52.0)
Hemoglobin: 12.9 g/dL — ABNORMAL LOW (ref 13.0–17.0)
MCH: 31.9 pg (ref 26.0–34.0)
MCHC: 33.5 g/dL (ref 30.0–36.0)
MCV: 95.1 fL (ref 80.0–100.0)
Platelets: 166 10*3/uL (ref 150–400)
RBC: 4.05 MIL/uL — ABNORMAL LOW (ref 4.22–5.81)
RDW: 13.4 % (ref 11.5–15.5)
WBC: 5.4 10*3/uL (ref 4.0–10.5)
nRBC: 0 % (ref 0.0–0.2)

## 2018-10-23 LAB — GLUCOSE, CAPILLARY
Glucose-Capillary: 130 mg/dL — ABNORMAL HIGH (ref 70–99)
Glucose-Capillary: 88 mg/dL (ref 70–99)
Glucose-Capillary: 152 mg/dL — ABNORMAL HIGH (ref 70–99)
Glucose-Capillary: 158 mg/dL — ABNORMAL HIGH (ref 70–99)
Glucose-Capillary: 169 mg/dL — ABNORMAL HIGH (ref 70–99)

## 2018-10-23 LAB — BASIC METABOLIC PANEL
Anion gap: 8 (ref 5–15)
BUN: 29 mg/dL — ABNORMAL HIGH (ref 8–23)
CO2: 24 mmol/L (ref 22–32)
Calcium: 8.4 mg/dL — ABNORMAL LOW (ref 8.9–10.3)
Chloride: 110 mmol/L (ref 98–111)
Creatinine, Ser: 1.13 mg/dL (ref 0.61–1.24)
GFR calc Af Amer: >60 mL/min (ref >60)
GFR calc non Af Amer: >60 mL/min (ref >60)
Glucose, Bld: 129 mg/dL — ABNORMAL HIGH (ref 70–99)
Potassium: 4.1 mmol/L (ref 3.5–5.1)
Sodium: 142 mmol/L (ref 135–145)

## 2018-10-23 LAB — MAGNESIUM: Magnesium: 2.2 mg/dL (ref 1.7–2.4)

## 2018-10-23 MED ORDER — AMLODIPINE BESYLATE 5 MG PO TABS
5.0000 mg | ORAL_TABLET | Freq: Every day | ORAL | Status: DC
  Administered 2018-10-24 – 2018-10-25 (×2): 5 mg via ORAL
  Filled 2018-10-23 (×2): qty 1

## 2018-10-23 MED ORDER — FAMOTIDINE 20 MG PO TABS
20.0000 mg | ORAL_TABLET | Freq: Every day | ORAL | Status: DC
  Administered 2018-10-24 – 2018-10-25 (×2): 20 mg via ORAL
  Filled 2018-10-23 (×2): qty 1

## 2018-10-23 NOTE — Progress Notes (Signed)
Subjective/Chief Complaint: Pt reports more BM's Denies abdominal pain   Objective: Vital signs in last 24 hours: Temp:  [97.6 F (36.4 C)-99 F (37.2 C)] 98.3 F (36.8 C) (07/05 0456) Pulse Rate:  [66-73] 69 (07/05 0456) Resp:  [15-20] 18 (07/05 0456) BP: (131-167)/(61-80) 162/73 (07/05 0456) SpO2:  [95 %-98 %] 96 % (07/05 0456) Last BM Date: 10/20/18  Intake/Output from previous day: 07/04 0701 - 07/05 0700 In: 3067.7 [P.O.:300; I.V.:2420.8; IV Piggyback:346.8] Out: 2000 [Urine:1100; Emesis/NG output:900] Intake/Output this shift: No intake/output data recorded.  Exam: Awake and alert Comfortable Abdomen obese, still full, non-tender  Lab Results:  Recent Labs    10/21/18 1417 10/22/18 0650  WBC 12.1* 8.0  HGB 16.1 13.6  HCT 47.9 42.1  PLT 247 187   BMET Recent Labs    10/21/18 1912 10/22/18 0650  NA 138 139  K 4.1 4.2  CL 100 104  CO2 29 26  GLUCOSE 372* 276*  BUN 40* 32*  CREATININE 1.56* 1.32*  CALCIUM 8.5* 8.3*   PT/INR No results for input(s): LABPROT, INR in the last 72 hours. ABG No results for input(s): PHART, HCO3 in the last 72 hours.  Invalid input(s): PCO2, PO2  Studies/Results: Ct Abdomen Pelvis Wo Contrast  Result Date: 10/21/2018 CLINICAL DATA:  Abdominal distension, emesis. EXAM: CT ABDOMEN AND PELVIS WITHOUT CONTRAST TECHNIQUE: Multidetector CT imaging of the abdomen and pelvis was performed following the standard protocol without IV contrast. COMPARISON:  CT scan of November 04, 2016. FINDINGS: Lower chest: No acute abnormality. Hepatobiliary: Cholelithiasis is noted. No biliary dilatation is noted. No focal abnormality is seen in the liver on these unenhanced images. Pancreas: Unremarkable. No pancreatic ductal dilatation or surrounding inflammatory changes. Spleen: Normal in size without focal abnormality. Adrenals/Urinary Tract: Adrenal glands appear normal. Stable bilateral renal cysts are noted. No hydronephrosis or renal  obstruction is noted. Small staghorn type calculus is noted in lower pole collecting system of left kidney. Urinary bladder is unremarkable. Stomach/Bowel: The stomach appears normal. No colonic dilatation is noted. Postsurgical changes and diverticulosis is seen involving the sigmoid colon. Large amount of stool is seen in the rectum concerning for impaction. Small bowel dilatation is noted concerning for distal small bowel obstruction. That may be due to a hernia in the left lower quadrant which contains several loops of small bowel. The appendix appears normal. Vascular/Lymphatic: Aortic atherosclerosis. No enlarged abdominal or pelvic lymph nodes. Reproductive: Status post prostatectomy. Other: No abnormal fluid collection is noted. Musculoskeletal: No acute or significant osseous findings. IMPRESSION: Findings consistent with distal small bowel obstruction most likely due to hernia in left lower quadrant of the abdomen. Small staghorn type calculus is noted in lower pole collecting system of left kidney. No hydronephrosis or renal obstruction is noted. Cholelithiasis without inflammation. Sigmoid diverticulosis without inflammation. Aortic Atherosclerosis (ICD10-I70.0). Electronically Signed   By: Marijo Conception M.D.   On: 10/21/2018 16:29   Dg Chest Port 1 View  Result Date: 10/22/2018 CLINICAL DATA:  Fever EXAM: PORTABLE CHEST 1 VIEW COMPARISON:  09/27/2016 FINDINGS: Orogastric or nasogastric tube appears to extend only to the mid to distal esophagus. Heart size is normal. Mediastinal shadows are normal. Upper lungs are clear. There is patchy density at both lung bases consistent with atelectasis or pneumonia. No effusions. IMPRESSION: Patchy density at both lung bases consistent with atelectasis or pneumonia. Orogastric or nasogastric tube extends only to the mid to distal thoracic esophagus. Electronically Signed   By: Jan Fireman.D.  On: 10/22/2018 09:57   Dg Abd Portable 1v  Result Date:  10/23/2018 CLINICAL DATA:  Small-bowel obstruction. EXAM: PORTABLE ABDOMEN - 1 VIEW COMPARISON:  Multiple studies done yesterday. FINDINGS: Nasogastric tube not visible in the region image. Persistent partial small bowel obstruction pattern. Contrast administered yesterday has now largely passed within the colon, including all the way to the rectum. IMPRESSION: Partial small bowel obstruction. Previously administered contrast has passed into the colon, including to the rectum. Persistent dilated fluid and air-filled loops of small bowel however. Electronically Signed   By: Nelson Chimes M.D.   On: 10/23/2018 07:45   Dg Abd Portable 1v  Result Date: 10/22/2018 CLINICAL DATA:  NG tube repositioning. EXAM: PORTABLE ABDOMEN - 1 VIEW COMPARISON:  Abdominal radiograph 10/22/2018 FINDINGS: Enteric tube is coiled within the left upper quadrant, likely within the stomach. Lung bases are clear. Multiple gaseous distended loops of small bowel throughout the abdomen. IMPRESSION: NG tube coiled within the stomach. Findings compatible with small bowel obstruction. Electronically Signed   By: Lovey Newcomer M.D.   On: 10/22/2018 12:22   Dg Abd Portable 1v-small Bowel Obstruction Protocol-initial, 8 Hr Delay  Result Date: 10/22/2018 CLINICAL DATA:  Follow-up small bowel obstruction. 8 hour follow-up. EXAM: PORTABLE ABDOMEN - 1 VIEW COMPARISON:  10/21/2018 FINDINGS: Orogastric or nasogastric tube tip is only at the gastroesophageal junction presently. Small-bowel obstruction pattern persists with dilated fluid and air-filled small intestine. Previously administered contrast is present within the mid to distal small intestine which is dilated. Do not see any yet reaching the colon. The patient does have some gas within the colon, increased since previous. IMPRESSION: Orogastric or nasogastric tip only at the gastroesophageal junction. Administered contrast is present in the mid to distal small bowel which is dilated. Findings  consistent with fairly high-grade partial small bowel obstruction. Electronically Signed   By: Nelson Chimes M.D.   On: 10/22/2018 06:55   Dg Abd Portable 1v-small Bowel Protocol-position Verification  Result Date: 10/21/2018 CLINICAL DATA:  Evaluate NG tube EXAM: PORTABLE ABDOMEN - 1 VIEW COMPARISON:  None. FINDINGS: The NG tube terminates in the stomach. Continued small bowel obstruction. IMPRESSION: The NG tube terminates in the stomach. Continued small bowel obstruction. Electronically Signed   By: Dorise Bullion III M.D   On: 10/21/2018 20:01    Anti-infectives: Anti-infectives (From admission, onward)   Start     Dose/Rate Route Frequency Ordered Stop   10/22/18 1200  Ampicillin-Sulbactam (UNASYN) 3 g in sodium chloride 0.9 % 100 mL IVPB     3 g 200 mL/hr over 30 Minutes Intravenous Every 6 hours 10/22/18 1014        Assessment/Plan:  SBO  On abdominal xray this morning, contrast is now throughout the colon.  There is still fairly dilated small bowel so the obstruction is only partial.  Will clamp NG and start clear liquids  Will notify Dr. Johney Maine of the patient's admission  LOS: 2 days    Coralie Keens 10/23/2018

## 2018-10-23 NOTE — Progress Notes (Signed)
PROGRESS NOTE    Billy Horn  UGQ:[id number] DOB: Oct 11, 1945 DOA: 10/21/2018 PCP: Alroy Dust, L.Marlou Sa, MD   Brief Narrative: 73 year old with past medical history significant for prostate cancer diagnosed 2012, diabetes type 2, prior history of colonic diverticular abscess and liver abscess, ventral hernia with mesh repair in January 2020 who presents complaining of 2 days vomiting and nausea.  He reports a very small bowel movement.  Evaluation in the ED he was found to be in AKI, uncontrolled diabetes and a CT abdomen and pelvis show distal small bowel obstruction likely due to hernia in the left lower quadrant.    Assessment & Plan:   Active Problems:   Diabetes mellitus type 2, uncontrolled (HCC)   Bowel obstruction (HCC)   AKI (acute kidney injury) ([place])   DKA, type 2 (HCC)   SBO (small bowel obstruction) ([place])   1-Distal small bowel obstruction -CT abdomen Pelvis; Findings consistent with distal small bowel obstruction most likely due to hernia in left lower quadrant of the abdomen -Appreciate Dr Ninfa Linden help.  -NG tube clamp.  -Continue with IV fluids.  -IV protonix.  -received  small bowel protocol.  -had multiple loose BM, started on clears today  KUB contrast in the rectum , dilation Small bowel loops.   2-Diabetes hyperosmolar hyperglycemic state Continue with SSI. Patient NPO.  Monitor CBG.  HBA1c; 8.8. will need medications at discharge.   3-AKI; pre-renal.  Patient presented with a creatinine at 1.8, BUN 43. Last creatinine per records at 1.1 on January 2020. Continue with IV fluids.  Improved.   4-Increased anion gap acidosis: Resolved, likely related to AKI.  5-Hypertension; Continue with PRN hydralazine.   6-screening for COVID-19 negative.  7-PNA, aspiration; Leukocytosis, Fever; chest x ray with atelectasis, consolidation. Started   unasyn to cover for PNA. Day 2/5    Estimated body mass index is 27.48 kg/m as calculated from the following:  Height as of this encounter: 6' (1.829 m).   Weight as of this encounter: 91.9 kg.   DVT prophylaxis:  Start lovenox Code Status: full code Family Communication: patient relates he will update his wife. Decline I called her.  Disposition Plan: Transfer to med-surgery  Consultants:   Surgery   Procedures:   none  Antimicrobials:  Unasyn 7-04  Subjective: He is feeling better, had multiples BM, loose watery   Objective: Vitals:   10/22/18 1204 10/22/18 1219 10/22/18 2032 10/23/18 0456  BP:  (!) 144/73 (!) 157/72 (!) 162/73  Pulse:  66 71 69  Resp:  20 18 18   Temp: 97.6 F (36.4 C) 99 F (37.2 C) 98 F (36.7 C) 98.3 F (36.8 C)  TempSrc: Oral Oral Oral Oral  SpO2:  98% 96% 96%  Weight:      Height:        Intake/Output Summary (Last 24 hours) at 10/23/2018 1216 Last data filed at 10/23/2018 0955 Gross per 24 hour  Intake 2529.67 ml  Output 1900 ml  Net 629.67 ml   Filed Weights   10/21/18 1413 10/22/18 0500  Weight: 90.7 kg 91.9 kg    Examination:  General exam: NAD, BG tube in place clamp Respiratory system: CTA Cardiovascular system: S 1, S 2 RRR Gastrointestinal system: BS present, soft, nt Central nervous system: alert and oriented,  Extremities: Symmetric power.  Skin: no rashes.    Data Reviewed: I have personally reviewed following labs and imaging studies  CBC: Recent Labs  Lab 10/21/18 1417 10/22/18 0650 10/23/18 0743  WBC 12.1*  8.0 5.4  HGB 16.1 13.6 12.9*  HCT 47.9 42.1 38.5*  MCV 91.6 95.0 95.1  PLT 247 187 578   Basic Metabolic Panel: Recent Labs  Lab 10/21/18 1417 10/21/18 1912 10/22/18 0650 10/23/18 0743  NA 135 138 139 142  K 4.2 4.1 4.2 4.1  CL 94* 100 104 110  CO2 25 29 26 24   GLUCOSE 495* 372* 276* 129*  BUN 43* 40* 32* 29*  CREATININE 1.82* 1.56* 1.32* 1.13  CALCIUM 9.2 8.5* 8.3* 8.4*  MG  --   --   --  2.2   GFR: Estimated Creatinine Clearance: 63.9 mL/min (by C-G formula based on SCr of 1.13 mg/dL). Liver  Function Tests: Recent Labs  Lab 10/21/18 1417  AST 23  ALT 20  ALKPHOS 117  BILITOT 1.8*  PROT 8.2*  ALBUMIN 4.7   Recent Labs  Lab 10/21/18 1417  LIPASE 43   No results for input(s): AMMONIA in the last 168 hours. Coagulation Profile: No results for input(s): INR, PROTIME in the last 168 hours. Cardiac Enzymes: No results for input(s): CKTOTAL, CKMB, CKMBINDEX, TROPONINI in the last 168 hours. BNP (last 3 results) No results for input(s): PROBNP in the last 8760 hours. HbA1C: Recent Labs    10/21/18 1417  HGBA1C 8.8*   CBG: Recent Labs  Lab 10/22/18 1122 10/22/18 1736 10/22/18 2217 10/23/18 0717 10/23/18 1209  GLUCAP 196* 148* 118* 130* 88   Lipid Profile: No results for input(s): CHOL, HDL, LDLCALC, TRIG, CHOLHDL, LDLDIRECT in the last 72 hours. Thyroid Function Tests: No results for input(s): TSH, T4TOTAL, FREET4, T3FREE, THYROIDAB in the last 72 hours. Anemia Panel: No results for input(s): VITAMINB12, FOLATE, FERRITIN, TIBC, IRON, RETICCTPCT in the last 72 hours. Sepsis Labs: Recent Labs  Lab 10/21/18 1912 10/22/18 0650  LATICACIDVEN 2.4* 1.8    Recent Results (from the past 240 hour(s))  SARS Coronavirus 2 (CEPHEID - Performed in [facility] hospital lab), Hosp Order     Status: None   Collection Time: 10/21/18  5:58 PM   Specimen: Nasopharyngeal Swab  Result Value Ref Range Status   SARS Coronavirus 2 NEGATIVE NEGATIVE Final    Comment: (NOTE) If result is NEGATIVE SARS-CoV-2 target nucleic acids are NOT DETECTED. The SARS-CoV-2 RNA is generally detectable in upper and lower  respiratory specimens during the acute phase of infection. The lowest  concentration of SARS-CoV-2 viral copies this assay can detect is 250  copies / mL. A negative result does not preclude SARS-CoV-2 infection  and should not be used as the sole basis for treatment or other  patient management decisions.  A negative result may occur with  improper specimen collection  / handling, submission of specimen other  than nasopharyngeal swab, presence of viral mutation(s) within the  areas targeted by this assay, and inadequate number of viral copies  (<250 copies / mL). A negative result must be combined with clinical  observations, patient history, and epidemiological information. If result is POSITIVE SARS-CoV-2 target nucleic acids are DETECTED. The SARS-CoV-2 RNA is generally detectable in upper and lower  respiratory specimens dur ing the acute phase of infection.  Positive  results are indicative of active infection with SARS-CoV-2.  Clinical  correlation with patient history and other diagnostic information is  necessary to determine patient infection status.  Positive results do  not rule out bacterial infection or co-infection with other viruses. If result is PRESUMPTIVE POSTIVE SARS-CoV-2 nucleic acids MAY BE PRESENT.   A presumptive positive result was  obtained on the submitted specimen  and confirmed on repeat testing.  While 2019 novel coronavirus  (SARS-CoV-2) nucleic acids may be present in the submitted sample  additional confirmatory testing may be necessary for epidemiological  and / or clinical management purposes  to differentiate between  SARS-CoV-2 and other Sarbecovirus currently known to infect humans.  If clinically indicated additional testing with an alternate test  methodology [phone]) is advised. The SARS-CoV-2 RNA is generally  detectable in upper and lower respiratory sp ecimens during the acute  phase of infection. The expected result is Negative. Fact Sheet for Patients:  StrictlyIdeas.no Fact Sheet for Healthcare Providers: BankingDealers.co.za This test is not yet approved or cleared by the [place] FDA and has been authorized for detection and/or diagnosis of SARS-CoV-2 by FDA under an Emergency Use Authorization (EUA).  This EUA will remain in effect (meaning this  test can be used) for the duration of the COVID-19 declaration under Section 564(b)(1) of the Act, 21 U.S.C. section 360bbb-3(b)(1), unless the authorization is terminated or revoked sooner. Performed at [facility], [place] [address] [place], [place] [address]   MRSA PCR Screening     Status: None   Collection Time: 10/21/18  6:47 PM   Specimen: Nasopharyngeal  Result Value Ref Range Status   MRSA by PCR NEGATIVE NEGATIVE Final    Comment:        The GeneXpert MRSA Assay (FDA approved for NASAL specimens only), is one component of a comprehensive MRSA colonization surveillance program. It is not intended to diagnose MRSA infection nor to guide or monitor treatment for MRSA infections. Performed at [facility], [place] [address] [place], [place] [address]          Radiology Studies: Ct Abdomen Pelvis Wo Contrast  Result Date: 10/21/2018 CLINICAL DATA:  Abdominal distension, emesis. EXAM: CT ABDOMEN AND PELVIS WITHOUT CONTRAST TECHNIQUE: Multidetector CT imaging of the abdomen and pelvis was performed following the standard protocol without IV contrast. COMPARISON:  CT scan of November 04, 2016. FINDINGS: Lower chest: No acute abnormality. Hepatobiliary: Cholelithiasis is noted. No biliary dilatation is noted. No focal abnormality is seen in the liver on these unenhanced images. Pancreas: Unremarkable. No pancreatic ductal dilatation or surrounding inflammatory changes. Spleen: Normal in size without focal abnormality. Adrenals/Urinary Tract: Adrenal glands appear normal. Stable bilateral renal cysts are noted. No hydronephrosis or renal obstruction is noted. Small staghorn type calculus is noted in lower pole collecting system of left kidney. Urinary bladder is unremarkable. Stomach/Bowel: The stomach appears normal. No colonic dilatation is noted. Postsurgical changes and diverticulosis is seen involving the sigmoid colon. Large amount of stool is seen  in the rectum concerning for impaction. Small bowel dilatation is noted concerning for distal small bowel obstruction. That may be due to a hernia in the left lower quadrant which contains several loops of small bowel. The appendix appears normal. Vascular/Lymphatic: Aortic atherosclerosis. No enlarged abdominal or pelvic lymph nodes. Reproductive: Status post prostatectomy. Other: No abnormal fluid collection is noted. Musculoskeletal: No acute or significant osseous findings. IMPRESSION: Findings consistent with distal small bowel obstruction most likely due to hernia in left lower quadrant of the abdomen. Small staghorn type calculus is noted in lower pole collecting system of left kidney. No hydronephrosis or renal obstruction is noted. Cholelithiasis without inflammation. Sigmoid diverticulosis without inflammation. Aortic Atherosclerosis (ICD10-I70.0). Electronically Signed   By: Marijo Conception M.D.   On: 10/21/2018 16:29   Dg Chest [facility] 1 [address]  Result Date: 10/22/2018 CLINICAL DATA:  Fever EXAM: PORTABLE CHEST 1 VIEW COMPARISON:  09/27/2016 FINDINGS: Orogastric or nasogastric tube appears to extend only to the mid to distal esophagus. Heart size is normal. Mediastinal shadows are normal. Upper lungs are clear. There is patchy density at both lung bases consistent with atelectasis or pneumonia. No effusions. IMPRESSION: Patchy density at both lung bases consistent with atelectasis or pneumonia. Orogastric or nasogastric tube extends only to the mid to distal thoracic esophagus. Electronically Signed   By: Nelson Chimes M.D.   On: 10/22/2018 09:57   Dg Abd Portable 1v  Result Date: 10/23/2018 CLINICAL DATA:  Small-bowel obstruction. EXAM: PORTABLE ABDOMEN - 1 VIEW COMPARISON:  Multiple studies done yesterday. FINDINGS: Nasogastric tube not visible in the region image. Persistent partial small bowel obstruction pattern. Contrast administered yesterday has now largely passed within the colon, including all  the way to the rectum. IMPRESSION: Partial small bowel obstruction. Previously administered contrast has passed into the colon, including to the rectum. Persistent dilated fluid and air-filled loops of small bowel however. Electronically Signed   By: Nelson Chimes M.D.   On: 10/23/2018 07:45   Dg Abd Portable 1v  Result Date: 10/22/2018 CLINICAL DATA:  NG tube repositioning. EXAM: PORTABLE ABDOMEN - 1 VIEW COMPARISON:  Abdominal radiograph 10/22/2018 FINDINGS: Enteric tube is coiled within the left upper quadrant, likely within the stomach. Lung bases are clear. Multiple gaseous distended loops of small bowel throughout the abdomen. IMPRESSION: NG tube coiled within the stomach. Findings compatible with small bowel obstruction. Electronically Signed   By: Lovey Newcomer M.D.   On: 10/22/2018 12:22   Dg Abd Portable 1v-small Bowel Obstruction Protocol-initial, 8 Hr Delay  Result Date: 10/22/2018 CLINICAL DATA:  Follow-up small bowel obstruction. 8 hour follow-up. EXAM: PORTABLE ABDOMEN - 1 VIEW COMPARISON:  10/21/2018 FINDINGS: Orogastric or nasogastric tube tip is only at the gastroesophageal junction presently. Small-bowel obstruction pattern persists with dilated fluid and air-filled small intestine. Previously administered contrast is present within the mid to distal small intestine which is dilated. Do not see any yet reaching the colon. The patient does have some gas within the colon, increased since previous. IMPRESSION: Orogastric or nasogastric tip only at the gastroesophageal junction. Administered contrast is present in the mid to distal small bowel which is dilated. Findings consistent with fairly high-grade partial small bowel obstruction. Electronically Signed   By: Nelson Chimes M.D.   On: 10/22/2018 06:55   Dg Abd Portable 1v-small Bowel Protocol-position Verification  Result Date: 10/21/2018 CLINICAL DATA:  Evaluate NG tube EXAM: PORTABLE ABDOMEN - 1 VIEW COMPARISON:  None. FINDINGS: The NG tube  terminates in the stomach. Continued small bowel obstruction. IMPRESSION: The NG tube terminates in the stomach. Continued small bowel obstruction. Electronically Signed   By: Dorise Bullion III M.D   On: 10/21/2018 20:01        Scheduled Meds: . Chlorhexidine Gluconate Cloth  6 each Topical Daily  . enoxaparin (LOVENOX) injection  40 mg Subcutaneous Q24H  . insulin aspart  0-9 Units Subcutaneous TID WC  . pantoprazole (PROTONIX) IV  40 mg Intravenous Q12H   Continuous Infusions: . sodium chloride 50 mL/hr at 10/23/18 0910  . ampicillin-sulbactam (UNASYN) IV 3 g (10/23/18 1058)  . diphenhydrAMINE Stopped (10/22/18 2302)     LOS: 2 days    Time spent: 35 minutes     Elmarie Shiley, MD [facility] Pager [phone]  If 7PM-7AM, please contact night-coverage [email or web address] Password [email or web address] 10/23/2018, 12:16  PM  

## 2018-10-24 LAB — BASIC METABOLIC PANEL
Anion gap: 10 (ref 5–15)
BUN: 17 mg/dL (ref 8–23)
CO2: 20 mmol/L — ABNORMAL LOW (ref 22–32)
Calcium: 8.2 mg/dL — ABNORMAL LOW (ref 8.9–10.3)
Chloride: 108 mmol/L (ref 98–111)
Creatinine, Ser: 0.96 mg/dL (ref 0.61–1.24)
GFR calc Af Amer: >60 mL/min (ref >60)
GFR calc non Af Amer: >60 mL/min (ref >60)
Glucose, Bld: 151 mg/dL — ABNORMAL HIGH (ref 70–99)
Potassium: 3.5 mmol/L (ref 3.5–5.1)
Sodium: 138 mmol/L (ref 135–145)

## 2018-10-24 LAB — CBC
HCT: 36.4 % — ABNORMAL LOW (ref 39.0–52.0)
Hemoglobin: 12.1 g/dL — ABNORMAL LOW (ref 13.0–17.0)
MCH: 30.6 pg (ref 26.0–34.0)
MCHC: 33.2 g/dL (ref 30.0–36.0)
MCV: 91.9 fL (ref 80.0–100.0)
Platelets: 160 10*3/uL (ref 150–400)
RBC: 3.96 MIL/uL — ABNORMAL LOW (ref 4.22–5.81)
RDW: 12.9 % (ref 11.5–15.5)
WBC: 6.2 10*3/uL (ref 4.0–10.5)
nRBC: 0 % (ref 0.0–0.2)

## 2018-10-24 LAB — GLUCOSE, CAPILLARY
Glucose-Capillary: 131 mg/dL — ABNORMAL HIGH (ref 70–99)
Glucose-Capillary: 177 mg/dL — ABNORMAL HIGH (ref 70–99)
Glucose-Capillary: 196 mg/dL — ABNORMAL HIGH (ref 70–99)
Glucose-Capillary: 207 mg/dL — ABNORMAL HIGH (ref 70–99)

## 2018-10-24 MED ORDER — DICLOFENAC SODIUM 1 % TD GEL
2.0000 g | Freq: Four times a day (QID) | TRANSDERMAL | Status: DC
  Administered 2018-10-24 – 2018-10-25 (×2): 2 g via TOPICAL
  Filled 2018-10-24: qty 100

## 2018-10-24 MED ORDER — SODIUM CHLORIDE 0.9% FLUSH
3.0000 mL | Freq: Two times a day (BID) | INTRAVENOUS | Status: DC
  Administered 2018-10-24 (×2): 3 mL via INTRAVENOUS

## 2018-10-24 MED ORDER — SODIUM CHLORIDE 0.9 % IV SOLN: 250.0000 mL | INTRAVENOUS | Status: DC | PRN

## 2018-10-24 MED ORDER — LIVING WELL WITH DIABETES BOOK
Freq: Once | Status: AC
  Administered 2018-10-24: 18:00:00
  Filled 2018-10-24: qty 1

## 2018-10-24 MED ORDER — SODIUM CHLORIDE 0.9% FLUSH: 3.0000 mL | INTRAVENOUS | Status: DC | PRN

## 2018-10-24 MED ORDER — AMOXICILLIN-POT CLAVULANATE 875-125 MG PO TABS
1.0000 | ORAL_TABLET | Freq: Two times a day (BID) | ORAL | Status: DC
  Administered 2018-10-24 – 2018-10-25 (×2): 1 via ORAL
  Filled 2018-10-24 (×2): qty 1

## 2018-10-24 MED ORDER — LORAZEPAM 2 MG/ML IJ SOLN
0.5000 mg | Freq: Once | INTRAMUSCULAR | Status: AC
  Administered 2018-10-24: 03:00:00 0.5 mg via INTRAVENOUS
  Filled 2018-10-24: qty 1

## 2018-10-24 NOTE — Progress Notes (Addendum)
Billy Horn [id number] Dec 03, 1945  CARE TEAM:  PCP: Alroy Dust, L.Marlou Sa, MD  Outpatient Care Team: Patient Care Team: [place], Carlean Jews.Marlou Sa, MD as PCP - General (Family Medicine) Leighton Ruff, MD as Consulting Physician (Colon and Rectal Surgery) Michael Boston, MD as Consulting Physician (General Surgery) Raynelle Bring, MD as Consulting Physician (Urology)  Inpatient Treatment Team: Treatment Team: Attending Provider: Elmarie Shiley, MD; Consulting Physician: Edison Pace, Md, MD; Rounding Team: Redmond Baseman, MD; Technician: Margarita Sermons, NT; Registered Nurse: Johna Sheriff, RN; Registered Nurse: Charlyne Petrin, RN; Consulting Physician: Michael Boston, MD   Problem List:   Active Problems:   Diabetes mellitus type 2, uncontrolled ([place])   Bowel obstruction ([place])   AKI (acute kidney injury) ([place])   DKA, type 2 ([place])   SBO (small bowel obstruction) ([place])      * No surgery found *      Assessment  Ileus improving  [facility] Stay = 3 days)  Plan:  -NGT removed -Dys1 diet, ADAT solid -wean IVFs per Medicine -DM per medicine -VTE prophylaxis- SCDs, etc -mobilize as tolerated to help recovery  I see no definite evid of ventral wall hernia - I think is peritoneal fold w SB adhesions.  Dr Blima Dessert agree.  No [place] on exam of thin abdomen  D/C patient from hospital when patient meets criteria (anticipate in 1-2 day(s)):  Tolerating oral intake well Ambulating well Adequate pain control without IV medications Urinating  Having flatus Disposition planning in place   25 minutes spent in review, evaluation, examination, counseling, and coordination of care.  More than 50% of that time was spent in counseling.  10/24/2018    Subjective: (Chief complaint)  Tol liquids w NGT clamped Walking well  Objective:  Vital signs:  Vitals:   10/23/18 1943 10/23/18 2126 10/23/18 2300 10/24/18 0635  BP: (!) 172/77 (!) 176/90 (!) 160/75 (!) 149/76  Pulse: 71  68 65 69  Resp: (!) 21   18  Temp: 98.8 F (37.1 C)   99.2 F (37.3 C)  TempSrc: Oral   Oral  SpO2: 97%   97%  Weight:    94.9 kg  Height:        Last BM Date: 10/23/18  Intake/Output   Yesterday:  07/05 0701 - 07/06 0700 In: 2787.9 [P.O.:600; I.V.:1737.8; IV Piggyback:450.1] Out: 350 [Urine:350] This shift:  No intake/output data recorded.  Bowel function:  Flatus: YES  BM:  YES  Drain: Bilious NGT from yesterday   Physical Exam:  General: Pt awake/alert/oriented x4 in no acute distress Eyes: PERRL, normal EOM.  Sclera clear.  No icterus Neuro: CN II-XII intact w/o focal sensory/motor deficits. Lymph: No head/neck/groin lymphadenopathy Psych:  No delerium/psychosis/paranoia HENT: Normocephalic, Mucus membranes moist.  No thrush Neck: Supple, No tracheal deviation Chest: No chest wall pain w good excursion CV:  Pulses intact.  Regular rhythm MS: Normal AROM mjr joints.  No obvious deformity  Abdomen: Soft.  Nondistended.  Nontender.  No evidence of peritonitis.  No definite hernias.  Ext:  No deformity.  No mjr edema.  No cyanosis Skin: No petechiae / purpura  Results:   Cultures: Recent Results (from the past 720 hour(s))  SARS Coronavirus 2 (CEPHEID - Performed in [place] hospital lab), Hosp Order     Status: None   Collection Time: 10/21/18  5:58 PM   Specimen: Nasopharyngeal Swab  Result Value Ref Range Status   SARS Coronavirus 2 NEGATIVE NEGATIVE Final    Comment: (NOTE) If  result is NEGATIVE SARS-CoV-2 target nucleic acids are NOT DETECTED. The SARS-CoV-2 RNA is generally detectable in upper and lower  respiratory specimens during the acute phase of infection. The lowest  concentration of SARS-CoV-2 viral copies this assay can detect is 250  copies / mL. A negative result does not preclude SARS-CoV-2 infection  and should not be used as the sole basis for treatment or other  patient management decisions.  A negative result may occur with   improper specimen collection / handling, submission of specimen other  than nasopharyngeal swab, presence of viral mutation(s) within the  areas targeted by this assay, and inadequate number of viral copies  (<250 copies / mL). A negative result must be combined with clinical  observations, patient history, and epidemiological information. If result is POSITIVE SARS-CoV-2 target nucleic acids are DETECTED. The SARS-CoV-2 RNA is generally detectable in upper and lower  respiratory specimens dur ing the acute phase of infection.  Positive  results are indicative of active infection with SARS-CoV-2.  Clinical  correlation with patient history and other diagnostic information is  necessary to determine patient infection status.  Positive results do  not rule out bacterial infection or co-infection with other viruses. If result is PRESUMPTIVE POSTIVE SARS-CoV-2 nucleic acids MAY BE PRESENT.   A presumptive positive result was obtained on the submitted specimen  and confirmed on repeat testing.  While 2019 novel coronavirus  (SARS-CoV-2) nucleic acids may be present in the submitted sample  additional confirmatory testing may be necessary for epidemiological  and / or clinical management purposes  to differentiate between  SARS-CoV-2 and other Sarbecovirus currently known to infect humans.  If clinically indicated additional testing with an alternate test  methodology [phone]) is advised. The SARS-CoV-2 RNA is generally  detectable in upper and lower respiratory sp ecimens during the acute  phase of infection. The expected result is Negative. Fact Sheet for Patients:  StrictlyIdeas.no Fact Sheet for Healthcare Providers: BankingDealers.co.za This test is not yet approved or cleared by the [place] FDA and has been authorized for detection and/or diagnosis of SARS-CoV-2 by FDA under an Emergency Use Authorization (EUA).  This EUA will  remain in effect (meaning this test can be used) for the duration of the COVID-19 declaration under Section 564(b)(1) of the Act, 21 U.S.C. section 360bbb-3(b)(1), unless the authorization is terminated or revoked sooner. Performed at [facility], [place] [address], [place], [place] [address]   MRSA PCR Screening     Status: None   Collection Time: 10/21/18  6:47 PM   Specimen: Nasopharyngeal  Result Value Ref Range Status   MRSA by PCR NEGATIVE NEGATIVE Final    Comment:        The GeneXpert MRSA Assay (FDA approved for NASAL specimens only), is one component of a comprehensive MRSA colonization surveillance program. It is not intended to diagnose MRSA infection nor to guide or monitor treatment for MRSA infections. Performed at [facility], [place] [address] [place], [place] [address]     Labs: Results for orders placed or performed during the hospital encounter of 10/21/18 (from the past 48 hour(s))  Glucose, capillary     Status: Abnormal   Collection Time: 10/22/18  7:41 AM  Result Value Ref Range   Glucose-Capillary 257 (H) 70 - 99 mg/dL   Comment 1 Notify RN    Comment 2 Document in Chart   Glucose, capillary     Status: Abnormal   Collection Time: 10/22/18 11:22  AM  Result Value Ref Range   Glucose-Capillary 196 (H) 70 - 99 mg/dL   Comment 1 Notify RN    Comment 2 Document in Chart   Glucose, capillary     Status: Abnormal   Collection Time: 10/22/18  5:36 PM  Result Value Ref Range   Glucose-Capillary 148 (H) 70 - 99 mg/dL   Comment 1 Notify RN   Glucose, capillary     Status: Abnormal   Collection Time: 10/22/18 10:17 PM  Result Value Ref Range   Glucose-Capillary 118 (H) 70 - 99 mg/dL  Glucose, capillary     Status: Abnormal   Collection Time: 10/23/18  7:17 AM  Result Value Ref Range   Glucose-Capillary 130 (H) 70 - 99 mg/dL  CBC     Status: Abnormal   Collection Time: 10/23/18  7:43 AM  Result Value Ref Range    WBC 5.4 4.0 - 10.5 K/uL   RBC 4.05 (L) 4.22 - 5.81 MIL/uL   Hemoglobin 12.9 (L) 13.0 - 17.0 g/dL   HCT 38.5 (L) 39.0 - 52.0 %   MCV 95.1 80.0 - 100.0 fL   MCH 31.9 26.0 - 34.0 pg   MCHC 33.5 30.0 - 36.0 g/dL   RDW 13.4 11.5 - 15.5 %   Platelets 166 150 - 400 K/uL   nRBC 0.0 0.0 - 0.2 %    Comment: Performed at [facility], [place] [address] [place], [place] [address]  Basic metabolic panel     Status: Abnormal   Collection Time: 10/23/18  7:43 AM  Result Value Ref Range   Sodium 142 135 - 145 mmol/L   Potassium 4.1 3.5 - 5.1 mmol/L   Chloride 110 98 - 111 mmol/L   CO2 24 22 - 32 mmol/L   Glucose, Bld 129 (H) 70 - 99 mg/dL   BUN 29 (H) 8 - 23 mg/dL   Creatinine, Ser 1.13 0.61 - 1.24 mg/dL   Calcium 8.4 (L) 8.9 - 10.3 mg/dL   GFR calc non Af Amer >60 >60 mL/min   GFR calc Af Amer >60 >60 mL/min   Anion gap 8 5 - 15    Comment: Performed at [facility], [place] [address], [place], [place] [address]  Magnesium     Status: None   Collection Time: 10/23/18  7:43 AM  Result Value Ref Range   Magnesium 2.2 1.7 - 2.4 mg/dL    Comment: Performed at [facility], [place] [address], [place], [place] [address]  Glucose, capillary     Status: None   Collection Time: 10/23/18 12:09 PM  Result Value Ref Range   Glucose-Capillary 88 70 - 99 mg/dL  Glucose, capillary     Status: Abnormal   Collection Time: 10/23/18  4:34 PM  Result Value Ref Range   Glucose-Capillary 152 (H) 70 - 99 mg/dL  Glucose, capillary     Status: Abnormal   Collection Time: 10/23/18  6:32 PM  Result Value Ref Range   Glucose-Capillary 158 (H) 70 - 99 mg/dL  Glucose, capillary     Status: Abnormal   Collection Time: 10/23/18  9:18 PM  Result Value Ref Range   Glucose-Capillary 169 (H) 70 - 99 mg/dL  Basic metabolic panel     Status: Abnormal   Collection Time: 10/24/18  4:05 AM  Result Value Ref Range   Sodium 138 135 - 145 mmol/L   Potassium 3.5 3.5 - 5.1  mmol/L   Chloride 108 98 -  111 mmol/L   CO2 20 (L) 22 - 32 mmol/L   Glucose, Bld 151 (H) 70 - 99 mg/dL   BUN 17 8 - 23 mg/dL   Creatinine, Ser 0.96 0.61 - 1.24 mg/dL   Calcium 8.2 (L) 8.9 - 10.3 mg/dL   GFR calc non Af Amer >60 >60 mL/min   GFR calc Af Amer >60 >60 mL/min   Anion gap 10 5 - 15    Comment: Performed at [facility], [place] [address], [place], [place] [address]  CBC     Status: Abnormal   Collection Time: 10/24/18  4:05 AM  Result Value Ref Range   WBC 6.2 4.0 - 10.5 K/uL   RBC 3.96 (L) 4.22 - 5.81 MIL/uL   Hemoglobin 12.1 (L) 13.0 - 17.0 g/dL   HCT 36.4 (L) 39.0 - 52.0 %   MCV 91.9 80.0 - 100.0 fL   MCH 30.6 26.0 - 34.0 pg   MCHC 33.2 30.0 - 36.0 g/dL   RDW 12.9 11.5 - 15.5 %   Platelets 160 150 - 400 K/uL   nRBC 0.0 0.0 - 0.2 %    Comment: Performed at [facility], [place] [address], [place], [place] [address]  Glucose, capillary     Status: Abnormal   Collection Time: 10/24/18  7:12 AM  Result Value Ref Range   Glucose-Capillary 131 (H) 70 - 99 mg/dL    Imaging / Studies: Dg Chest Port 1 View  Result Date: 10/22/2018 CLINICAL DATA:  Fever EXAM: PORTABLE CHEST 1 VIEW COMPARISON:  09/27/2016 FINDINGS: Orogastric or nasogastric tube appears to extend only to the mid to distal esophagus. Heart size is normal. Mediastinal shadows are normal. Upper lungs are clear. There is patchy density at both lung bases consistent with atelectasis or pneumonia. No effusions. IMPRESSION: Patchy density at both lung bases consistent with atelectasis or pneumonia. Orogastric or nasogastric tube extends only to the mid to distal thoracic esophagus. Electronically Signed   By: Nelson Chimes M.D.   On: 10/22/2018 09:57   Dg Abd Portable 1v  Result Date: 10/23/2018 CLINICAL DATA:  Small-bowel obstruction. EXAM: PORTABLE ABDOMEN - 1 VIEW COMPARISON:  Multiple studies done yesterday. FINDINGS: Nasogastric tube not visible in the region image. Persistent  partial small bowel obstruction pattern. Contrast administered yesterday has now largely passed within the colon, including all the way to the rectum. IMPRESSION: Partial small bowel obstruction. Previously administered contrast has passed into the colon, including to the rectum. Persistent dilated fluid and air-filled loops of small bowel however. Electronically Signed   By: Nelson Chimes M.D.   On: 10/23/2018 07:45   Dg Abd Portable 1v  Result Date: 10/22/2018 CLINICAL DATA:  NG tube repositioning. EXAM: PORTABLE ABDOMEN - 1 VIEW COMPARISON:  Abdominal radiograph 10/22/2018 FINDINGS: Enteric tube is coiled within the left upper quadrant, likely within the stomach. Lung bases are clear. Multiple gaseous distended loops of small bowel throughout the abdomen. IMPRESSION: NG tube coiled within the stomach. Findings compatible with small bowel obstruction. Electronically Signed   By: Lovey Newcomer M.D.   On: 10/22/2018 12:22    Medications / Allergies: per chart  Antibiotics: Anti-infectives (From admission, onward)   Start     Dose/Rate Route Frequency Ordered Stop   10/22/18 1200  Ampicillin-Sulbactam (UNASYN) 3 g in sodium chloride 0.9 % 100 mL IVPB     3 g 200 mL/hr over 30 Minutes Intravenous Every 6 hours 10/22/18 1014  Note: Portions of this report may have been transcribed using voice recognition software. Every effort was made to ensure accuracy; however, inadvertent computerized transcription errors may be present.   Any transcriptional errors that result from this process are unintentional.     [name], MD, FACS, MASCRS Gastrointestinal and Minimally Invasive Surgery    [address], [place], [place] [address] [phone] Main / Paging [phone] Fax

## 2018-10-24 NOTE — Care Management Important Message (Signed)
Important Message  Patient Details  Name: Billy Horn MRN: [id number] Date of Birth: 1945-05-19   Medicare Important Message Given:  Yes. CMA printed out the IM for the CSW or Case Manager Nurse to give to the patient.      [name] 10/24/2018, 9:26 AM

## 2018-10-24 NOTE — Progress Notes (Signed)
PROGRESS NOTE    Billy Horn  NGE:[id number] DOB: 1946/02/06 DOA: 10/21/2018 PCP: Alroy Dust, L.Marlou Sa, MD   Brief Narrative: 73 year old with past medical history significant for prostate cancer diagnosed 2012, diabetes type 2, prior history of colonic diverticular abscess and liver abscess, ventral hernia with mesh repair in January 2020 who presents complaining of 2 days vomiting and nausea.  He reports a very small bowel movement.  Evaluation in the ED he was found to be in AKI, uncontrolled diabetes and a CT abdomen and pelvis show distal small bowel obstruction likely due to hernia in the left lower quadrant.    Assessment & Plan:   Active Problems:   Diabetes mellitus type 2, uncontrolled (HCC)   Bowel obstruction (HCC)   AKI (acute kidney injury) ([place])   DKA, type 2 (HCC)   SBO (small bowel obstruction) ([place])   1-Distal small bowel obstruction -CT abdomen Pelvis; Findings consistent with distal small bowel obstruction most likely due to hernia in left lower quadrant of the abdomen -Appreciate surgical team.  -IV protonix.  -received  small bowel protocol.  -had multiple loose BM.  KUB contrast in the rectum , dilation Small bowel loops.  NG tube removed.  Diet advance today.  If tolerates diet today plan to discharge tomorrow.   2-Diabetes hyperosmolar hyperglycemic state Continue with SSI. Patient NPO.  Monitor CBG.  HBA1c; 8.8. will need medications at discharge.   3-AKI; pre-renal.  Patient presented with a creatinine at 1.8, BUN 43. Last creatinine per records at 1.1 on January 2020. Improved with IV fluids.   4-Increased anion gap acidosis: Resolved, likely related to AKI.  5-Hypertension; Continue with PRN hydralazine.   6-screening for COVID-19 negative.  7-PNA, aspiration; Leukocytosis, Fever; chest x ray with atelectasis, consolidation. Started   unasyn to cover for PNA. Day 3/5 change to oral augmentin     Estimated body mass index is 28.37 kg/m  as calculated from the following:   Height as of this encounter: 6' (1.829 m).   Weight as of this encounter: 94.9 kg.   DVT prophylaxis:  Start lovenox Code Status: full code Family Communication: patient relates he will update his wife. Decline I called her.  Disposition Plan: home in 24 hours if tolerates soft diet.  Consultants:   Surgery   Procedures:   none  Antimicrobials:  Unasyn 7-04  Subjective: Had small BM today. Denies abdominal pain  Had clears for breakfast    Objective: Vitals:   10/23/18 2126 10/23/18 2300 10/24/18 0635 10/24/18 1257  BP: (!) 176/90 (!) 160/75 (!) 149/76 (!) 154/73  Pulse: 68 65 69 72  Resp:   18 18  Temp:   99.2 F (37.3 C) 98.7 F (37.1 C)  TempSrc:   Oral Oral  SpO2:   97% 98%  Weight:   94.9 kg   Height:        Intake/Output Summary (Last 24 hours) at 10/24/2018 1602 Last data filed at 10/24/2018 1408 Gross per 24 hour  Intake 2583.52 ml  Output 350 ml  Net 2233.52 ml   Filed Weights   10/21/18 1413 10/22/18 0500 10/24/18 0635  Weight: 90.7 kg 91.9 kg 94.9 kg    Examination:  General exam: NAD Respiratory system: CTA Cardiovascular system: S,1, S 2  RRR Gastrointestinal system; BS present, soft, nt Central nervous system: non focal.  Extremities:Symmetric power  Skin: no rashes.    Data Reviewed: I have personally reviewed following labs and imaging studies  CBC: Recent Labs  Lab 10/21/18 1417 10/22/18 0650 10/23/18 0743 10/24/18 0405  WBC 12.1* 8.0 5.4 6.2  HGB 16.1 13.6 12.9* 12.1*  HCT 47.9 42.1 38.5* 36.4*  MCV 91.6 95.0 95.1 91.9  PLT 247 187 166 737   Basic Metabolic Panel: Recent Labs  Lab 10/21/18 1417 10/21/18 1912 10/22/18 0650 10/23/18 0743 10/24/18 0405  NA 135 138 139 142 138  K 4.2 4.1 4.2 4.1 3.5  CL 94* 100 104 110 108  CO2 25 29 26 24  20*  GLUCOSE 495* 372* 276* 129* 151*  BUN 43* 40* 32* 29* 17  CREATININE 1.82* 1.56* 1.32* 1.13 0.96  CALCIUM 9.2 8.5* 8.3* 8.4* 8.2*  MG  --    --   --  2.2  --    GFR: Estimated Creatinine Clearance: 81.9 mL/min (by C-G formula based on SCr of 0.96 mg/dL). Liver Function Tests: Recent Labs  Lab 10/21/18 1417  AST 23  ALT 20  ALKPHOS 117  BILITOT 1.8*  PROT 8.2*  ALBUMIN 4.7   Recent Labs  Lab 10/21/18 1417  LIPASE 43   No results for input(s): AMMONIA in the last 168 hours. Coagulation Profile: No results for input(s): INR, PROTIME in the last 168 hours. Cardiac Enzymes: No results for input(s): CKTOTAL, CKMB, CKMBINDEX, TROPONINI in the last 168 hours. BNP (last 3 results) No results for input(s): PROBNP in the last 8760 hours. HbA1C: No results for input(s): HGBA1C in the last 72 hours. CBG: Recent Labs  Lab 10/23/18 1634 10/23/18 1832 10/23/18 2118 10/24/18 0712 10/24/18 1127  GLUCAP 152* 158* 169* 131* 177*   Lipid Profile: No results for input(s): CHOL, HDL, LDLCALC, TRIG, CHOLHDL, LDLDIRECT in the last 72 hours. Thyroid Function Tests: No results for input(s): TSH, T4TOTAL, FREET4, T3FREE, THYROIDAB in the last 72 hours. Anemia Panel: No results for input(s): VITAMINB12, FOLATE, FERRITIN, TIBC, IRON, RETICCTPCT in the last 72 hours. Sepsis Labs: Recent Labs  Lab 10/21/18 1912 10/22/18 0650  LATICACIDVEN 2.4* 1.8    Recent Results (from the past 240 hour(s))  SARS Coronavirus 2 (CEPHEID - Performed in [place] hospital lab), Hosp Order     Status: None   Collection Time: 10/21/18  5:58 PM   Specimen: Nasopharyngeal Swab  Result Value Ref Range Status   SARS Coronavirus 2 NEGATIVE NEGATIVE Final    Comment: (NOTE) If result is NEGATIVE SARS-CoV-2 target nucleic acids are NOT DETECTED. The SARS-CoV-2 RNA is generally detectable in upper and lower  respiratory specimens during the acute phase of infection. The lowest  concentration of SARS-CoV-2 viral copies this assay can detect is 250  copies / mL. A negative result does not preclude SARS-CoV-2 infection  and should not be used as  the sole basis for treatment or other  patient management decisions.  A negative result may occur with  improper specimen collection / handling, submission of specimen other  than nasopharyngeal swab, presence of viral mutation(s) within the  areas targeted by this assay, and inadequate number of viral copies  (<250 copies / mL). A negative result must be combined with clinical  observations, patient history, and epidemiological information. If result is POSITIVE SARS-CoV-2 target nucleic acids are DETECTED. The SARS-CoV-2 RNA is generally detectable in upper and lower  respiratory specimens dur ing the acute phase of infection.  Positive  results are indicative of active infection with SARS-CoV-2.  Clinical  correlation with patient history and other diagnostic information is  necessary to determine patient infection status.  Positive results do  not rule out bacterial infection or co-infection with other viruses. If result is PRESUMPTIVE POSTIVE SARS-CoV-2 nucleic acids MAY BE PRESENT.   A presumptive positive result was obtained on the submitted specimen  and confirmed on repeat testing.  While 2019 novel coronavirus  (SARS-CoV-2) nucleic acids may be present in the submitted sample  additional confirmatory testing may be necessary for epidemiological  and / or clinical management purposes  to differentiate between  SARS-CoV-2 and other Sarbecovirus currently known to infect humans.  If clinically indicated additional testing with an alternate test  methodology [phone]) is advised. The SARS-CoV-2 RNA is generally  detectable in upper and lower respiratory sp ecimens during the acute  phase of infection. The expected result is Negative. Fact Sheet for Patients:  StrictlyIdeas.no Fact Sheet for Healthcare Providers: BankingDealers.co.za This test is not yet approved or cleared by the [place] FDA and has been authorized for  detection and/or diagnosis of SARS-CoV-2 by FDA under an Emergency Use Authorization (EUA).  This EUA will remain in effect (meaning this test can be used) for the duration of the COVID-19 declaration under Section 564(b)(1) of the Act, 21 U.S.C. section 360bbb-3(b)(1), unless the authorization is terminated or revoked sooner. Performed at [facility], [place] [address], [place], [place] [address]   MRSA PCR Screening     Status: None   Collection Time: 10/21/18  6:47 PM   Specimen: Nasopharyngeal  Result Value Ref Range Status   MRSA by PCR NEGATIVE NEGATIVE Final    Comment:        The GeneXpert MRSA Assay (FDA approved for NASAL specimens only), is one component of a comprehensive MRSA colonization surveillance program. It is not intended to diagnose MRSA infection nor to guide or monitor treatment for MRSA infections. Performed at [facility], [place] [address] [place], [place] [address]          Radiology Studies: Dg Abd Portable 1v  Result Date: 10/23/2018 CLINICAL DATA:  Small-bowel obstruction. EXAM: PORTABLE ABDOMEN - 1 VIEW COMPARISON:  Multiple studies done yesterday. FINDINGS: Nasogastric tube not visible in the region image. Persistent partial small bowel obstruction pattern. Contrast administered yesterday has now largely passed within the colon, including all the way to the rectum. IMPRESSION: Partial small bowel obstruction. Previously administered contrast has passed into the colon, including to the rectum. Persistent dilated fluid and air-filled loops of small bowel however. Electronically Signed   By: Nelson Chimes M.D.   On: 10/23/2018 07:45        Scheduled Meds: . amLODipine  5 mg Oral Daily  . amoxicillin-clavulanate  1 tablet Oral BID  . Chlorhexidine Gluconate Cloth  6 each Topical Daily  . enoxaparin (LOVENOX) injection  40 mg Subcutaneous Q24H  . famotidine  20 mg Oral Daily  . insulin aspart  0-9 Units  Subcutaneous TID WC  . sodium chloride flush  3 mL Intravenous Q12H   Continuous Infusions: . sodium chloride Stopped (10/24/18 1128)  . diphenhydrAMINE 25 mg (10/23/18 2158)     LOS: 3 days    Time spent: 35 minutes     Elmarie Shiley, MD [facility] Pager [phone]  If 7PM-7AM, please contact night-coverage [email or web address] Password [email or web address] 10/24/2018, 4:02 PM

## 2018-10-25 DIAGNOSIS — R739 Hyperglycemia, unspecified: Secondary | ICD-10-CM

## 2018-10-25 LAB — GLUCOSE, CAPILLARY
Glucose-Capillary: 167 mg/dL — ABNORMAL HIGH (ref 70–99)
Glucose-Capillary: 162 mg/dL — ABNORMAL HIGH (ref 70–99)

## 2018-10-25 MED ORDER — LIVING WELL WITH DIABETES BOOK
Freq: Once | Status: AC
  Administered 2018-10-25: 11:00:00
  Filled 2018-10-25: qty 1

## 2018-10-25 MED ORDER — AMOXICILLIN-POT CLAVULANATE 875-125 MG PO TABS: 1.0000 | ORAL_TABLET | Freq: Two times a day (BID) | ORAL | 0 refills | Status: AC

## 2018-10-25 MED ORDER — METFORMIN HCL 500 MG PO TABS: 500.0000 mg | ORAL_TABLET | Freq: Two times a day (BID) | ORAL | 11 refills | Status: AC

## 2018-10-25 MED ORDER — AMLODIPINE BESYLATE 5 MG PO TABS: 5.0000 mg | ORAL_TABLET | Freq: Every day | ORAL | 0 refills | Status: AC

## 2018-10-25 NOTE — Progress Notes (Signed)
Billy Horn [id number] 1945-06-08  CARE TEAM:  PCP: Alroy Dust, L.Marlou Sa, MD  Outpatient Care Team: Patient Care Team: [place], Carlean Jews.Marlou Sa, MD as PCP - General (Family Medicine) Leighton Ruff, MD as Consulting Physician (Colon and Rectal Surgery) Michael Boston, MD as Consulting Physician (General Surgery) Raynelle Bring, MD as Consulting Physician (Urology)  Inpatient Treatment Team: Treatment Team: Attending Provider: Elmarie Shiley, MD; Consulting Physician: Edison Pace, Md, MD; Rounding Team: Redmond Baseman, MD; Technician: Margarita Sermons, NT; Registered Nurse: Johna Sheriff, RN; Consulting Physician: Michael Boston, MD; Utilization Review: Orlean Bradford, RN; Registered Nurse: Heloise Ochoa, RN   Problem List:   Active Problems:   Diabetes mellitus type 2, uncontrolled ([place])   Bowel obstruction ([place])   AKI (acute kidney injury) ([place])   DKA, type 2 ([place])   SBO (small bowel obstruction) ([place])      * No surgery found *      Assessment  Ileus resolved  [facility] Stay = 4 days)  Plan:  -adv diet to solid -off IVFs per Medicine -DM per medicine -VTE prophylaxis- SCDs, etc -mobilize as tolerated to help recovery  I see no definite evid of ventral wall hernia - I think is peritoneal fold w SB adhesions.  Dr Blima Dessert agree.  No VWH on exam of thin abdomen  D/C patient from hospital when patient meets criteria (anticipate later today):  Tolerating oral intake well Ambulating well Adequate pain control without IV medications Urinating  Having flatus Disposition planning in place   20 minutes spent in review, evaluation, examination, counseling, and coordination of care.  More than 50% of that time was spent in counseling.  10/25/2018    Subjective: (Chief complaint)  Tol Dys1 diet.  Diet not advanced  Walking well  Objective:  Vital signs:  Vitals:   10/24/18 1257 10/24/18 2127 10/25/18 0500 10/25/18 0524  BP: (!) 154/73 (!) 161/78   139/73  Pulse: 72 75  73  Resp: 18 18  17   Temp: 98.7 F (37.1 C) 98.5 F (36.9 C)  99.3 F (37.4 C)  TempSrc: Oral Oral  Oral  SpO2: 98% 98%  96%  Weight:   91.6 kg   Height:        Last BM Date: 10/24/18  Intake/Output   Yesterday:  07/06 0701 - 07/07 0700 In: 1294.9 [P.O.:1020; I.V.:174.9; IV Piggyback:100] Out: 0  This shift:  No intake/output data recorded.  Bowel function:  Flatus: YES  BM:  YES   Physical Exam:  General: Pt awake/alert/oriented x4 in no acute distress Eyes: PERRL, normal EOM.  Sclera clear.  No icterus Neuro: CN II-XII intact w/o focal sensory/motor deficits. Lymph: No head/neck/groin lymphadenopathy Psych:  No delerium/psychosis/paranoia HENT: Normocephalic, Mucus membranes moist.  No thrush Neck: Supple, No tracheal deviation Chest: No chest wall pain w good excursion CV:  Pulses intact.  Regular rhythm MS: Normal AROM mjr joints.  No obvious deformity  Abdomen: Soft.  Nondistended.  Nontender.  No evidence of peritonitis.  No definite hernias.  Ext:  No deformity.  No mjr edema.  No cyanosis Skin: No petechiae / purpura  Results:   Cultures: Recent Results (from the past 720 hour(s))  SARS Coronavirus 2 (CEPHEID - Performed in [place] hospital lab), Hosp Order     Status: None   Collection Time: 10/21/18  5:58 PM   Specimen: Nasopharyngeal Swab  Result Value Ref Range Status   SARS Coronavirus 2 NEGATIVE NEGATIVE Final    Comment: (NOTE) If  result is NEGATIVE SARS-CoV-2 target nucleic acids are NOT DETECTED. The SARS-CoV-2 RNA is generally detectable in upper and lower  respiratory specimens during the acute phase of infection. The lowest  concentration of SARS-CoV-2 viral copies this assay can detect is 250  copies / mL. A negative result does not preclude SARS-CoV-2 infection  and should not be used as the sole basis for treatment or other  patient management decisions.  A negative result may occur with  improper  specimen collection / handling, submission of specimen other  than nasopharyngeal swab, presence of viral mutation(s) within the  areas targeted by this assay, and inadequate number of viral copies  (<250 copies / mL). A negative result must be combined with clinical  observations, patient history, and epidemiological information. If result is POSITIVE SARS-CoV-2 target nucleic acids are DETECTED. The SARS-CoV-2 RNA is generally detectable in upper and lower  respiratory specimens dur ing the acute phase of infection.  Positive  results are indicative of active infection with SARS-CoV-2.  Clinical  correlation with patient history and other diagnostic information is  necessary to determine patient infection status.  Positive results do  not rule out bacterial infection or co-infection with other viruses. If result is PRESUMPTIVE POSTIVE SARS-CoV-2 nucleic acids MAY BE PRESENT.   A presumptive positive result was obtained on the submitted specimen  and confirmed on repeat testing.  While 2019 novel coronavirus  (SARS-CoV-2) nucleic acids may be present in the submitted sample  additional confirmatory testing may be necessary for epidemiological  and / or clinical management purposes  to differentiate between  SARS-CoV-2 and other Sarbecovirus currently known to infect humans.  If clinically indicated additional testing with an alternate test  methodology [phone]) is advised. The SARS-CoV-2 RNA is generally  detectable in upper and lower respiratory sp ecimens during the acute  phase of infection. The expected result is Negative. Fact Sheet for Patients:  StrictlyIdeas.no Fact Sheet for Healthcare Providers: BankingDealers.co.za This test is not yet approved or cleared by the [place] FDA and has been authorized for detection and/or diagnosis of SARS-CoV-2 by FDA under an Emergency Use Authorization (EUA).  This EUA will remain in  effect (meaning this test can be used) for the duration of the COVID-19 declaration under Section 564(b)(1) of the Act, 21 U.S.C. section 360bbb-3(b)(1), unless the authorization is terminated or revoked sooner. Performed at [facility], [place] [address] [place], [place] [address]   MRSA PCR Screening     Status: None   Collection Time: 10/21/18  6:47 PM   Specimen: Nasopharyngeal  Result Value Ref Range Status   MRSA by PCR NEGATIVE NEGATIVE Final    Comment:        The GeneXpert MRSA Assay (FDA approved for NASAL specimens only), is one component of a comprehensive MRSA colonization surveillance program. It is not intended to diagnose MRSA infection nor to guide or monitor treatment for MRSA infections. Performed at [facility], [place] [address] [place], [place] [address]     Labs: Results for orders placed or performed during the hospital encounter of 10/21/18 (from the past 48 hour(s))  Glucose, capillary     Status: None   Collection Time: 10/23/18 12:09 PM  Result Value Ref Range   Glucose-Capillary 88 70 - 99 mg/dL  Glucose, capillary     Status: Abnormal   Collection Time: 10/23/18  4:34 PM  Result Value Ref Range   Glucose-Capillary 152 (H) 70 - 99 mg/dL  Glucose, capillary     Status: Abnormal   Collection Time: 10/23/18  6:32 PM  Result Value Ref Range   Glucose-Capillary 158 (H) 70 - 99 mg/dL  Glucose, capillary     Status: Abnormal   Collection Time: 10/23/18  9:18 PM  Result Value Ref Range   Glucose-Capillary 169 (H) 70 - 99 mg/dL  Basic metabolic panel     Status: Abnormal   Collection Time: 10/24/18  4:05 AM  Result Value Ref Range   Sodium 138 135 - 145 mmol/L   Potassium 3.5 3.5 - 5.1 mmol/L   Chloride 108 98 - 111 mmol/L   CO2 20 (L) 22 - 32 mmol/L   Glucose, Bld 151 (H) 70 - 99 mg/dL   BUN 17 8 - 23 mg/dL   Creatinine, Ser 0.96 0.61 - 1.24 mg/dL   Calcium 8.2 (L) 8.9 - 10.3 mg/dL   GFR calc non Af Amer  >60 >60 mL/min   GFR calc Af Amer >60 >60 mL/min   Anion gap 10 5 - 15    Comment: Performed at [facility], [place] [address], [place], [place] [address]  CBC     Status: Abnormal   Collection Time: 10/24/18  4:05 AM  Result Value Ref Range   WBC 6.2 4.0 - 10.5 K/uL   RBC 3.96 (L) 4.22 - 5.81 MIL/uL   Hemoglobin 12.1 (L) 13.0 - 17.0 g/dL   HCT 36.4 (L) 39.0 - 52.0 %   MCV 91.9 80.0 - 100.0 fL   MCH 30.6 26.0 - 34.0 pg   MCHC 33.2 30.0 - 36.0 g/dL   RDW 12.9 11.5 - 15.5 %   Platelets 160 150 - 400 K/uL   nRBC 0.0 0.0 - 0.2 %    Comment: Performed at [facility], [place] [address] [place], [place] [address]  Glucose, capillary     Status: Abnormal   Collection Time: 10/24/18  7:12 AM  Result Value Ref Range   Glucose-Capillary 131 (H) 70 - 99 mg/dL  Glucose, capillary     Status: Abnormal   Collection Time: 10/24/18 11:27 AM  Result Value Ref Range   Glucose-Capillary 177 (H) 70 - 99 mg/dL  Glucose, capillary     Status: Abnormal   Collection Time: 10/24/18  4:14 PM  Result Value Ref Range   Glucose-Capillary 196 (H) 70 - 99 mg/dL  Glucose, capillary     Status: Abnormal   Collection Time: 10/24/18  9:31 PM  Result Value Ref Range   Glucose-Capillary 207 (H) 70 - 99 mg/dL  Glucose, capillary     Status: Abnormal   Collection Time: 10/25/18  7:29 AM  Result Value Ref Range   Glucose-Capillary 167 (H) 70 - 99 mg/dL    Imaging / Studies: No results found.  Medications / Allergies: per chart  Antibiotics: Anti-infectives (From admission, onward)   Start     Dose/Rate Route Frequency Ordered Stop   10/24/18 2200  amoxicillin-clavulanate (AUGMENTIN) 875-125 MG per tablet 1 tablet     1 tablet Oral 2 times daily 10/24/18 1602 10/27/18 2159   10/22/18 1200  Ampicillin-Sulbactam (UNASYN) 3 g in sodium chloride 0.9 % 100 mL IVPB  Status:  Discontinued     3 g 200 mL/hr over 30 Minutes Intravenous Every 6 hours 10/22/18 1014 10/24/18 1050         Note: Portions of this report may have been transcribed using voice recognition software. Every effort was made to ensure  accuracy; however, inadvertent computerized transcription errors may be present.   Any transcriptional errors that result from this process are unintentional.     [name], MD, FACS, MASCRS Gastrointestinal and Minimally Invasive Surgery    [address], [place], [place] [address] [phone] Main / Paging [phone] Fax

## 2018-10-25 NOTE — Discharge Summary (Signed)
Physician Discharge Summary  Billy Horn [id number]:[id number] DOB: August 02, 1945 DOA: 10/21/2018  PCP: Alroy Dust, L.Marlou Sa, MD  Admit date: 10/21/2018 Discharge date: 10/25/2018  Admitted From: Home  Disposition:  Home   Recommendations for Outpatient Follow-up:  1. Follow up with PCP in 1-2 weeks 2. Please obtain BMP/CBC in one week 3. Further evaluation of BP and adjustment of medications as needed.  4. Follow on CBG , and further adjust metformin as need.  5.   Follow on resolution of SBO   Discharge Condition: Stable.  CODE STATUS: full code Diet recommendation: Heart Healthy   Brief/Interim Summary: 73 year old with past medical history significant for prostate cancer diagnosed 2012, diabetes type 2, prior history of colonic diverticular abscess and liver abscess, ventral hernia with mesh repair in January 2020 who presents complaining of 2 days vomiting and nausea.  He reports a very small bowel movement.  Evaluation in the ED he was found to be in AKI, uncontrolled diabetes and a CT abdomen and pelvis show distal small bowel obstruction likely due to hernia in the left lower quadrant.  1-Distal small bowel obstruction -CT abdomen Pelvis;Findings consistent with distal small bowel obstruction most likely due to hernia in left lower quadrant of the abdomen -Appreciate surgical team.  -IV protonix.  -received  small bowel protocol.  -had multiple loose BM.  KUB contrast in the rectum , dilation Small bowel loops.  NG tube removed.  He has been tolerating diet, had BM yesterday. Passing gas.  Stable for discharge today per surgery  Follow up with Dr Johney Maine PRN  2-Diabetes hyperosmolar hyperglycemic state Continue with SSI. Patient NPO.  Monitor CBG.  HBA1c; 8.8. will need medications at discharge.  Will start low dose metformin 500 mg BID> further adjustment by PCP.   3-AKI; pre-renal.  Patient presented with a creatinine at 1.8, BUN 43. Last creatinine per records at 1.1 on January  2020. Improved with IV fluids.   4-Increased anion gap acidosis: Resolved, likely related to AKI.  5-Hypertension; Continue with PRN hydralazine.  SBP in the 170 range. Started oral norvasc with good control of BP.   6-screening for COVID-19 negative.  7-PNA, aspiration; Leukocytosis, Fever; chest x ray with atelectasis, consolidation. Started   unasyn to cover for PNA. Day 4/5 change to oral augmentin . Discharge on 1 more day of antibiotics.      Discharge Diagnoses:  Active Problems:   Diabetes mellitus type 2, uncontrolled (HCC)   Bowel obstruction (HCC)   AKI (acute kidney injury) ([place])   DKA, type 2 ([place])   SBO (small bowel obstruction) ([place])    Discharge Instructions  Discharge Instructions    Diet - low sodium heart healthy   Complete by: As directed    Increase activity slowly   Complete by: As directed      Allergies as of 10/25/2018   No Known Allergies     Medication List    STOP taking these medications   meloxicam 15 MG tablet Commonly known as: MOBIC   traMADol 50 MG tablet Commonly known as: ULTRAM     TAKE these medications   acetaminophen 325 MG tablet Commonly known as: TYLENOL Take 1-2 tablets (325-650 mg total) by mouth every 4 (four) hours as needed. What changed: reasons to take this   amLODipine 5 MG tablet Commonly known as: NORVASC Take 1 tablet (5 mg total) by mouth daily.   amoxicillin-clavulanate 875-125 MG tablet Commonly known as: AUGMENTIN Take 1 tablet by mouth 2 (two) times  daily.   aspirin EC 81 MG tablet Take 81 mg by mouth daily after breakfast.   CALCIUM-CARB 600 PO Take 600 mg by mouth daily after breakfast.   gabapentin 100 MG capsule Commonly known as: NEURONTIN Take 2 capsules (200 mg total) by mouth 3 (three) times daily as needed (for pain).   Metamucil 0.52 g capsule Generic drug: psyllium Take 1.04 g by mouth daily after breakfast.   metFORMIN 500 MG tablet Commonly known as:  Glucophage Take 1 tablet (500 mg total) by mouth 2 (two) times daily with a meal.   multivitamin with minerals Tabs tablet Take 1 tablet by mouth daily after breakfast.      Follow-up Information    Mitchell, L.Marlou Sa, MD.   Specialty: Family Medicine Contact information: [address] [place] [address] [phone]        Michael Boston, MD.   Specialty: General Surgery Why: See [place] only as needed Contact information: [place] [address] [phone]          No Known Allergies  Consultations:  General Surgery      Procedures/Studies: Ct Abdomen Pelvis Wo Contrast  Result Date: 10/21/2018 CLINICAL DATA:  Abdominal distension, emesis. EXAM: CT ABDOMEN AND PELVIS WITHOUT CONTRAST TECHNIQUE: Multidetector CT imaging of the abdomen and pelvis was performed following the standard protocol without IV contrast. COMPARISON:  CT scan of November 04, 2016. FINDINGS: Lower chest: No acute abnormality. Hepatobiliary: Cholelithiasis is noted. No biliary dilatation is noted. No focal abnormality is seen in the liver on these unenhanced images. Pancreas: Unremarkable. No pancreatic ductal dilatation or surrounding inflammatory changes. Spleen: Normal in size without focal abnormality. Adrenals/Urinary Tract: Adrenal glands appear normal. Stable bilateral renal cysts are noted. No hydronephrosis or renal obstruction is noted. Small staghorn type calculus is noted in lower pole collecting system of left kidney. Urinary bladder is unremarkable. Stomach/Bowel: The stomach appears normal. No colonic dilatation is noted. Postsurgical changes and diverticulosis is seen involving the sigmoid colon. Large amount of stool is seen in the rectum concerning for impaction. Small bowel dilatation is noted concerning for distal small bowel obstruction. That may be due to a hernia in the left lower quadrant which contains several loops of small bowel. The appendix  appears normal. Vascular/Lymphatic: Aortic atherosclerosis. No enlarged abdominal or pelvic lymph nodes. Reproductive: Status post prostatectomy. Other: No abnormal fluid collection is noted. Musculoskeletal: No acute or significant osseous findings. IMPRESSION: Findings consistent with distal small bowel obstruction most likely due to hernia in left lower quadrant of the abdomen. Small staghorn type calculus is noted in lower pole collecting system of left kidney. No hydronephrosis or renal obstruction is noted. Cholelithiasis without inflammation. Sigmoid diverticulosis without inflammation. Aortic Atherosclerosis (ICD10-I70.0). Electronically Signed   By: Marijo Conception M.D.   On: 10/21/2018 16:29   Dg Chest Port 1 View  Result Date: 10/22/2018 CLINICAL DATA:  Fever EXAM: PORTABLE CHEST 1 VIEW COMPARISON:  09/27/2016 FINDINGS: Orogastric or nasogastric tube appears to extend only to the mid to distal esophagus. Heart size is normal. Mediastinal shadows are normal. Upper lungs are clear. There is patchy density at both lung bases consistent with atelectasis or pneumonia. No effusions. IMPRESSION: Patchy density at both lung bases consistent with atelectasis or pneumonia. Orogastric or nasogastric tube extends only to the mid to distal thoracic esophagus. Electronically Signed   By: Nelson Chimes M.D.   On: 10/22/2018 09:57   Dg Abd Portable 1v  Result Date: 10/23/2018 CLINICAL DATA:  Small-bowel obstruction. EXAM: PORTABLE ABDOMEN - 1 VIEW COMPARISON:  Multiple studies done yesterday. FINDINGS: Nasogastric tube not visible in the region image. Persistent partial small bowel obstruction pattern. Contrast administered yesterday has now largely passed within the colon, including all the way to the rectum. IMPRESSION: Partial small bowel obstruction. Previously administered contrast has passed into the colon, including to the rectum. Persistent dilated fluid and air-filled loops of small bowel however.  Electronically Signed   By: Nelson Chimes M.D.   On: 10/23/2018 07:45   Dg Abd Portable 1v  Result Date: 10/22/2018 CLINICAL DATA:  NG tube repositioning. EXAM: PORTABLE ABDOMEN - 1 VIEW COMPARISON:  Abdominal radiograph 10/22/2018 FINDINGS: Enteric tube is coiled within the left upper quadrant, likely within the stomach. Lung bases are clear. Multiple gaseous distended loops of small bowel throughout the abdomen. IMPRESSION: NG tube coiled within the stomach. Findings compatible with small bowel obstruction. Electronically Signed   By: Lovey Newcomer M.D.   On: 10/22/2018 12:22   Dg Abd Portable 1v-small Bowel Obstruction Protocol-initial, 8 Hr Delay  Result Date: 10/22/2018 CLINICAL DATA:  Follow-up small bowel obstruction. 8 hour follow-up. EXAM: PORTABLE ABDOMEN - 1 VIEW COMPARISON:  10/21/2018 FINDINGS: Orogastric or nasogastric tube tip is only at the gastroesophageal junction presently. Small-bowel obstruction pattern persists with dilated fluid and air-filled small intestine. Previously administered contrast is present within the mid to distal small intestine which is dilated. Do not see any yet reaching the colon. The patient does have some gas within the colon, increased since previous. IMPRESSION: Orogastric or nasogastric tip only at the gastroesophageal junction. Administered contrast is present in the mid to distal small bowel which is dilated. Findings consistent with fairly high-grade partial small bowel obstruction. Electronically Signed   By: Nelson Chimes M.D.   On: 10/22/2018 06:55   Dg Abd Portable 1v-small Bowel Protocol-position Verification  Result Date: 10/21/2018 CLINICAL DATA:  Evaluate NG tube EXAM: PORTABLE ABDOMEN - 1 VIEW COMPARISON:  None. FINDINGS: The NG tube terminates in the stomach. Continued small bowel obstruction. IMPRESSION: The NG tube terminates in the stomach. Continued small bowel obstruction. Electronically Signed   By: Dorise Bullion III M.D   On: 10/21/2018 20:01      Subjective: He is feeling better. Had BM yesterday. Passing gas. Tolerating diet.   Discharge Exam: Vitals:   10/25/18 0524 10/25/18 1326  BP: 139/73 135/75  Pulse: 73 71  Resp: 17 18  Temp: 99.3 F (37.4 C) 98.8 F (37.1 C)  SpO2: 96% 95%     General: Pt is alert, awake, not in acute distress Cardiovascular: RRR, S1/S2 +, no rubs, no gallops Respiratory: CTA bilaterally, no wheezing, no rhonchi Abdominal: Soft, NT, ND, bowel sounds + Extremities: no edema, no cyanosis    The results of significant diagnostics from this hospitalization (including imaging, microbiology, ancillary and laboratory) are listed below for reference.     Microbiology: Recent Results (from the past 240 hour(s))  SARS Coronavirus 2 (CEPHEID - Performed in [place] hospital lab), Hosp Order     Status: None   Collection Time: 10/21/18  5:58 PM   Specimen: Nasopharyngeal Swab  Result Value Ref Range Status   SARS Coronavirus 2 NEGATIVE NEGATIVE Final    Comment: (NOTE) If result is NEGATIVE SARS-CoV-2 target nucleic acids are NOT DETECTED. The SARS-CoV-2 RNA is generally detectable in upper and lower  respiratory specimens during the acute phase of infection. The lowest  concentration of SARS-CoV-2  viral copies this assay can detect is 250  copies / mL. A negative result does not preclude SARS-CoV-2 infection  and should not be used as the sole basis for treatment or other  patient management decisions.  A negative result may occur with  improper specimen collection / handling, submission of specimen other  than nasopharyngeal swab, presence of viral mutation(s) within the  areas targeted by this assay, and inadequate number of viral copies  (<250 copies / mL). A negative result must be combined with clinical  observations, patient history, and epidemiological information. If result is POSITIVE SARS-CoV-2 target nucleic acids are DETECTED. The SARS-CoV-2 RNA is generally detectable  in upper and lower  respiratory specimens dur ing the acute phase of infection.  Positive  results are indicative of active infection with SARS-CoV-2.  Clinical  correlation with patient history and other diagnostic information is  necessary to determine patient infection status.  Positive results do  not rule out bacterial infection or co-infection with other viruses. If result is PRESUMPTIVE POSTIVE SARS-CoV-2 nucleic acids MAY BE PRESENT.   A presumptive positive result was obtained on the submitted specimen  and confirmed on repeat testing.  While 2019 novel coronavirus  (SARS-CoV-2) nucleic acids may be present in the submitted sample  additional confirmatory testing may be necessary for epidemiological  and / or clinical management purposes  to differentiate between  SARS-CoV-2 and other Sarbecovirus currently known to infect humans.  If clinically indicated additional testing with an alternate test  methodology [phone]) is advised. The SARS-CoV-2 RNA is generally  detectable in upper and lower respiratory sp ecimens during the acute  phase of infection. The expected result is Negative. Fact Sheet for Patients:  StrictlyIdeas.no Fact Sheet for Healthcare Providers: BankingDealers.co.za This test is not yet approved or cleared by the [place] FDA and has been authorized for detection and/or diagnosis of SARS-CoV-2 by FDA under an Emergency Use Authorization (EUA).  This EUA will remain in effect (meaning this test can be used) for the duration of the COVID-19 declaration under Section 564(b)(1) of the Act, 21 U.S.C. section 360bbb-3(b)(1), unless the authorization is terminated or revoked sooner. Performed at [facility], [place] [address] [place], [place] [address]   MRSA PCR Screening     Status: None   Collection Time: 10/21/18  6:47 PM   Specimen: Nasopharyngeal  Result Value Ref Range Status   MRSA  by PCR NEGATIVE NEGATIVE Final    Comment:        The GeneXpert MRSA Assay (FDA approved for NASAL specimens only), is one component of a comprehensive MRSA colonization surveillance program. It is not intended to diagnose MRSA infection nor to guide or monitor treatment for MRSA infections. Performed at [facility], [place] [address] [place], [place] [address]      Labs: BNP (last 3 results) No results for input(s): BNP in the last 8760 hours. Basic Metabolic Panel: Recent Labs  Lab 10/21/18 1417 10/21/18 1912 10/22/18 0650 10/23/18 0743 10/24/18 0405  NA 135 138 139 142 138  K 4.2 4.1 4.2 4.1 3.5  CL 94* 100 104 110 108  CO2 25 29 26 24  20*  GLUCOSE 495* 372* 276* 129* 151*  BUN 43* 40* 32* 29* 17  CREATININE 1.82* 1.56* 1.32* 1.13 0.96  CALCIUM 9.2 8.5* 8.3* 8.4* 8.2*  MG  --   --   --  2.2  --    Liver Function Tests: Recent Labs  Lab 10/21/18  1417  AST 23  ALT 20  ALKPHOS 117  BILITOT 1.8*  PROT 8.2*  ALBUMIN 4.7   Recent Labs  Lab 10/21/18 1417  LIPASE 43   No results for input(s): AMMONIA in the last 168 hours. CBC: Recent Labs  Lab 10/21/18 1417 10/22/18 0650 10/23/18 0743 10/24/18 0405  WBC 12.1* 8.0 5.4 6.2  HGB 16.1 13.6 12.9* 12.1*  HCT 47.9 42.1 38.5* 36.4*  MCV 91.6 95.0 95.1 91.9  PLT 247 187 166 160   Cardiac Enzymes: No results for input(s): CKTOTAL, CKMB, CKMBINDEX, TROPONINI in the last 168 hours. BNP: Invalid input(s): POCBNP CBG: Recent Labs  Lab 10/24/18 1127 10/24/18 1614 10/24/18 2131 10/25/18 0729 10/25/18 1128  GLUCAP 177* 196* 207* 167* 162*   D-Dimer No results for input(s): DDIMER in the last 72 hours. Hgb A1c No results for input(s): HGBA1C in the last 72 hours. Lipid Profile No results for input(s): CHOL, HDL, LDLCALC, TRIG, CHOLHDL, LDLDIRECT in the last 72 hours. Thyroid function studies No results for input(s): TSH, T4TOTAL, T3FREE, THYROIDAB in the last 72 hours.  Invalid  input(s): FREET3 Anemia work up No results for input(s): VITAMINB12, FOLATE, FERRITIN, TIBC, IRON, RETICCTPCT in the last 72 hours. Urinalysis    Component Value Date/Time   COLORURINE YELLOW 10/21/2018 1417   APPEARANCEUR CLEAR 10/21/2018 1417   LABSPEC 1.037 (H) 10/21/2018 1417   PHURINE 5.0 10/21/2018 1417   GLUCOSEU >=500 (A) 10/21/2018 1417   HGBUR LARGE (A) 10/21/2018 1417   BILIRUBINUR NEGATIVE 10/21/2018 1417   KETONESUR 5 (A) 10/21/2018 1417   PROTEINUR 30 (A) 10/21/2018 1417   NITRITE NEGATIVE 10/21/2018 1417   LEUKOCYTESUR NEGATIVE 10/21/2018 1417   Sepsis Labs Invalid input(s): PROCALCITONIN,  WBC,  LACTICIDVEN Microbiology Recent Results (from the past 240 hour(s))  SARS Coronavirus 2 (CEPHEID - Performed in [place] hospital lab), Hosp Order     Status: None   Collection Time: 10/21/18  5:58 PM   Specimen: Nasopharyngeal Swab  Result Value Ref Range Status   SARS Coronavirus 2 NEGATIVE NEGATIVE Final    Comment: (NOTE) If result is NEGATIVE SARS-CoV-2 target nucleic acids are NOT DETECTED. The SARS-CoV-2 RNA is generally detectable in upper and lower  respiratory specimens during the acute phase of infection. The lowest  concentration of SARS-CoV-2 viral copies this assay can detect is 250  copies / mL. A negative result does not preclude SARS-CoV-2 infection  and should not be used as the sole basis for treatment or other  patient management decisions.  A negative result may occur with  improper specimen collection / handling, submission of specimen other  than nasopharyngeal swab, presence of viral mutation(s) within the  areas targeted by this assay, and inadequate number of viral copies  (<250 copies / mL). A negative result must be combined with clinical  observations, patient history, and epidemiological information. If result is POSITIVE SARS-CoV-2 target nucleic acids are DETECTED. The SARS-CoV-2 RNA is generally detectable in upper and lower   respiratory specimens dur ing the acute phase of infection.  Positive  results are indicative of active infection with SARS-CoV-2.  Clinical  correlation with patient history and other diagnostic information is  necessary to determine patient infection status.  Positive results do  not rule out bacterial infection or co-infection with other viruses. If result is PRESUMPTIVE POSTIVE SARS-CoV-2 nucleic acids MAY BE PRESENT.   A presumptive positive result was obtained on the submitted specimen  and confirmed on repeat testing.  While 2019 novel  coronavirus  (SARS-CoV-2) nucleic acids may be present in the submitted sample  additional confirmatory testing may be necessary for epidemiological  and / or clinical management purposes  to differentiate between  SARS-CoV-2 and other Sarbecovirus currently known to infect humans.  If clinically indicated additional testing with an alternate test  methodology [phone]) is advised. The SARS-CoV-2 RNA is generally  detectable in upper and lower respiratory sp ecimens during the acute  phase of infection. The expected result is Negative. Fact Sheet for Patients:  StrictlyIdeas.no Fact Sheet for Healthcare Providers: BankingDealers.co.za This test is not yet approved or cleared by the [place] FDA and has been authorized for detection and/or diagnosis of SARS-CoV-2 by FDA under an Emergency Use Authorization (EUA).  This EUA will remain in effect (meaning this test can be used) for the duration of the COVID-19 declaration under Section 564(b)(1) of the Act, 21 U.S.C. section 360bbb-3(b)(1), unless the authorization is terminated or revoked sooner. Performed at [facility], [place] [address], [place], [place] [address]   MRSA PCR Screening     Status: None   Collection Time: 10/21/18  6:47 PM   Specimen: Nasopharyngeal  Result Value Ref Range Status   MRSA by PCR NEGATIVE  NEGATIVE Final    Comment:        The GeneXpert MRSA Assay (FDA approved for NASAL specimens only), is one component of a comprehensive MRSA colonization surveillance program. It is not intended to diagnose MRSA infection nor to guide or monitor treatment for MRSA infections. Performed at [facility], [place] [address], [place], [place] [address]      Time coordinating discharge: 40 minutes  SIGNED:   Elmarie Shiley, MD  [facility]

## 2018-10-25 NOTE — Progress Notes (Signed)
Inpatient Diabetes Program Recommendations  AACE/ADA: New Consensus Statement on Inpatient Glycemic Control (2015)  Target Ranges:  Prepandial:   less than 140 mg/dL      Peak postprandial:   less than 180 mg/dL (1-2 hours)      Critically ill patients:  140 - 180 mg/dL   Lab Results  Component Value Date   GLUCAP 162 (H) 10/25/2018   HGBA1C 8.8 (H) 10/21/2018    Review of Glycemic Control  Diabetes history: DM2 Outpatient Diabetes medications: None Current orders for Inpatient glycemic control: Novolog 0-9 units tidwc  HgbA1C 8.8% - up from 6.8%  Spoke with pt about his HgbA1C and importance of getting back down to < 7%. Pt states he hasn't been as active as he usually is. Tries to eat healthy and has meter to check blood sugars at home.   Inpatient Diabetes Program Recommendations:     To be discharged on: metformin 500 mg bid Will f/u with PCP for diabetes management.  Thank you. Lorenda Peck, RD, LDN, CDE Inpatient Diabetes Coordinator [phone]

## 2018-10-25 NOTE — Discharge Instructions (Signed)
EATING AFTER A SMALL BOWEL OBSTRUCTION   EAT Gradually transition to a high fiber diet with a fiber supplement over the next few days after discharge  WALK Walk an hour a day.  Control your pain to do that.    CONTROL PAIN Control pain so that you can walk, sleep, tolerate sneezing/coughing, go up/down stairs.  HAVE A BOWEL MOVEMENT DAILY Keep your bowels regular to avoid problems.  OK to try a laxative to override constipation.  OK to use an antidairrheal to slow down diarrhea.  Call if not better after 2 tries  CALL IF YOU HAVE PROBLEMS/CONCERNS Call if you are still struggling despite following these instructions. Call if you have concerns not answered by these instructions     After your BOWEL OBSTRUCTION, expect some issues over the next few weeks.    To help you through this temporary phase, we start you out on a pureed (blenderized) diet.  Your first meal in the hospital was thin liquids.  You should have been given a pureed diet by the time you left the hospital.  We ask patients to stay on a pureed diet for the first few days to avoid anything getting "stuck."  Don't be alarmed if your ability to swallow doesn't progress according to this plan.  Everyone is different and some diets can advance more or less quickly.     Some BASIC RULES to follow are:  Maintain an upright position whenever eating or drinking.  Take small bites - just a teaspoon size bite at a time.  Eat slowly.  It may also help to eat only one food at a time.  Consider nibbling through smaller, more frequent meals & avoid the urge to eat BIG meals  Do not push through feelings of fullness, nausea, or bloatedness  Do not mix solid foods and liquids in the same mouthful  Try not to "wash foods down" with large gulps of liquids.  Avoid carbonated (bubbly/fizzy) drinks.    Avoid foods that make you feel gassy or bloated.  Start with bland foods first.  Wait on trying greasy, fried, or spicy meals  until you are tolerating more bland solids well.  Expect to be more gassy/flatulent/bloated initially.  Walking will help your body manage it better.  Consider using medications for bloating that contain simethicone such as  Maalox or Gas-X   Eat in a relaxed atmosphere & minimize distractions.  Avoid talking while eating.    Do not use straws.  Following each meal, sit in an upright position (90 degree angle) for 60 to 90 minutes.  Going for a short walk can help as well  If food does stick, don't panic.  Try to relax and let the food pass on its own.  Sipping WARM LIQUID such as strong hot black tea can also help slide it down.   Be gradual in changes & use common sense:  -If you easily tolerating a certain "level" of foods, advance to the next level gradually -If you are having trouble swallowing a particular food, then avoid it.   -If food is sticking when you advance your diet, go back to thinner previous diet (the lower LEVEL) for 1-2 days.  LEVEL 1 = PUREED DIET  Do for the first [place] in this group are pureed or blenderized to a smooth, mashed potato-like consistency.  -If necessary, the pureed foods can keep their shape with the addition of a thickening agent.   -  Meat should be pureed to a smooth, pasty consistency.  Hot broth or gravy may be added to the pureed meat, approximately 1 oz. of liquid per 3 oz. serving of meat. -CAUTION:  If any foods do not puree into a smooth consistency, swallowing will be more difficult.  (For example, nuts or seeds sometimes do not blend well.)  Hot Foods Cold Foods  Pureed scrambled eggs and cheese Pureed cottage cheese  Baby cereals Thickened juices and nectars  Thinned cooked cereals (no lumps) Thickened milk or eggnog  Pureed [place] toast or pancakes Ensure  Mashed potatoes Ice cream  Pureed parsley, au gratin, scalloped potatoes, candied sweet potatoes Fruit or [place] ice, sherbet  Pureed  buttered or alfredo noodles Plain yogurt  Pureed vegetables (no corn or peas) Instant breakfast  Pureed soups and creamed soups Smooth pudding, mousse, custard  Pureed scalloped apples Whipped gelatin  Gravies Sugar, syrup, honey, jelly  Sauces, cheese, tomato, barbecue, white, creamed Cream  Any baby food Creamer  Alcohol in moderation (not beer or champagne) Margarine  Coffee or tea Mayonnaise   Ketchup, mustard   Apple sauce   SAMPLE MENU:  PUREED DIET Breakfast Lunch Dinner   Orange juice, 1/2 cup  Cream of wheat, 1/2 cup  Pineapple juice, 1/2 cup  Pureed [place], barley soup, 3/4 cup  Pureed Hawaiian chicken, 3 oz   Scrambled eggs, mashed or blended with cheese, 1/2 cup  Tea or coffee, 1 cup   Whole milk, 1 cup   Non-dairy creamer, 2 Tbsp.  Mashed potatoes, 1/2 cup  Pureed cooled broccoli, 1/2 cup  Apple sauce, 1/2 cup  Coffee or tea  Mashed potatoes, 1/2 cup  Pureed spinach, 1/2 cup  Frozen yogurt, 1/2 cup  Tea or coffee      LEVEL 2 = SOFT DIET  After your first few days, you can advance to a soft diet.   Keep on this diet until everything goes down easily.  Hot Foods Cold Foods  White fish Cottage cheese  Stuffed fish Junior baby fruit  Baby food meals Semi thickened juices  Minced soft cooked, scrambled, poached eggs nectars  Souffle & omelets Ripe mashed bananas  Cooked cereals Canned fruit, pineapple sauce, milk  potatoes Milkshake  Buttered or Alfredo noodles Custard  Cooked cooled vegetable Puddings, including tapioca  Sherbet Yogurt  Vegetable soup or alphabet soup Fruit ice, [place] ice  Gravies Whipped gelatin  Sugar, syrup, honey, jelly Junior baby desserts  Sauces:  Cheese, creamed, barbecue, tomato, white Cream  Coffee or tea Margarine   SAMPLE MENU:  LEVEL 2 Breakfast Lunch Dinner   Orange juice, 1/2 cup  Oatmeal, 1/2 cup  Scrambled eggs with cheese, 1/2 cup  Decaffeinated tea, 1 cup  Whole milk, 1 cup  Non-dairy  creamer, 2 Tbsp  Pineapple juice, 1/2 cup  Minced beef, 3 oz  Gravy, 2 Tbsp  Mashed potatoes, 1/2 cup  Minced fresh broccoli, 1/2 cup  Applesauce, 1/2 cup  Coffee, 1 cup  [place], barley soup, 3/4 cup  Minced Hawaiian chicken, 3 oz  Mashed potatoes, 1/2 cup  Cooked spinach, 1/2 cup  Frozen yogurt, 1/2 cup  Non-dairy creamer, 2 Tbsp      LEVEL 3 = CHOPPED DIET  -After all the foods in level 2 (soft diet) are passing through well you should advance up to more chopped foods.  -It is still important to cut these foods into small pieces and eat slowly.  [employer] cheese  Chopped Swedish meatballs Yogurt  Meat salads (ground or flaked meat) Milk  Flaked fish (tuna) Milkshakes  Poached or scrambled eggs Soft, cold, dry cereal  Souffles and omelets Fruit juices or nectars  Cooked cereals Chopped canned fruit  Chopped [place] toast or pancakes Canned fruit cocktail  Noodles or pasta (no rice) Pudding, mousse, custard  Cooked vegetables (no frozen peas, corn, or mixed vegetables) Green salad  Canned small sweet peas Ice cream  Creamed soup or vegetable soup Fruit ice, [place] ice  Pureed vegetable soup or alphabet soup Non-dairy creamer  Ground scalloped apples Margarine  Gravies Mayonnaise  Sauces:  Cheese, creamed, barbecue, tomato, white Ketchup  Coffee or tea Mustard   SAMPLE MENU:  LEVEL 3 Breakfast Lunch Dinner   Orange juice, 1/2 cup  Oatmeal, 1/2 cup  Scrambled eggs with cheese, 1/2 cup  Decaffeinated tea, 1 cup  Whole milk, 1 cup  Non-dairy creamer, 2 Tbsp  Ketchup, 1 Tbsp  Margarine, 1 tsp  Salt, 1/4 tsp  Sugar, 2 tsp  Pineapple juice, 1/2 cup  Ground beef, 3 oz  Gravy, 2 Tbsp  Mashed potatoes, 1/2 cup  Cooked spinach, 1/2 cup  Applesauce, 1/2 cup  Decaffeinated coffee  Whole milk  Non-dairy creamer, 2 Tbsp  Margarine, 1 tsp  Salt, 1/4 tsp  Pureed [place], barley soup, 3/4 cup  Barbecue chicken,  3 oz  Mashed potatoes, 1/2 cup  Ground fresh broccoli, 1/2 cup  Frozen yogurt, 1/2 cup  Decaffeinated tea, 1 cup  Non-dairy creamer, 2 Tbsp  Margarine, 1 tsp  Salt, 1/4 tsp  Sugar, 1 tsp    LEVEL 4:  REGULAR FOODS  -Foods in this group are soft, moist, regularly textured foods.   -This level includes meat and breads, which tend to be the hardest things to swallow.   -Eat very slowly, chew well and continue to avoid carbonated drinks. -most people are at this level in 2-4 weeks  Hot Foods Cold Foods  Baked fish or skinned Soft cheeses - cottage cheese  Souffles and omelets Cream cheese  Eggs Yogurt  Stuffed shells Milk  Spaghetti with meat sauce Milkshakes  Cooked cereal Cold dry cereals (no nuts, dried fruit, coconut)  [place] toast or pancakes Crackers  Buttered toast Fruit juices or nectars  Noodles or pasta (no rice) Canned fruit  Potatoes (all types) Ripe bananas  Soft, cooked vegetables (no corn, lima, or baked beans) Peeled, ripe, fresh fruit  Creamed soups or vegetable soup Cakes (no nuts, dried fruit, coconut)  Canned chicken noodle soup Plain doughnuts  Gravies Ice cream  Bacon dressing Pudding, mousse, custard  Sauces:  Cheese, creamed, barbecue, tomato, white Fruit ice, [place] ice, sherbet  Decaffeinated tea or coffee Whipped gelatin  Pork chops Regular gelatin   Canned fruited gelatin molds   Sugar, syrup, honey, jam, jelly   Cream   Non-dairy   Margarine   Oil   Mayonnaise   Ketchup   Mustard   TROUBLESHOOTING IRREGULAR BOWELS  1) Avoid extremes of bowel movements (no bad constipation/diarrhea)  2) Miralax 17gm mixed in 8oz. water or juice-daily. May use BID as needed.  3) Gas-x,Phazyme, etc. as needed for gas & bloating.  4) Soft,bland diet. No spicy,greasy,fried foods.  5) Prilosec over-the-counter as needed  6) May hold gluten/wheat products from diet to see if symptoms improve.  7) May try probiotics (Align, Activa, etc) to help calm the  bowels down  7) If symptoms become worse call back immediately.    If  you have any questions please call our office at [place]: [phone].   Small Bowel Obstruction A small bowel obstruction is a blockage in the small bowel. The small bowel, which is also called the small intestine, is a long, slender tube that connects the stomach to the colon. When a person eats and drinks, food and fluids go from the stomach to the small bowel. This is where most of the nutrients in the food and fluids are absorbed. A small bowel obstruction will prevent food and fluids from passing through the small bowel as they normally do during digestion. The small bowel can become partially or completely blocked. This can cause symptoms such as abdominal pain, vomiting, and bloating. If this condition is not treated, it can be dangerous because the small bowel could rupture. CAUSES Common causes of this condition include: 2. Scar tissue from previous surgery or radiation treatment. 3. Recent surgery. This may cause the movements of the bowel to slow down and cause food to block the intestine. 4. Hernias. 5. Inflammatory bowel disease (colitis). 6. Twisting of the bowel (volvulus). 7. Tumors. 8. A foreign body. 9. Slipping of a part of the bowel into another part (intussusception). SYMPTOMS Symptoms of this condition include: 2. Abdominal pain. This may be dull cramps or sharp pain. It may occur in one area, or it may be present in the entire abdomen. Pain can range from mild to severe, depending on the degree of obstruction. 3. Nausea and vomiting. Vomit may be greenish or a yellow bile color. 4. Abdominal bloating. 5. Constipation. 6. Lack of passing gas. 7. Frequent belching. 8. Diarrhea. This may occur if the obstruction is partial and runny stool is able to leak around the obstruction. DIAGNOSIS This condition may be diagnosed based on a physical exam, medical history, and X-rays of the  abdomen. You may also have other tests, such as a CT scan of the abdomen and pelvis. TREATMENT Treatment for this condition depends on the cause and severity of the problem. Treatment options may include: 2. Bed rest along with fluids and pain medicines that are given through an IV tube inserted into one of your veins. Sometimes, this is all that is needed for the obstruction to improve. 3. Following a simple diet. In some cases, a clear liquid diet may be required for several days. This allows the bowel to rest. 4. Placement of a small tube (nasogastric tube) into the stomach. When the bowel is blocked, it usually swells up like a balloon that is filled with air and fluids. The air and fluids may be removed by suction through the nasogastric tube. This can help with pain, discomfort, and nausea. It can also help the obstruction to clear up faster. 5. Surgery. This may be required if other treatments do not work. Bowel obstruction from a hernia may require early surgery and can be an emergency procedure. Surgery may also be required for scar tissue that causes frequent or severe obstructions. HOME CARE INSTRUCTIONS  Get plenty of rest.  Follow instructions from your health care provider about eating restrictions. You may need to avoid solid foods and consume only clear liquids until your condition improves.  Take over-the-counter and prescription medicines only as told by your health care provider.  Keep all follow-up visits as told by your health care provider. This is important. SEEK MEDICAL CARE IF: 2. You have a fever. 3. You have chills. SEEK IMMEDIATE MEDICAL CARE IF: 2. You have increased pain or cramping.  3. You vomit blood. 4. You have uncontrolled vomiting or nausea. 5. You cannot drink fluids because of vomiting or pain. 6. You develop confusion. 7. You begin feeling very dry or thirsty (dehydrated). 8. You have severe bloating. 9. You feel extremely weak or you faint.   This  information is not intended to replace advice given to you by your health care provider. Make sure you discuss any questions you have with your health care provider.

## 2018-10-25 NOTE — Progress Notes (Signed)
Pt alert, oriented, tolerating diet. D/C instructions given, pt dc/d home. 

## 2018-10-25 NOTE — Plan of Care (Signed)
All goals met for d/c 

## 2018-11-02 DIAGNOSIS — Z7984 Long term (current) use of oral hypoglycemic drugs: Secondary | ICD-10-CM | POA: Diagnosis not present

## 2018-11-02 DIAGNOSIS — K56609 Unspecified intestinal obstruction, unspecified as to partial versus complete obstruction: Secondary | ICD-10-CM | POA: Diagnosis not present

## 2018-11-02 DIAGNOSIS — I10 Essential (primary) hypertension: Secondary | ICD-10-CM | POA: Diagnosis not present

## 2018-11-02 DIAGNOSIS — E119 Type 2 diabetes mellitus without complications: Secondary | ICD-10-CM | POA: Diagnosis not present

## 2018-11-30 ENCOUNTER — Emergency Department (HOSPITAL_COMMUNITY)
Admission: EM | Admit: 2018-11-30 | Discharge: 2018-11-30 | Disposition: A | Discharge status: Home / Self Care | Payer: PPO | Attending: Emergency Medicine | Admitting: Emergency Medicine

## 2018-11-30 ENCOUNTER — Other Ambulatory Visit: Payer: Self-pay

## 2018-11-30 ENCOUNTER — Encounter (HOSPITAL_COMMUNITY): Payer: Self-pay | Admitting: Emergency Medicine

## 2018-11-30 ENCOUNTER — Emergency Department (HOSPITAL_COMMUNITY): Payer: PPO

## 2018-11-30 DIAGNOSIS — Z7982 Long term (current) use of aspirin: Secondary | ICD-10-CM | POA: Insufficient documentation

## 2018-11-30 DIAGNOSIS — Z8546 Personal history of malignant neoplasm of prostate: Secondary | ICD-10-CM | POA: Insufficient documentation

## 2018-11-30 DIAGNOSIS — R112 Nausea with vomiting, unspecified: Secondary | ICD-10-CM | POA: Diagnosis not present

## 2018-11-30 DIAGNOSIS — R109 Unspecified abdominal pain: Secondary | ICD-10-CM | POA: Diagnosis not present

## 2018-11-30 DIAGNOSIS — E119 Type 2 diabetes mellitus without complications: Secondary | ICD-10-CM | POA: Insufficient documentation

## 2018-11-30 DIAGNOSIS — R1084 Generalized abdominal pain: Secondary | ICD-10-CM | POA: Diagnosis not present

## 2018-11-30 DIAGNOSIS — Z7984 Long term (current) use of oral hypoglycemic drugs: Secondary | ICD-10-CM | POA: Diagnosis not present

## 2018-11-30 DIAGNOSIS — N3001 Acute cystitis with hematuria: Secondary | ICD-10-CM

## 2018-11-30 DIAGNOSIS — K469 Unspecified abdominal hernia without obstruction or gangrene: Secondary | ICD-10-CM

## 2018-11-30 LAB — URINALYSIS, ROUTINE W REFLEX MICROSCOPIC
Bilirubin Urine: NEGATIVE
Glucose, UA: 50 mg/dL — AB
Ketones, ur: 20 mg/dL — AB
Nitrite: NEGATIVE
Protein, ur: 100 mg/dL — AB
RBC / HPF: >50 RBC/hpf — ABNORMAL HIGH (ref 0–5)
Specific Gravity, Urine: 1.034 — ABNORMAL HIGH (ref 1.005–1.030)
pH: 5 (ref 5.0–8.0)

## 2018-11-30 LAB — CBC
HCT: 43.7 % (ref 39.0–52.0)
Hemoglobin: 14.9 g/dL (ref 13.0–17.0)
MCH: 31.1 pg (ref 26.0–34.0)
MCHC: 34.1 g/dL (ref 30.0–36.0)
MCV: 91.2 fL (ref 80.0–100.0)
Platelets: 292 10*3/uL (ref 150–400)
RBC: 4.79 MIL/uL (ref 4.22–5.81)
RDW: 13.2 % (ref 11.5–15.5)
WBC: 14 10*3/uL — ABNORMAL HIGH (ref 4.0–10.5)
nRBC: 0 % (ref 0.0–0.2)

## 2018-11-30 LAB — COMPREHENSIVE METABOLIC PANEL
ALT: 19 U/L (ref 0–44)
AST: 21 U/L (ref 15–41)
Albumin: 4.8 g/dL (ref 3.5–5.0)
Alkaline Phosphatase: 105 U/L (ref 38–126)
Anion gap: 11 (ref 5–15)
BUN: 21 mg/dL (ref 8–23)
CO2: 26 mmol/L (ref 22–32)
Calcium: 9.5 mg/dL (ref 8.9–10.3)
Chloride: 99 mmol/L (ref 98–111)
Creatinine, Ser: 1.23 mg/dL (ref 0.61–1.24)
GFR calc Af Amer: >60 mL/min (ref >60)
GFR calc non Af Amer: 58 mL/min — ABNORMAL LOW (ref >60)
Glucose, Bld: 261 mg/dL — ABNORMAL HIGH (ref 70–99)
Potassium: 4.6 mmol/L (ref 3.5–5.1)
Sodium: 136 mmol/L (ref 135–145)
Total Bilirubin: 1.2 mg/dL (ref 0.3–1.2)
Total Protein: 8.6 g/dL — ABNORMAL HIGH (ref 6.5–8.1)

## 2018-11-30 LAB — LIPASE, BLOOD: Lipase: 65 U/L — ABNORMAL HIGH (ref 11–51)

## 2018-11-30 MED ORDER — IOHEXOL 300 MG/ML  SOLN
100.0000 mL | Freq: Once | INTRAMUSCULAR | Status: AC | PRN
  Administered 2018-11-30: 17:00:00 100 mL via INTRAVENOUS

## 2018-11-30 MED ORDER — CEPHALEXIN 500 MG PO CAPS
500.0000 mg | ORAL_CAPSULE | Freq: Once | ORAL | Status: AC
  Administered 2018-11-30: 500 mg via ORAL
  Filled 2018-11-30: qty 1

## 2018-11-30 MED ORDER — CEPHALEXIN 500 MG PO CAPS: 500.0000 mg | ORAL_CAPSULE | Freq: Three times a day (TID) | ORAL | 0 refills | Status: AC

## 2018-11-30 NOTE — ED Provider Notes (Signed)
[facility] DEPT Provider Note   CSN: [id number] Arrival date & time: 11/30/18  7322    History   Chief Complaint Chief Complaint  Patient presents with  . Abdominal Pain  . Emesis    HPI KENDARIUS VIGEN is a 73 y.o. male with a past medical history of diabetes, or bowel obstruction presents to ED for abdominal pain, nausea, vomiting.  States that symptoms began yesterday while he was eating dinner.  He initially thought it was due to the salad that he ate.  However, he reported worsening sharp pain in his abdomen with 5 episodes of nonbloody, nonbilious emesis last night.  States that the pain has gradually improved since arrival in the ED.  However he was sent to the ED for concerns for recurrent bowel obstruction.  He had a normal bowel movement on 11/27/2018.  He has been passing small amounts of gas.  He takes MiraLAX daily ever since his prior obstruction in July 2020.  Denies any fever, sick contacts with similar symptoms, urinary symptoms, back pain, shortness of breath.     HPI  Past Medical History:  Diagnosis Date  . Anemia   . Cancer [facility]) 2012   prostate cancer  . Colonic diverticular abscess 10/27/2016  . Diabetes mellitus    controlled with diet and exercise  . Liver abscess   . Pneumonia    as a child    Patient Active Problem List   Diagnosis Date Noted  . Bowel obstruction ([place]) 10/21/2018  . AKI (acute kidney injury) ([place]) 10/21/2018  . DKA, type 2 ([place]) 10/21/2018  . SBO (small bowel obstruction) ([place]) 10/21/2018  . Recurrent ventral incisional hernia s/p lap repair w mesh 05/12/2018 05/12/2018  . Diverticular disease 01/14/2017  . Diverticulitis with abscess s/p robotic sigmoid colectomy 01/14/2017 10/01/2016  . Anemia 09/28/2016  . Diabetes mellitus type 2, uncontrolled ([place]) 07/27/2012  . Narcotic induced constipation 07/27/2012  . Tibial plateau fracture 07/27/2012    Past Surgical History:  Procedure Laterality Date   . COLONOSCOPY WITH PROPOFOL N/A 11/09/2016   Procedure: COLONOSCOPY WITH PROPOFOL;  Surgeon: Laurence Spates, MD;  Location: [facility];  Service: Endoscopy;  Laterality: N/A;  . INSERTION OF MESH N/A 05/12/2018   Procedure: INSERTION OF MESH;  Surgeon: Michael Boston, MD;  Location: [facility];  Service: General;  Laterality: N/A;  . IR RADIOLOGIST EVAL & MGMT  11/04/2016  . ORIF TIBIA PLATEAU Left 07/23/2012   Procedure: OPEN REDUCTION INTERNAL FIXATION (ORIF) TIBIAL PLATEAU;  Surgeon: Sharmon Revere, MD;  Location: [facility];  Service: Orthopedics;  Laterality: Left;  . ROBOT ASSISTED LAPAROSCOPIC RADICAL PROSTATECTOMY  04/06/2011   Procedure: ROBOTIC ASSISTED LAPAROSCOPIC RADICAL PROSTATECTOMY LEVEL 2;  Surgeon: Dutch Gray, MD;  Location: [facility];  Service: Urology;  Laterality: N/A;  Bilateral Pelvic Lymphadenectomy   . TONSILLECTOMY     as a child  . VENTRAL HERNIA REPAIR N/A 05/12/2018   Procedure: LAPAROSCOPIC  COMPARTMENT SEPARATION INCISIONAL HERNIA WITH MESH;  Surgeon: Michael Boston, MD;  Location: [facility];  Service: General;  Laterality: N/A;        Home Medications    Prior to Admission medications   Medication Sig Start Date End Date Taking? Authorizing Provider  acetaminophen (TYLENOL) 325 MG tablet Take 1-2 tablets (325-650 mg total) by mouth every 4 (four) hours as needed. Patient taking differently: Take 325-650 mg by mouth every 4 (four) hours as needed for mild pain or headache.  08/02/12   Love,  Ivan Anchors, PA-C  amLODipine (NORVASC) 5 MG tablet Take 1 tablet (5 mg total) by mouth daily. 10/25/18   Regalado, Belkys A, MD  amoxicillin-clavulanate (AUGMENTIN) 875-125 MG tablet Take 1 tablet by mouth 2 (two) times daily. 10/25/18   Regalado, Jerald Kief A, MD  aspirin EC 81 MG tablet Take 81 mg by mouth daily after breakfast.     [provider]  Calcium Carbonate (CALCIUM-CARB 600 PO) Take 600 mg by mouth daily after breakfast.     [provider]  gabapentin (NEURONTIN) 100  MG capsule Take 2 capsules (200 mg total) by mouth 3 (three) times daily as needed (for pain). 05/12/18   Michael Boston, MD  metFORMIN (GLUCOPHAGE) 500 MG tablet Take 1 tablet (500 mg total) by mouth 2 (two) times daily with a meal. 10/25/18 10/25/19  Regalado, Belkys A, MD  Multiple Vitamin (MULTIVITAMIN WITH MINERALS) TABS Take 1 tablet by mouth daily after breakfast.     [provider]  psyllium (METAMUCIL) 0.52 G capsule Take 1.04 g by mouth daily after breakfast.     [provider]    Family History Family History  Problem Relation Age of Onset  . Lung cancer Mother 20    Social History Social History   Tobacco Use  . Smoking status: Never Smoker  . Smokeless tobacco: Never Used  Substance Use Topics  . Alcohol use: Yes    Comment: occas wine  . Drug use: No     Allergies   Patient has no known allergies.   Review of Systems Review of Systems  Constitutional: Negative for appetite change, chills and fever.  HENT: Negative for ear pain, rhinorrhea, sneezing and sore throat.   Eyes: Negative for photophobia and visual disturbance.  Respiratory: Negative for cough, chest tightness, shortness of breath and wheezing.   Cardiovascular: Negative for chest pain and palpitations.  Gastrointestinal: Positive for abdominal pain, constipation, nausea and vomiting. Negative for blood in stool and diarrhea.  Genitourinary: Negative for dysuria, hematuria and urgency.  Musculoskeletal: Negative for myalgias.  Skin: Negative for rash.  Neurological: Negative for dizziness, weakness and light-headedness.     Physical Exam Updated Vital Signs BP 135/88   Pulse 73   Temp 98.1 F (36.7 C) (Oral)   Resp 18   SpO2 97%   Physical Exam Vitals signs and nursing note reviewed.  Constitutional:      General: He is not in acute distress.    Appearance: He is well-developed.  HENT:     Head: Normocephalic and atraumatic.     Nose: Nose normal.  Eyes:     General:  No scleral icterus.       Left eye: No discharge.     Conjunctiva/sclera: Conjunctivae normal.  Neck:     Musculoskeletal: Normal range of motion and neck supple.  Cardiovascular:     Rate and Rhythm: Normal rate and regular rhythm.     Heart sounds: Normal heart sounds. No murmur. No friction rub. No gallop.   Pulmonary:     Effort: Pulmonary effort is normal. No respiratory distress.     Breath sounds: Normal breath sounds.  Abdominal:     General: Bowel sounds are normal. There is no distension.     Palpations: Abdomen is soft.     Tenderness: There is generalized abdominal tenderness. There is no guarding.  Musculoskeletal: Normal range of motion.  Skin:    General: Skin is warm and dry.     Findings: No rash.  Neurological:     Mental Status: He is alert.     Motor: No abnormal muscle tone.     Coordination: Coordination normal.      ED Treatments / Results  Labs (all labs ordered are listed, but only abnormal results are displayed) Labs Reviewed  LIPASE, BLOOD - Abnormal; Notable for the following components:      Result Value   Lipase 65 (*)    All other components within normal limits  COMPREHENSIVE METABOLIC PANEL - Abnormal; Notable for the following components:   Glucose, Bld 261 (*)    Total Protein 8.6 (*)    GFR calc non Af Amer 58 (*)    All other components within normal limits  CBC - Abnormal; Notable for the following components:   WBC 14.0 (*)    All other components within normal limits  URINALYSIS, ROUTINE W REFLEX MICROSCOPIC - Abnormal; Notable for the following components:   Color, Urine AMBER (*)    APPearance HAZY (*)    Specific Gravity, Urine 1.034 (*)    Glucose, UA 50 (*)    Hgb urine dipstick MODERATE (*)    Ketones, ur 20 (*)    Protein, ur 100 (*)    Leukocytes,Ua TRACE (*)    RBC / HPF >50 (*)    Bacteria, UA RARE (*)    All other components within normal limits    EKG None  Radiology No results found.  Procedures  Procedures (including critical care time)  Medications Ordered in ED Medications - No data to display   Initial Impression / Assessment and Plan / ED Course  I have reviewed the triage vital signs and the nursing notes.  Pertinent labs & imaging results that were available during my care of the patient were reviewed by me and considered in my medical decision making (see chart for details).        73 year old male with a past medical history of prior bowel obstruction presents to ED for abdominal pain, nausea, vomiting and constipation.  States that his last normal bowel movement was on 11/27/2018.  He takes MiraLAX daily ever since his prior bowel obstruction.  He began having sharp abdominal pain since last night while eating dinner.  Reports 5 episodes of nonbloody, nonbilious emesis.  States this actually feels worse than when he was diagnosed with the obstruction in July.  On exam abdomen is generally tender without rebound or guarding.  Vital signs are within normal limits.  Lab work significant for leukocytosis of 14, urinalysis with trace leukocytes, blood in urine, lipase of 65.  Will obtain CT of the abdomen pelvis to evaluate for bowel obstruction as well as other pathology that could cause the symptoms.  Care handed off to oncoming provider pending disposition based on CT results.  Final Clinical Impressions(s) / ED Diagnoses   Final diagnoses:  None    ED Discharge Orders    None       Delia Heady, PA-C 11/30/18 1646    Gareth Morgan, MD 12/01/18 1615

## 2018-11-30 NOTE — ED Notes (Signed)
Patient given ginger ale. 

## 2018-11-30 NOTE — ED Triage Notes (Signed)
Pt reports had bowel obstruction couple weeks ago. Last night started having abd pain with n/v. Dr Johney Maine office referred to ED.

## 2018-11-30 NOTE — ED Notes (Signed)
Patient made aware urine sample is needed. Urinal provided at bedside. 

## 2018-11-30 NOTE — Discharge Instructions (Signed)
As we discussed, your work-up was reassuring.  Given your history, we do want you to follow-up with your general surgeon.  Please call their office tomorrow to arrange for appointment.  Additionally, your blood work looked reassuring.  He did not have evidence of urinary tract infection which could be contributing to your pain.  Take antibiotic as directed.  Return the emergency department for any worsening abdominal pain, fever, vomiting or any other worsening or concerning symptoms.

## 2018-11-30 NOTE — ED Notes (Signed)
ED Provider at bedside. 

## 2018-11-30 NOTE — ED Provider Notes (Signed)
Care assumed from [facility], PA-C at shift change with CT abd/pelvis pending.   In brief, this patient is a 73 y.o. M past medical history is of diabetes, bowel obstruction who presents for evaluation of abdominal pain, nausea/vomiting that began yesterday.  He initially thought it was due to a salad that he ate but states that symptoms have continued to persist.  Reports several episodes of nonbloody, nonbilious emesis.  He is still passing small amount of flatulence.  Last bowel movement was 11/27/2018.  Please see note from previous provider for full history/physical exam.    Physical Exam  BP (!) 145/78   Pulse (!) 59   Temp 98.1 F (36.7 C) (Oral)   Resp 18   SpO2 97%   Physical Exam   Abdomen is soft, nondistended.  No tenderness noted.  ED Course/Procedures     Procedures  MDM    PLAN: Patient pending CT abdomen pelvis.  If unremarkable, plan to dispel home.  MDM: CMP shows glucose of 261.  Lipase slightly elevated at 65.  CBC shows leukocytosis of 14.0.  UA shows moderate hemoglobin, trace leukocytes, pyuria.  CT abd/pelvis shows some circumferential mural thickening of multiple loops of mid abdominal small bowel and portion of the ascending colon with adjacent mesenteric stranding and edema, consistent with enterocolitis. Small bowel containing ventral hernia with no evidence of mechanical obstruction or vascular compromise at this level. Small right perihepatic fluid collection, nonspecific. Large stool ball in the rectum beyond the level of the rectosigmoid.   Reevaluation.  Patient is resting comfortably.  He states that his pain is improved.  Repeat abdominal exam shows abdomen is soft, nontender.  Plan to p.o. challenge.  Discussed patient with Dr. Billy Fischer.  Given hernia seen on CT scan, will contact surgery to see what the recommendations are.  Dr. Feliberto Harts discussed return concerned.  Feels that patient is stable for outpatient follow-up given that he is  pain-free and is reassuring exam at this time.  Additionally, given patient's urine, will plan to treat him for UTI.  Patient with no known drug allergies.  Updated patient on plan.  He was able to tolerate p.o. without any difficulty.  Reports no abdominal pain at this time.  Patient stable for discharge at this time.  Encouraged close follow-up with general surgeon as directed.  Additionally, instructed him to closely monitor his symptoms return emergency department any worsening pain. At this time, patient exhibits no emergent life-threatening condition that require further evaluation in ED or admission. Patient had ample opportunity for questions and discussion. All patient's questions were answered with full understanding. Strict return precautions discussed. Patient expresses understanding and agreement to plan.    1. Generalized abdominal pain   2. Acute cystitis with hematuria   3. Abdominal hernia without obstruction and without gangrene, recurrence not specified, unspecified hernia type      Portions of this note were generated with [employer]. Dictation errors may occur despite best attempts at proofreading.    Volanda Napoleon, PA-C 12/01/18 [id number]    Gareth Morgan, MD 12/01/18 1625

## 2018-12-14 DIAGNOSIS — R3121 Asymptomatic microscopic hematuria: Secondary | ICD-10-CM | POA: Diagnosis not present

## 2018-12-14 DIAGNOSIS — C61 Malignant neoplasm of prostate: Secondary | ICD-10-CM | POA: Diagnosis not present

## 2018-12-19 DIAGNOSIS — K529 Noninfective gastroenteritis and colitis, unspecified: Secondary | ICD-10-CM | POA: Diagnosis not present

## 2018-12-19 DIAGNOSIS — Z8546 Personal history of malignant neoplasm of prostate: Secondary | ICD-10-CM | POA: Diagnosis not present

## 2018-12-19 DIAGNOSIS — K5730 Diverticulosis of large intestine without perforation or abscess without bleeding: Secondary | ICD-10-CM | POA: Diagnosis not present

## 2018-12-19 DIAGNOSIS — Z87440 Personal history of urinary (tract) infections: Secondary | ICD-10-CM | POA: Diagnosis not present

## 2018-12-19 DIAGNOSIS — Z8719 Personal history of other diseases of the digestive system: Secondary | ICD-10-CM | POA: Diagnosis not present

## 2018-12-19 DIAGNOSIS — Z9889 Other specified postprocedural states: Secondary | ICD-10-CM | POA: Diagnosis not present

## 2019-02-17 DIAGNOSIS — M1990 Unspecified osteoarthritis, unspecified site: Secondary | ICD-10-CM | POA: Diagnosis not present

## 2019-02-17 DIAGNOSIS — E119 Type 2 diabetes mellitus without complications: Secondary | ICD-10-CM | POA: Diagnosis not present

## 2019-02-17 DIAGNOSIS — K5900 Constipation, unspecified: Secondary | ICD-10-CM | POA: Diagnosis not present

## 2019-02-17 DIAGNOSIS — Z23 Encounter for immunization: Secondary | ICD-10-CM | POA: Diagnosis not present

## 2019-02-17 DIAGNOSIS — I10 Essential (primary) hypertension: Secondary | ICD-10-CM | POA: Diagnosis not present

## 2019-03-27 DIAGNOSIS — H25812 Combined forms of age-related cataract, left eye: Secondary | ICD-10-CM | POA: Diagnosis not present

## 2019-03-27 DIAGNOSIS — H35372 Puckering of macula, left eye: Secondary | ICD-10-CM | POA: Diagnosis not present

## 2019-03-27 DIAGNOSIS — E119 Type 2 diabetes mellitus without complications: Secondary | ICD-10-CM | POA: Diagnosis not present

## 2019-03-27 DIAGNOSIS — Z01818 Encounter for other preprocedural examination: Secondary | ICD-10-CM | POA: Diagnosis not present

## 2019-03-27 DIAGNOSIS — H25811 Combined forms of age-related cataract, right eye: Secondary | ICD-10-CM | POA: Diagnosis not present

## 2019-04-27 DIAGNOSIS — H25811 Combined forms of age-related cataract, right eye: Secondary | ICD-10-CM | POA: Diagnosis not present

## 2019-04-27 DIAGNOSIS — H2511 Age-related nuclear cataract, right eye: Secondary | ICD-10-CM | POA: Diagnosis not present

## 2019-05-01 DIAGNOSIS — M1712 Unilateral primary osteoarthritis, left knee: Secondary | ICD-10-CM | POA: Diagnosis not present

## 2019-05-18 DIAGNOSIS — H25812 Combined forms of age-related cataract, left eye: Secondary | ICD-10-CM | POA: Diagnosis not present

## 2019-05-18 DIAGNOSIS — H2512 Age-related nuclear cataract, left eye: Secondary | ICD-10-CM | POA: Diagnosis not present

## 2019-08-18 DIAGNOSIS — K5900 Constipation, unspecified: Secondary | ICD-10-CM | POA: Diagnosis not present

## 2019-08-18 DIAGNOSIS — M1990 Unspecified osteoarthritis, unspecified site: Secondary | ICD-10-CM | POA: Diagnosis not present

## 2019-08-18 DIAGNOSIS — E119 Type 2 diabetes mellitus without complications: Secondary | ICD-10-CM | POA: Diagnosis not present

## 2019-08-18 DIAGNOSIS — I10 Essential (primary) hypertension: Secondary | ICD-10-CM | POA: Diagnosis not present

## 2019-08-21 ENCOUNTER — Other Ambulatory Visit: Payer: Self-pay

## 2019-08-21 ENCOUNTER — Emergency Department (HOSPITAL_COMMUNITY)
Admission: EM | Admit: 2019-08-21 | Discharge: 2019-08-21 | Disposition: A | Discharge status: Home / Self Care | Payer: PPO | Attending: Emergency Medicine | Admitting: Emergency Medicine

## 2019-08-21 ENCOUNTER — Emergency Department (HOSPITAL_COMMUNITY): Payer: PPO

## 2019-08-21 ENCOUNTER — Encounter (HOSPITAL_COMMUNITY): Payer: Self-pay | Admitting: *Deleted

## 2019-08-21 DIAGNOSIS — Z79899 Other long term (current) drug therapy: Secondary | ICD-10-CM | POA: Insufficient documentation

## 2019-08-21 DIAGNOSIS — K8020 Calculus of gallbladder without cholecystitis without obstruction: Secondary | ICD-10-CM | POA: Diagnosis not present

## 2019-08-21 DIAGNOSIS — E119 Type 2 diabetes mellitus without complications: Secondary | ICD-10-CM | POA: Insufficient documentation

## 2019-08-21 DIAGNOSIS — Z8546 Personal history of malignant neoplasm of prostate: Secondary | ICD-10-CM | POA: Insufficient documentation

## 2019-08-21 DIAGNOSIS — Z7982 Long term (current) use of aspirin: Secondary | ICD-10-CM | POA: Insufficient documentation

## 2019-08-21 DIAGNOSIS — N1330 Unspecified hydronephrosis: Secondary | ICD-10-CM | POA: Diagnosis not present

## 2019-08-21 DIAGNOSIS — R1032 Left lower quadrant pain: Secondary | ICD-10-CM | POA: Diagnosis not present

## 2019-08-21 LAB — URINALYSIS, ROUTINE W REFLEX MICROSCOPIC
Bacteria, UA: NONE SEEN
Bilirubin Urine: NEGATIVE
Glucose, UA: >=500 mg/dL — AB
Hgb urine dipstick: NEGATIVE
Ketones, ur: 20 mg/dL — AB
Leukocytes,Ua: NEGATIVE
Nitrite: NEGATIVE
Protein, ur: NEGATIVE mg/dL
Specific Gravity, Urine: 1.01 (ref 1.005–1.030)
pH: 8 (ref 5.0–8.0)

## 2019-08-21 LAB — CBC
HCT: 40 % (ref 39.0–52.0)
Hemoglobin: 14.1 g/dL (ref 13.0–17.0)
MCH: 32.3 pg (ref 26.0–34.0)
MCHC: 35.3 g/dL (ref 30.0–36.0)
MCV: 91.7 fL (ref 80.0–100.0)
Platelets: 219 10*3/uL (ref 150–400)
RBC: 4.36 MIL/uL (ref 4.22–5.81)
RDW: 12.8 % (ref 11.5–15.5)
WBC: 10.7 10*3/uL — ABNORMAL HIGH (ref 4.0–10.5)
nRBC: 0 % (ref 0.0–0.2)

## 2019-08-21 LAB — COMPREHENSIVE METABOLIC PANEL
ALT: 23 U/L (ref 0–44)
AST: 27 U/L (ref 15–41)
Albumin: 5 g/dL (ref 3.5–5.0)
Alkaline Phosphatase: 96 U/L (ref 38–126)
Anion gap: 10 (ref 5–15)
BUN: 26 mg/dL — ABNORMAL HIGH (ref 8–23)
CO2: 26 mmol/L (ref 22–32)
Calcium: 9.9 mg/dL (ref 8.9–10.3)
Chloride: 105 mmol/L (ref 98–111)
Creatinine, Ser: 1.26 mg/dL — ABNORMAL HIGH (ref 0.61–1.24)
GFR calc Af Amer: >60 mL/min (ref >60)
GFR calc non Af Amer: 56 mL/min — ABNORMAL LOW (ref >60)
Glucose, Bld: 267 mg/dL — ABNORMAL HIGH (ref 70–99)
Potassium: 4.7 mmol/L (ref 3.5–5.1)
Sodium: 141 mmol/L (ref 135–145)
Total Bilirubin: 0.8 mg/dL (ref 0.3–1.2)
Total Protein: 8.3 g/dL — ABNORMAL HIGH (ref 6.5–8.1)

## 2019-08-21 LAB — LIPASE, BLOOD: Lipase: 77 U/L — ABNORMAL HIGH (ref 11–51)

## 2019-08-21 MED ORDER — SODIUM CHLORIDE 0.9 % IV BOLUS
1000.0000 mL | Freq: Once | INTRAVENOUS | Status: AC
  Administered 2019-08-21: 22:00:00 1000 mL via INTRAVENOUS

## 2019-08-21 MED ORDER — SODIUM CHLORIDE 0.9% FLUSH
3.0000 mL | Freq: Once | INTRAVENOUS | Status: AC
  Administered 2019-08-21: 22:00:00 3 mL via INTRAVENOUS

## 2019-08-21 MED ORDER — IOHEXOL 300 MG/ML  SOLN
100.0000 mL | Freq: Once | INTRAMUSCULAR | Status: AC | PRN
  Administered 2019-08-21: 22:00:00 100 mL via INTRAVENOUS

## 2019-08-21 MED ORDER — FENTANYL CITRATE (PF) 100 MCG/2ML IJ SOLN
50.0000 ug | Freq: Once | INTRAMUSCULAR | Status: AC
  Administered 2019-08-21: 22:00:00 50 ug via INTRAVENOUS
  Filled 2019-08-21: qty 2

## 2019-08-21 MED ORDER — SODIUM CHLORIDE (PF) 0.9 % IJ SOLN
INTRAMUSCULAR | Status: AC
  Filled 2019-08-21: qty 50

## 2019-08-21 NOTE — Discharge Instructions (Addendum)
If you develop worsening, continued, or recurrent abdominal pain, uncontrolled vomiting, fever, chest or back pain, or any other new/concerning symptoms then return to the ER for evaluation.   Your pain is probably from a passed kidney stone.  If you develop urinary tract infection symptoms, fever, vomiting, or new or worsening pain then call 911 or return to the ER for evaluation.  Your CT scan also shows a "focally thickened loop of small bowel" that will need to be followed up as an outpatient.  Follow-up with your gastroenterologist for this.

## 2019-08-21 NOTE — ED Triage Notes (Signed)
Left sided abdominal pain that started today, 4 episodes of vomiting. LBM this morning, reports as normal. Reports had a fever on Friday night, subsided.

## 2019-08-21 NOTE — ED Provider Notes (Signed)
[place] DEPT Provider Note   CSN: [id number] Arrival date & time: 08/21/19  1847     History Chief Complaint  Patient presents with  . Abdominal Pain    Billy Horn is a 74 y.o. male.  HPI 74 year old male presents with acute lower abdominal pain.  Is in his left lower abdomen and started around 3 PM.  He has had some dry heaving.  No change in bowel movements, blood in stool, or hematuria.  Some slight discomfort when urinating.  No back or flank pain.  No fevers.  Has never really had pain like this before.   Past Medical History:  Diagnosis Date  . Anemia   . Cancer [facility]) 2012   prostate cancer  . Colonic diverticular abscess 10/27/2016  . Diabetes mellitus    controlled with diet and exercise  . Liver abscess   . Pneumonia    as a child    Patient Active Problem List   Diagnosis Date Noted  . Bowel obstruction ([place]) 10/21/2018  . AKI (acute kidney injury) ([place]) 10/21/2018  . DKA, type 2 ([place]) 10/21/2018  . SBO (small bowel obstruction) ([place]) 10/21/2018  . Recurrent ventral incisional hernia s/p lap repair w mesh 05/12/2018 05/12/2018  . Diverticular disease 01/14/2017  . Diverticulitis with abscess s/p robotic sigmoid colectomy 01/14/2017 10/01/2016  . Anemia 09/28/2016  . Diabetes mellitus type 2, uncontrolled ([place]) 07/27/2012  . Narcotic induced constipation 07/27/2012  . Tibial plateau fracture 07/27/2012    Past Surgical History:  Procedure Laterality Date  . COLONOSCOPY WITH PROPOFOL N/A 11/09/2016   Procedure: COLONOSCOPY WITH PROPOFOL;  Surgeon: Laurence Spates, MD;  Location: [place];  Service: Endoscopy;  Laterality: N/A;  . INSERTION OF MESH N/A 05/12/2018   Procedure: INSERTION OF MESH;  Surgeon: Michael Boston, MD;  Location: [facility];  Service: General;  Laterality: N/A;  . IR RADIOLOGIST EVAL & MGMT  11/04/2016  . ORIF TIBIA PLATEAU Left 07/23/2012   Procedure: OPEN REDUCTION INTERNAL FIXATION (ORIF) TIBIAL PLATEAU;   Surgeon: Sharmon Revere, MD;  Location: [facility];  Service: Orthopedics;  Laterality: Left;  . ROBOT ASSISTED LAPAROSCOPIC RADICAL PROSTATECTOMY  04/06/2011   Procedure: ROBOTIC ASSISTED LAPAROSCOPIC RADICAL PROSTATECTOMY LEVEL 2;  Surgeon: Dutch Gray, MD;  Location: [facility];  Service: Urology;  Laterality: N/A;  Bilateral Pelvic Lymphadenectomy   . TONSILLECTOMY     as a child  . VENTRAL HERNIA REPAIR N/A 05/12/2018   Procedure: LAPAROSCOPIC  COMPARTMENT SEPARATION INCISIONAL HERNIA WITH MESH;  Surgeon: Michael Boston, MD;  Location: [facility];  Service: General;  Laterality: N/A;       Family History  Problem Relation Age of Onset  . Lung cancer Mother 86    Social History   Tobacco Use  . Smoking status: Never Smoker  . Smokeless tobacco: Never Used  Substance Use Topics  . Alcohol use: Yes    Comment: occas wine  . Drug use: No    Home Medications Prior to Admission medications   Medication Sig Start Date End Date Taking? Authorizing Provider  acetaminophen (TYLENOL) 325 MG tablet Take 1-2 tablets (325-650 mg total) by mouth every 4 (four) hours as needed. Patient taking differently: Take 325-650 mg by mouth every 4 (four) hours as needed for mild pain or headache.  08/02/12  Yes Love, Ivan Anchors, PA-C  amLODipine (NORVASC) 5 MG tablet Take 1 tablet (5 mg total) by mouth daily. 10/25/18  Yes Regalado, Cassie Freer, MD  aspirin EC  81 MG tablet Take 81 mg by mouth daily after breakfast.    Yes [provider]  Calcium Carbonate (CALCIUM-CARB 600 PO) Take 600 mg by mouth daily after breakfast.    Yes [provider]  meloxicam (MOBIC) 15 MG tablet Take 15 mg by mouth daily. 07/26/19  Yes [provider]  metFORMIN (GLUCOPHAGE) 500 MG tablet Take 1 tablet (500 mg total) by mouth 2 (two) times daily with a meal. 10/25/18 10/25/19 Yes Regalado, Belkys A, MD  Multiple Vitamin (MULTIVITAMIN WITH MINERALS) TABS Take 1 tablet by mouth daily after breakfast.    Yes [provider]  psyllium (METAMUCIL) 0.52 G capsule Take 1.04 g by mouth daily after breakfast.    Yes [provider]  amoxicillin-clavulanate (AUGMENTIN) 875-125 MG tablet Take 1 tablet by mouth 2 (two) times daily. Patient not taking: Reported on 11/30/2018 10/25/18   Regalado, Jerald Kief A, MD  gabapentin (NEURONTIN) 100 MG capsule Take 2 capsules (200 mg total) by mouth 3 (three) times daily as needed (for pain). Patient not taking: Reported on 11/30/2018 05/12/18   Michael Boston, MD    Allergies    Patient has no known allergies.  Review of Systems   Review of Systems  Constitutional: Negative for fever.  Gastrointestinal: Positive for abdominal pain and vomiting. Negative for blood in stool.  Genitourinary: Negative for flank pain and hematuria.  Musculoskeletal: Negative for back pain.  All other systems reviewed and are negative.   Physical Exam Updated Vital Signs BP (!) 149/73   Pulse 69   Temp 98.3 F (36.8 C) (Oral)   Resp 17   Ht 6' (1.829 m)   Wt 90.7 kg   SpO2 95%   BMI 27.12 kg/m   Physical Exam Vitals and nursing note reviewed.  Constitutional:      General: He is not in acute distress.    Appearance: He is well-developed. He is not ill-appearing or diaphoretic.  HENT:     Head: Normocephalic and atraumatic.     Right Ear: External ear normal.     Left Ear: External ear normal.     Nose: Nose normal.  Eyes:     General:        Right eye: No discharge.        Left eye: No discharge.  Cardiovascular:     Rate and Rhythm: Normal rate and regular rhythm.     Heart sounds: Normal heart sounds.  Pulmonary:     Effort: Pulmonary effort is normal.     Breath sounds: Normal breath sounds.  Abdominal:     Palpations: Abdomen is soft.     Tenderness: There is abdominal tenderness (mild) in the left lower quadrant. There is no right CVA tenderness or left CVA tenderness.  Musculoskeletal:     Cervical back: Neck supple.  Skin:    General: Skin is warm  and dry.  Neurological:     Mental Status: He is alert.  Psychiatric:        Mood and Affect: Mood is not anxious.     ED Results / Procedures / Treatments   Labs (all labs ordered are listed, but only abnormal results are displayed) Labs Reviewed  LIPASE, BLOOD - Abnormal; Notable for the following components:      Result Value   Lipase 77 (*)    All other components within normal limits  COMPREHENSIVE METABOLIC PANEL - Abnormal; Notable for the following components:   Glucose, Bld 267 (*)  BUN 26 (*)    Creatinine, Ser 1.26 (*)    Total Protein 8.3 (*)    GFR calc non Af Amer 56 (*)    All other components within normal limits  CBC - Abnormal; Notable for the following components:   WBC 10.7 (*)    All other components within normal limits  URINALYSIS, ROUTINE W REFLEX MICROSCOPIC - Abnormal; Notable for the following components:   Color, Urine STRAW (*)    Glucose, UA >=500 (*)    Ketones, ur 20 (*)    All other components within normal limits    EKG None  Radiology CT ABDOMEN PELVIS W CONTRAST  Result Date: 08/21/2019 CLINICAL DATA:  Left lower quadrant pain, began this afternoon EXAM: CT ABDOMEN AND PELVIS WITH CONTRAST TECHNIQUE: Multidetector CT imaging of the abdomen and pelvis was performed using the standard protocol following bolus administration of intravenous contrast. CONTRAST:  124mL OMNIPAQUE IOHEXOL 300 MG/ML  SOLN COMPARISON:  CT November 30, 2018 FINDINGS: Lower chest: Bibasilar atelectasis. Lung bases otherwise clear. Normal heart size. No pericardial effusion. Coronary artery calcifications are present. Hepatobiliary: No focal liver lesions. Smooth liver surface contour. Normal hepatic attenuation. Hyperdense material layering dependently within the gallbladder may reflect tiny biliary stones/biliary sand. Gallbladder is otherwise unremarkable. No biliary ductal dilatation or visible calcified intraductal stones. Pancreas: Single punctate calcification in  the pancreatic body, possibly postinflammatory. No pancreatic ductal dilatation or surrounding inflammatory changes. Spleen: Normal in size without focal abnormality. Adrenals/Urinary Tract: Normal adrenal glands. There is asymmetrically increased left perinephric stranding, increased from comparison study with mild hydroureteronephrosis and slight urothelial thickening to the level of the urinary bladder. Elongated 1.5 cm calcification is seen in the left lower pole collecting system with additional punctate lower pole calculi as well. No right urolithiasis or hydronephrosis. Numerous cystic lesions are seen throughout both kidneys many of which are fluid attenuation though there are several small smaller lesions which are more hyperdense including a subcapsular focus on the interpolar left kidney (2/36) and 2 slightly larger lower pole lesions in the right kidney measuring 11 mm (2/40) and 15 mm (2/42) in size. Additional subcentimeter hypoattenuating foci in both kidneys too small to fully characterize on CT imaging but statistically likely benign. Urinary bladder appears fairly unremarkable. Stomach/Bowel: Small hiatal hernia. Distal stomach and duodenum are unremarkable. There is a focally thickened loop of small bowel in the left lower quadrant with some adjacent stranding and possible serous changes of the mesentery there is a small air-filled outpouching or from this loop (4/54) which closely approximates a more distal small bowel segment. There are several loops of small bowel which appear to protrude partially into the fascial sheath of the anterior abdominal wall. With multiple loops closely adhered to the lax anterior abdominal wall. No resulting mechanical obstruction is seen at this time however. A normal appendix is seen in the right quadrant. There is faint nonspecific mesenteric haze about the hepatic flexure, not significantly changed from prior. Patent distal colonic anastomosis. No colonic  dilatation or wall thickening. Scattered colonic diverticula without focal pericolonic inflammation to suggest diverticulitis. Large stool ball in the rectum without wall thickening or adjacent stranding. Vascular/Lymphatic: Atherosclerotic plaque within the normal caliber aorta. No suspicious or enlarged lymph nodes in the included lymphatic chains. Reproductive: Prior prostatectomy. Other: Complex abdominal wall hernia, as described above which has an atypical appearance and appears to only partially protrudes through the rectus sheath or possibly through a site of prior repair, 2.6 cm hernia  defect is best seen sagittal series 5, image 101. No resulting mechanical obstruction at this time. Left retroperitoneal stranding and inflammatory changes, as above. No other abdominopelvic free fluid or air. Musculoskeletal: Multilevel degenerative changes are present in the imaged portions of the spine. No acute osseous abnormality or suspicious osseous lesion. IMPRESSION: 1. Asymmetrically increased left perinephric stranding with some mild hydroureteronephrosis but no obstructing calculus visualized at this time. Could reflect passage of calculus or ascending tract infection. Correlate with urinalysis. 2. There is a focally thickened loop of small bowel in the left lower quadrant with some adjacent stranding and possible scirrhous changes of the mesentery. There is a small air-filled outpouching or from this loop which closely approximates a more distal small bowel segment and may reflect a diverticulum or sequela prior perforation. Finding could reflect a focal enteritis though given this finding is in a similar location to the prior study, malignancy is not fully excluded. 3. Complex herniation of multiple loops of bowel partially into the rectus sheath or beyond a prior abdominal wall hernia repair, best visualized on sagittal imaging. No resulting mechanical obstruction. 4. Multiple cystic lesions throughout both  kidneys, many of fluid attenuation though several more hyperdense foci are present but do appear unchanged from comparison studies, possibly hyperdense cyst. Could consider outpatient ultrasound not obtained previously. 5. Cholelithiasis without evidence of acute cholecystitis. 6. Aortic Atherosclerosis (ICD10-I70.0). These results were called by telephone at the time of interpretation on 08/21/2019 at 10:47 pm to provider Sherwood Gambler , who verbally acknowledged these results. Electronically Signed   By: Lovena Le M.D.   On: 08/21/2019 22:48    Procedures Procedures (including critical care time)  Medications Ordered in ED Medications  sodium chloride flush (NS) 0.9 % injection 3 mL (3 mLs Intravenous Given 08/21/19 2156)  sodium chloride 0.9 % bolus 1,000 mL (0 mLs Intravenous Stopped 08/21/19 2347)  fentaNYL (SUBLIMAZE) injection 50 mcg (50 mcg Intravenous Given 08/21/19 2155)  iohexol (OMNIPAQUE) 300 MG/ML solution 100 mL (100 mLs Intravenous Contrast Given 08/21/19 2208)  sodium chloride (PF) 0.9 % injection (  Given by Other 08/21/19 2225)    ED Course  I have reviewed the triage vital signs and the nursing notes.  Pertinent labs & imaging results that were available during my care of the patient were reviewed by me and considered in my medical decision making (see chart for details).    MDM Rules/Calculators/A&P                      Patient's sudden left lower quadrant pain probably was a ureteral stone.  There is no obvious obstructing stone at this time but with unilateral hydronephrosis this is the most likely cause.  Doubt a sending infection given no current dysuria/urinary tract infection symptoms.  Urinalysis is benign.  He is feeling much better at this time.  Also made him aware of the abnormal small bowel segment that seems similar to last time.  I doubt this is the symptomatic cause of his pain but will need further work-up to make sure this is not cancer.  Referred to his  gastroenterologist.  Otherwise he appears stable for discharge home and requests no pain medicine prescription but will take Tylenol. Final Clinical Impression(s) / ED Diagnoses Final diagnoses:  Left lower quadrant abdominal pain    Rx / DC Orders ED Discharge Orders    None       Sherwood Gambler, MD 08/22/19 [phone]

## 2019-08-22 DIAGNOSIS — N200 Calculus of kidney: Secondary | ICD-10-CM | POA: Diagnosis not present

## 2019-08-25 DIAGNOSIS — R3121 Asymptomatic microscopic hematuria: Secondary | ICD-10-CM | POA: Diagnosis not present

## 2019-08-25 DIAGNOSIS — N200 Calculus of kidney: Secondary | ICD-10-CM | POA: Diagnosis not present

## 2019-08-25 DIAGNOSIS — N130 Hydronephrosis with ureteropelvic junction obstruction: Secondary | ICD-10-CM | POA: Diagnosis not present

## 2019-09-11 ENCOUNTER — Other Ambulatory Visit: Payer: Self-pay | Admitting: Physician Assistant

## 2019-09-11 DIAGNOSIS — K5900 Constipation, unspecified: Secondary | ICD-10-CM | POA: Diagnosis not present

## 2019-09-11 DIAGNOSIS — R935 Abnormal findings on diagnostic imaging of other abdominal regions, including retroperitoneum: Secondary | ICD-10-CM

## 2019-09-27 ENCOUNTER — Ambulatory Visit
Admission: RE | Admit: 2019-09-27 | Discharge: 2019-09-27 | Disposition: A | Discharge status: Home / Self Care | Payer: PPO | Source: Ambulatory Visit | Attending: Physician Assistant | Admitting: Physician Assistant

## 2019-09-27 DIAGNOSIS — R109 Unspecified abdominal pain: Secondary | ICD-10-CM | POA: Diagnosis not present

## 2019-09-27 DIAGNOSIS — R935 Abnormal findings on diagnostic imaging of other abdominal regions, including retroperitoneum: Secondary | ICD-10-CM

## 2019-09-27 MED ORDER — IOPAMIDOL (ISOVUE-300) INJECTION 61%
100.0000 mL | Freq: Once | INTRAVENOUS | Status: AC | PRN
  Administered 2019-09-27: 100 mL via INTRAVENOUS

## 2020-01-16 DIAGNOSIS — C61 Malignant neoplasm of prostate: Secondary | ICD-10-CM | POA: Diagnosis not present

## 2020-01-16 DIAGNOSIS — R3121 Asymptomatic microscopic hematuria: Secondary | ICD-10-CM | POA: Diagnosis not present

## 2020-01-16 DIAGNOSIS — N5201 Erectile dysfunction due to arterial insufficiency: Secondary | ICD-10-CM | POA: Diagnosis not present

## 2020-01-23 ENCOUNTER — Ambulatory Visit: Payer: PPO | Attending: Internal Medicine

## 2020-01-23 DIAGNOSIS — Z23 Encounter for immunization: Secondary | ICD-10-CM

## 2020-01-23 NOTE — Progress Notes (Signed)
   Covid-19 Vaccination Clinic  Name:  Billy Horn    MRN: [id number] DOB: Aug 21, 1945  01/23/2020  Billy Horn was observed post Covid-19 immunization for 15 minutes without incident. He was provided with Vaccine Information Sheet and instruction to access the V-Safe system.   Billy Horn was instructed to call 911 with any severe reactions post vaccine: Marland Kitchen Difficulty breathing  . Swelling of face and throat  . A fast heartbeat  . A bad rash all over body  . Dizziness and weakness

## 2020-02-24 DIAGNOSIS — Z23 Encounter for immunization: Secondary | ICD-10-CM | POA: Diagnosis not present

## 2020-03-21 DIAGNOSIS — K5900 Constipation, unspecified: Secondary | ICD-10-CM | POA: Diagnosis not present

## 2020-03-21 DIAGNOSIS — I700 Atherosclerosis of aorta: Secondary | ICD-10-CM | POA: Diagnosis not present

## 2020-03-21 DIAGNOSIS — Z0000 Encounter for general adult medical examination without abnormal findings: Secondary | ICD-10-CM | POA: Diagnosis not present

## 2020-03-21 DIAGNOSIS — Z23 Encounter for immunization: Secondary | ICD-10-CM | POA: Diagnosis not present

## 2020-03-21 DIAGNOSIS — I10 Essential (primary) hypertension: Secondary | ICD-10-CM | POA: Diagnosis not present

## 2020-03-21 DIAGNOSIS — M1990 Unspecified osteoarthritis, unspecified site: Secondary | ICD-10-CM | POA: Diagnosis not present

## 2020-03-21 DIAGNOSIS — E1159 Type 2 diabetes mellitus with other circulatory complications: Secondary | ICD-10-CM | POA: Diagnosis not present

## 2020-07-05 DIAGNOSIS — E119 Type 2 diabetes mellitus without complications: Secondary | ICD-10-CM | POA: Diagnosis not present

## 2020-09-19 DIAGNOSIS — K5900 Constipation, unspecified: Secondary | ICD-10-CM | POA: Diagnosis not present

## 2020-09-19 DIAGNOSIS — I10 Essential (primary) hypertension: Secondary | ICD-10-CM | POA: Diagnosis not present

## 2020-09-19 DIAGNOSIS — E1159 Type 2 diabetes mellitus with other circulatory complications: Secondary | ICD-10-CM | POA: Diagnosis not present

## 2020-09-19 DIAGNOSIS — M1990 Unspecified osteoarthritis, unspecified site: Secondary | ICD-10-CM | POA: Diagnosis not present

## 2020-09-19 DIAGNOSIS — R809 Proteinuria, unspecified: Secondary | ICD-10-CM | POA: Diagnosis not present

## 2020-09-19 DIAGNOSIS — G4700 Insomnia, unspecified: Secondary | ICD-10-CM | POA: Diagnosis not present

## 2021-01-15 DIAGNOSIS — C61 Malignant neoplasm of prostate: Secondary | ICD-10-CM | POA: Diagnosis not present

## 2021-01-15 DIAGNOSIS — N5201 Erectile dysfunction due to arterial insufficiency: Secondary | ICD-10-CM | POA: Diagnosis not present

## 2021-04-15 DIAGNOSIS — E1159 Type 2 diabetes mellitus with other circulatory complications: Secondary | ICD-10-CM | POA: Diagnosis not present

## 2021-04-15 DIAGNOSIS — I10 Essential (primary) hypertension: Secondary | ICD-10-CM | POA: Diagnosis not present

## 2021-04-15 DIAGNOSIS — G4700 Insomnia, unspecified: Secondary | ICD-10-CM | POA: Diagnosis not present

## 2021-04-15 DIAGNOSIS — K5900 Constipation, unspecified: Secondary | ICD-10-CM | POA: Diagnosis not present

## 2021-04-15 DIAGNOSIS — Z23 Encounter for immunization: Secondary | ICD-10-CM | POA: Diagnosis not present

## 2021-04-15 DIAGNOSIS — I700 Atherosclerosis of aorta: Secondary | ICD-10-CM | POA: Diagnosis not present

## 2021-04-15 DIAGNOSIS — R809 Proteinuria, unspecified: Secondary | ICD-10-CM | POA: Diagnosis not present

## 2021-04-15 DIAGNOSIS — Z0000 Encounter for general adult medical examination without abnormal findings: Secondary | ICD-10-CM | POA: Diagnosis not present

## 2021-04-15 DIAGNOSIS — M1990 Unspecified osteoarthritis, unspecified site: Secondary | ICD-10-CM | POA: Diagnosis not present

## 2021-07-15 DIAGNOSIS — E119 Type 2 diabetes mellitus without complications: Secondary | ICD-10-CM | POA: Diagnosis not present

## 2021-10-14 DIAGNOSIS — K5900 Constipation, unspecified: Secondary | ICD-10-CM | POA: Diagnosis not present

## 2021-10-14 DIAGNOSIS — I10 Essential (primary) hypertension: Secondary | ICD-10-CM | POA: Diagnosis not present

## 2021-10-14 DIAGNOSIS — E1159 Type 2 diabetes mellitus with other circulatory complications: Secondary | ICD-10-CM | POA: Diagnosis not present

## 2021-10-14 DIAGNOSIS — G4700 Insomnia, unspecified: Secondary | ICD-10-CM | POA: Diagnosis not present

## 2021-10-14 DIAGNOSIS — M1990 Unspecified osteoarthritis, unspecified site: Secondary | ICD-10-CM | POA: Diagnosis not present

## 2021-10-14 DIAGNOSIS — R809 Proteinuria, unspecified: Secondary | ICD-10-CM | POA: Diagnosis not present

## 2022-01-30 DIAGNOSIS — N5201 Erectile dysfunction due to arterial insufficiency: Secondary | ICD-10-CM | POA: Diagnosis not present

## 2022-01-30 DIAGNOSIS — C61 Malignant neoplasm of prostate: Secondary | ICD-10-CM | POA: Diagnosis not present

## 2022-04-23 DIAGNOSIS — Z1211 Encounter for screening for malignant neoplasm of colon: Secondary | ICD-10-CM | POA: Diagnosis not present

## 2022-04-23 DIAGNOSIS — M1712 Unilateral primary osteoarthritis, left knee: Secondary | ICD-10-CM | POA: Diagnosis not present

## 2022-04-23 DIAGNOSIS — R809 Proteinuria, unspecified: Secondary | ICD-10-CM | POA: Diagnosis not present

## 2022-04-23 DIAGNOSIS — E1159 Type 2 diabetes mellitus with other circulatory complications: Secondary | ICD-10-CM | POA: Diagnosis not present

## 2022-04-23 DIAGNOSIS — D649 Anemia, unspecified: Secondary | ICD-10-CM | POA: Diagnosis not present

## 2022-04-23 DIAGNOSIS — I700 Atherosclerosis of aorta: Secondary | ICD-10-CM | POA: Diagnosis not present

## 2022-04-23 DIAGNOSIS — I10 Essential (primary) hypertension: Secondary | ICD-10-CM | POA: Diagnosis not present

## 2022-04-23 DIAGNOSIS — Z0000 Encounter for general adult medical examination without abnormal findings: Secondary | ICD-10-CM | POA: Diagnosis not present

## 2022-04-27 DIAGNOSIS — Z1211 Encounter for screening for malignant neoplasm of colon: Secondary | ICD-10-CM | POA: Diagnosis not present

## 2022-07-07 DIAGNOSIS — E119 Type 2 diabetes mellitus without complications: Secondary | ICD-10-CM | POA: Diagnosis not present
